# Patient Record
Sex: Male | Born: 1950 | Race: White | Hispanic: No | Marital: Married
Health system: Midwestern US, Academic
[De-identification: ages and names within clinical notes are randomized; demographics above are authoritative.]

---

## 2016-07-16 MED ORDER — MIDAZOLAM 1 MG/ML IJ SOLN
1-2 mg | Freq: Once | INTRAVENOUS | 0 refills | Status: CP
Start: 2016-07-16 — End: ?

## 2016-07-16 MED ORDER — FENTANYL CITRATE (PF) 50 MCG/ML IJ SOLN
50 ug | Freq: Once | INTRAVENOUS | 0 refills | Status: CP
Start: 2016-07-16 — End: ?

## 2016-08-09 MED ORDER — LEUPROLIDE (3 MONTH) 22.5 MG IM SYKT
22.5 mg | Freq: Once | INTRAMUSCULAR | 0 refills | Status: CP
Start: 2016-08-09 — End: ?

## 2016-09-13 ENCOUNTER — Encounter: Admit: 2016-09-13 | Discharge: 2016-09-13 | Payer: MEDICARE

## 2016-09-13 DIAGNOSIS — Z794 Long term (current) use of insulin: ICD-10-CM

## 2016-09-13 DIAGNOSIS — I252 Old myocardial infarction: ICD-10-CM

## 2016-09-13 DIAGNOSIS — G4733 Obstructive sleep apnea (adult) (pediatric): ICD-10-CM

## 2016-09-13 DIAGNOSIS — J45909 Unspecified asthma, uncomplicated: ICD-10-CM

## 2016-09-13 DIAGNOSIS — J449 Chronic obstructive pulmonary disease, unspecified: ICD-10-CM

## 2016-09-13 DIAGNOSIS — I130 Hypertensive heart and chronic kidney disease with heart failure and stage 1 through stage 4 chronic kidney disease, or unspecified chronic kidney disease: ICD-10-CM

## 2016-09-13 DIAGNOSIS — E669 Obesity, unspecified: ICD-10-CM

## 2016-09-13 DIAGNOSIS — Z955 Presence of coronary angioplasty implant and graft: ICD-10-CM

## 2016-09-13 DIAGNOSIS — I214 Non-ST elevation (NSTEMI) myocardial infarction: ICD-10-CM

## 2016-09-13 DIAGNOSIS — C61 Malignant neoplasm of prostate: Principal | ICD-10-CM

## 2016-09-13 DIAGNOSIS — I2510 Atherosclerotic heart disease of native coronary artery without angina pectoris: ICD-10-CM

## 2016-09-13 DIAGNOSIS — E1122 Type 2 diabetes mellitus with diabetic chronic kidney disease: ICD-10-CM

## 2016-09-13 DIAGNOSIS — I509 Heart failure, unspecified: ICD-10-CM

## 2016-09-13 DIAGNOSIS — E039 Hypothyroidism, unspecified: ICD-10-CM

## 2016-09-13 DIAGNOSIS — C7951 Secondary malignant neoplasm of bone: ICD-10-CM

## 2016-09-13 DIAGNOSIS — R0600 Dyspnea, unspecified: ICD-10-CM

## 2016-09-13 DIAGNOSIS — Z87891 Personal history of nicotine dependence: ICD-10-CM

## 2016-09-13 DIAGNOSIS — K219 Gastro-esophageal reflux disease without esophagitis: ICD-10-CM

## 2016-09-13 DIAGNOSIS — E785 Hyperlipidemia, unspecified: ICD-10-CM

## 2016-09-13 DIAGNOSIS — Z951 Presence of aortocoronary bypass graft: ICD-10-CM

## 2016-09-13 DIAGNOSIS — N189 Chronic kidney disease, unspecified: ICD-10-CM

## 2016-09-13 DIAGNOSIS — Z7982 Long term (current) use of aspirin: ICD-10-CM

## 2016-09-13 DIAGNOSIS — M1990 Unspecified osteoarthritis, unspecified site: ICD-10-CM

## 2016-09-13 DIAGNOSIS — I10 Essential (primary) hypertension: ICD-10-CM

## 2016-09-13 DIAGNOSIS — B0229 Other postherpetic nervous system involvement: ICD-10-CM

## 2016-09-13 DIAGNOSIS — D649 Anemia, unspecified: ICD-10-CM

## 2016-09-13 DIAGNOSIS — A4902 Methicillin resistant Staphylococcus aureus infection, unspecified site: ICD-10-CM

## 2016-09-13 DIAGNOSIS — E119 Type 2 diabetes mellitus without complications: ICD-10-CM

## 2016-09-13 LAB — COMPREHENSIVE METABOLIC PANEL
Lab: 0.5 mg/dL (ref 0.3–1.2)
Lab: 0.8 mg/dL — ABNORMAL HIGH (ref 0.4–1.24)
Lab: 100 U/L (ref 25–110)
Lab: 135 MMOL/L — ABNORMAL LOW (ref 137–147)
Lab: 15 U/L (ref 7–56)
Lab: 17 U/L — ABNORMAL HIGH (ref 7–40)
Lab: 209 mg/dL — ABNORMAL HIGH (ref 70–100)
Lab: 29 MMOL/L (ref 21–30)
Lab: 4 g/dL — ABNORMAL LOW (ref 3.5–5.0)
Lab: 4.2 MMOL/L — ABNORMAL LOW (ref 3.5–5.1)
Lab: 6.7 g/dL (ref 6.0–8.0)
Lab: 7 K/UL (ref 3–12)
Lab: 9.5 mg/dL (ref 8.5–10.6)
Lab: >60 mL/min (ref >60)
Lab: >60 mL/min (ref >60)

## 2016-09-13 LAB — TESTOSTERONE,TOTAL: Lab: 20 ng/dL — ABNORMAL LOW (ref 270–1070)

## 2016-09-13 LAB — CBC AND DIFF
Lab: 0 10*3/uL (ref 0–0.20)
Lab: 4.3 M/UL — ABNORMAL LOW (ref 4.4–5.5)
Lab: 5.9 10*3/uL (ref 4.5–11.0)

## 2016-09-13 LAB — PROSTATIC SPECIFIC ANTIGEN-PSA: Lab: 0 ng/mL (ref <4.01)

## 2016-09-13 NOTE — Progress Notes
Name: Alexander Holland          MRN: [id number]      DOB: 1950/10/27      AGE: 66 y.o.   DATE OF SERVICE: 09/13/2016    Subjective:           Reason for Visit: Metastatic prostate cancer  Heme/Onc Care    Cancer Staging  Prostate cancer [facility])  Staging form: Prostate, AJCC 7th Edition  - Clinical stage from 01/31/2016: Stage IV (T3b, N1, M1b, PSA: Less than 10, Gleason 8-10) - Signed by Ross Marcus, MD on 01/31/2016       Prostate cancer (HCC)    01/05/2016 Surgery     Transurethral resection of prostate: Pathology revealed Gleason 4+5=9 adenocarcinoma of the prostate         01/17/2016 Initial Diagnosis     Prostate cancer (HCC)       01/17/2016 Imaging     MRI pelvis: Extension of prostate tumor into mesorectal fascia and seminal vesicles, pelvic lymphadenopathy, +abnormal osseous lesions concerning for metastatic disease           01/26/2016 Imaging     Bone scan: Widespread osseous metastases in axial and appendicular skeleton         01/31/2016 -  Chemotherapy     Bicalutamide 50 mg PO daily         02/19/2016 - 06/07/2016 Chemotherapy     Docetaxel 75 mg/m2 IV q3 weeks, plan for 6 cycles.  Administered without curative intent.         02/19/2016 -  Chemotherapy     Lupron 22.5 mg IM q3 months           History of Present Illness  Alexander Holland returns for ongoing management for metastatic, hormone-sensitive prostate cancer.    He has been feeling better and more like himself as he gets further from chemotherapy. His energy is significantly better than it was even a few weeks ago and notes that his peripheral edema has resolved. No residual neuropathy. Also denies any urinary frequency, hesitancy, dysuria. Does have some dyspnea on exertion but this is moderated by use of his inhalers and doesn't limit his ADLs.    He and his wife just returned from a camping trip and they have more travel planned this summer.       Review of Systems   Constitutional: Positive for fatigue.   HENT: Negative.    Eyes: Negative. Respiratory: Positive for shortness of breath.    Cardiovascular: Negative.    Gastrointestinal: Negative.    Endocrine: Negative.    Musculoskeletal: Negative.    Skin: Negative.    Allergic/Immunologic: Negative.    Neurological: Negative.    Hematological: Negative.    Psychiatric/Behavioral: Negative.      Objective:      Past medical, surgical, family, and social history have been reviewed with the patient on 09/13/2016, and confirmed accuracy of the information outlined below:  Past Medical History:   Diagnosis Date   ??? Anemia    ??? Arthritis     DJD-Hip   ??? Asthma    ??? CAD (coronary artery disease)    ??? CHF (congestive heart failure) (HCC) 08/2015   ??? Chronic renal insufficiency    ??? COPD (chronic obstructive pulmonary disease) (HCC)    ??? DM (diabetes mellitus) (HCC)     120's,130's   ??? DOE (dyspnea on exertion)    ??? Ex-smoker    ??? GERD (gastroesophageal reflux disease)    ???  HLD (hyperlipidemia)    ??? HTN (hypertension)    ??? Hypothyroidism    ??? MRSA (methicillin resistant Staphylococcus aureus)    ??? NSTEMI (non-ST elevated myocardial infarction) (HCC)     1997, 2012   ??? Obesity    ??? OSA on CPAP    ??? Postherpetic neuralgia      Past Surgical History:   Procedure Laterality Date   ??? CORONARY STENT PLACEMENT  1997   ??? CARDIAC SURGERY  2012    CABG   ??? TURP N/A 01/05/2016    CYSTOSCOPY WITH TRANSURETHRAL PROSTATECTOMY performed by Mittie Bodo, MD at [facility]   ??? ABDOMEN SURGERY      Umbilical Hernia   ??? HX HEART CATHETERIZATION     ??? LEFT HEART CATHETERIZATION      s/p PCI ~1995     No family history on file.  Social History     Social History   ??? Marital status: Married     Spouse name: N/A   ??? Number of children: N/A   ??? Years of education: N/A     Occupational History   ??? Not on file.     Social History Main Topics   ??? Smoking status: Former Smoker     Packs/day: 1.00     Years: 30.00     Types: Cigarettes     Quit date: 02/01/1996   ??? Smokeless tobacco: Never Used   ??? Alcohol use Yes Comment: rarely   ??? Drug use: No   ??? Sexual activity: Not on file     Other Topics Concern   ??? Not on file     Social History Narrative   ??? No narrative on file   Mr. Maselli is married and lives with his wife. 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 They have 3 children (2 daughters, 1 son).        ??? ACCU-CHEK SOFTCLIX LANCETS MISC Use  as directed twice daily.     ??? acetaminophen (TYLENOL) 500 mg tablet Take 1-2 Tabs by mouth every 6 hours as needed for Pain.   ??? albuterol (VENTOLIN HFA, PROAIR HFA) 90 mcg/actuation inhaler Inhale 2 Puffs by mouth every 6 hours as needed.     ??? albuterol 0.083% (PROVENTIL; VENTOLIN) 2.5 mg /3 mL (0.083 %) nebulizer solution Inhale 3 mL solution by nebulizer as directed every 4 hours as needed for Wheezing or Shortness of Breath.   ??? aspirin EC 81 mg tablet Take 81 mg by mouth at bedtime daily. Take with food.   ??? atorvastatin (LIPITOR) 80 mg tablet Take 80 mg by mouth at bedtime daily.   ??? blood sugar diagnostic (ACCU-CHEK AVIVA PLUS) test strip 1 Strip by Test route before meals and at bedtime.     ??? carvedilol (COREG) 12.5 mg tablet Take 1 Tab by mouth twice daily. Take with food. (Patient taking differently: Take 6.25 mg by mouth twice daily. Take with food.)   ??? cholecalciferol (VITAMIN D-3) 1,000 units tablet Take 1,000 Units by mouth twice daily.   ??? insulin aspart (NOVOLOG) 100 unit/mL flexPEN Inject 10 Units into area(s) as directed three times daily with meals. (Patient taking differently: Inject 21-28 Units under the skin twice daily. Injecting 21 units in the morning and 28 units in the evening. Does not eat lunch, does not inject at noon)   ??? insulin glargine (LANTUS SOLOSTAR) 100 unit/mL (3 mL) injection PEN Inject 68 Units  into area(s) as directed at bedtime daily. (Patient taking differently: Inject 81 Units under the skin at bedtime daily.)   ??? levothyroxine (SYNTHROID) 25 mcg tablet Take 25 mcg by mouth daily. Before breakfast    ??? lisinopril (PRINIVIL, ZESTRIL) 2.5 mg tablet Take 1 tablet by mouth daily.   ??? metFORMIN (GLUCOPHAGE) 1,000 mg tablet Take 1,000 mg by mouth twice daily with meals.     ??? nitroglycerin (NITROSTAT) 0.4 mg tablet Place 0.4 mg under tongue every 5 minutes as needed.     ??? omeprazole DR(+) (PRILOSEC) 20 mg capsule Take 20 mg by mouth daily.     ??? ondansetron (ZOFRAN) 8 mg tablet Take 1 tablet by mouth every 8 hours as needed for Nausea or Vomiting. May cause constipation   ??? sertraline (ZOLOFT) 50 mg tablet Take 1 tablet by mouth daily.     Vitals:    09/13/16 1002   BP: 129/56   Temp: 36.9 ???C (98.5 ???F)   TempSrc: Oral   SpO2: 95%   Weight: 105.1 kg (231 lb 12.8 oz)   Height: 170.2 cm (67.01)     Body mass index is 36.3 kg/m???.   Pain Score: Zero   Pain Addressed:  Patient declines intervention    Patient Evaluated for a Clinical Trial: No treatment clinical trial available for this patient.     [place] Cooperative Oncology Group performance status is 1, Restricted in physically strenuous activity but ambulatory and able to carry out work of a light or sedentary nature, e.g., light house work, office work.     Physical Exam    Constitutional: Well-developed, well-nourished gentleman sitting comfortably in exam room. Appears comfortable.   Eyes: EOMI, no conjunctival injection, anicteric sclerae  ENT: Nares patent, lips and oral mucosae moist, no exudates or ulcerations. Good dentition. No stridor.  CV: Normal rate, regular rhythm, no murmur, rub, or gallop. Trace bilateral bilateral pitting edema back to trace bilaterally.   Respiratory: Normal work of breathing on room air. Good air movement throughout chest, no wheeze, rales, or rhonchi.  GI: Abdomen obese, soft, and nondistended. Bowel sounds present in all 4 quadrants. No tenderness to palpation. No hepatomegaly, spleen tip not palpated.  GU: No catheter present  Msk: Normal muscle bulk and tone. No joint deformities or arthritic abnormalities. Trace pitting edema BLE to mid-shin, symmetric.  Neuro: Cranial nerves grossly intact and symmetric. No focal neurologic deficits. Normal gait.  Integument/skin: No rash visualized nor any skin eruptions palpated.  Heme/lymph: No pathologic-appearing bruising. No cervical or supraclavicular lymphdenopathy.  Psych: Alert and oriented to person, place, date, and situation. Full affect. Good insight and judgement.   Access: Prior port-a-cath site in R chest with 0.2 cm eschar on lateral margin, otherwise well-healed    Data:  I have reviewed the patient's CBC/differential and compared to prior values. The full CBC is as follows:  CBC with Diff Latest Ref Rng & Units 09/13/2016 08/09/2016 06/28/2016 06/07/2016 05/17/2016   WBC 4.5 - 11.0 K/UL 5.9 4.7 7.1 6.1 6.0   RBC 4.4 - 5.5 M/UL 4.32(L) 3.91(L) 3.72(L) 3.62(L) 3.93(L)   HGB 13.5 - 16.5 GM/DL 11.2(L) 10.5(L) 9.7(L) 9.8(L) 10.3(L)   HCT 40 - 50 % 35.2(L) 32.3(L) 31.2(L) 30.1(L) 32.4(L)   MCV 80 - 100 FL 81.4 82.5 83.7 83.2 82.5   MCH 26 - 34 PG 26.0 26.7 26.2 27.2 26.3   MCHC 32.0 - 36.0 G/DL 31.9(L) 32.4 31.3(L) 32.7 31.9(L)   RDW 11 - 15 % 18.1(H) 18.0(H)  20.8(H) 22.4(H) 22.2(H)   PLT 150 - 400 K/UL 171 165 237 195 228   MPV 7 - 11 FL 8.0 7.9 7.6 7.7 7.2   NEUT 41 - 77 % 67 - 73 92(H) 92(H)   ANC 1.8 - 7.0 K/UL 3.90 - 5.20 5.60 5.60   LYMA 24 - 44 % 16(L) - 15(L) 7(L) 6(L)   ALYM 1.0 - 4.8 K/UL 1.00 - 1.10 0.40(L) 0.40(L)   MONA 4 - 12 % 8 - 10 1(L) 1(L)   AMONO 0 - 0.80 K/UL 0.50 - 0.70 0.10 0.10   EOSA 0 - 5 % 8(H) - 1 0 0   AEOS 0 - 0.45 K/UL 0.40 - 0.00 0.00 0.00   BASA 0 - 2 % 1 - 1 0 1   ABAS 0 - 0.20 K/UL 0.00 - 0.10 0.00 0.00       I have reviewed the patient's CMP and compared to prior values. The full CMP is as follows:  CMP Latest Ref Rng & Units 09/13/2016 08/09/2016 06/28/2016 06/07/2016 05/29/2016   NA 137 - 147 MMOL/L 135(L) 139 138 139 139   K 3.5 - 5.1 MMOL/L 4.2 3.7 4.2 4.5 4.6   CL 98 - 110 MMOL/L 99 102 100 102 100 CO2 21 - 30 MMOL/L 29 29 32(H) 25 28.0   GAP 3 - 12 7 8 6 12  16(H)   BUN 7 - 25 MG/DL 21 21 23 20  28.0(H)   CR 0.4 - 1.24 MG/DL 1.61 0.96 0.45 4.09 8.11   GLUX 70 - 100 MG/DL 914(N) 829(F) 621(H) 086(V) 137(H)   CA 8.5 - 10.6 MG/DL 9.5 9.0 9.3 8.9 9.3   TP 6.0 - 8.0 G/DL 6.7 6.5 6.2 6.1 -   ALB 3.5 - 5.0 G/DL 4.0 3.6 3.5 3.5 -   ALKP 25 - 110 U/L 100 103 84 94 -   ALT 7 - 56 U/L 15 14 9 14  -   TBILI 0.3 - 1.2 MG/DL 0.5 0.4 0.5 0.5 -   GFR >60 mL/min >60 >60 >60 >60 69.0   GFRAA >60 mL/min >60 >60 >60 >60 79.8     Lab Results   Component Value Date    PSA 0.06 09/13/2016    PSA 0.05 08/09/2016    PSA  06/28/2016     <0.10  Reference range: <=4.5  Unit: ng/mL  [facility], [address], [place] [address]      PSA 0.32 04/26/2016    PSA 4.52 (H) 02/19/2016    TESTOSTER 20 (L) 09/13/2016    TESTOSTER 23 (L) 08/09/2016    TESTOSTER <10 (L) 06/28/2016         Assessment and Plan: Mr. Kanitz is a 66 y.o. gentleman with history of coronary artery disease who presents regarding evaluation and management of metastatic prostate cancer.  1. Prostate cancer:  ???Continues to have an excellent biochemical response to combination chemohormonal therapy, PSA is 0.06, which is stable from recent values.  ???Continue q 6 week follow up through next Lupron dose on 11/01/16, after which we will continue to follow him q3 months with labs and clinical follow up.  ???I discussed my recommendations for supportive measures while receiving androgen-deprivation therapy: 1) Begin taking calcium and vitamin D at a dose of at least 500 mg of calcium per day, 2) begin weightbearing aerobic exercise (e.g. walking, jogging) as tolerated with an eventual goal of 30-45 minutes 3 times per week, and 3) begin strengthening exercises (e.g.  yoga, pilates, cautious use of weights) as tolerated with an eventual goal of 30 minutes 2 times per week. Recommendations about exercise are made with the goal of counteracting the negative effects of ADT on bone health, muscle bulk, and mood changes.  ???I informed the patient that supportive care exists for the management of sexual side effects of androgen-deprivation therapy, and informed him that my onco-urology colleagues have the greatest wealth of expertise on this topic. Offered to assist with scheduling a follow-up consultation with urology about sexual dysfunction whenever/if ever the patient would find it helpful.     2. Decrement in LVEF:  ???Following with cardiology, who is optimizing his medical management regimen for his CHF.     3. Fatigue:  ???Improving, now grade 1. Continue to monitor.    4. Goals of care:  ???Stated goal is longevity.  He stated that he is willing to accept the risk of medical burden (new symptoms, hospitalizations) in pursuit of this goal.        Total face-to-face time for visit 17 minutes, all of which was spent education and counseling with patient in clinic. Discussed the patient's cancer diagnosis and management.

## 2016-10-24 ENCOUNTER — Ambulatory Visit: Admit: 2016-10-24 | Discharge: 2016-10-25 | Payer: MEDICARE

## 2016-10-24 ENCOUNTER — Encounter: Admit: 2016-10-24 | Discharge: 2016-10-24 | Payer: MEDICARE

## 2016-10-24 DIAGNOSIS — B0229 Other postherpetic nervous system involvement: ICD-10-CM

## 2016-10-24 DIAGNOSIS — D649 Anemia, unspecified: ICD-10-CM

## 2016-10-24 DIAGNOSIS — J45909 Unspecified asthma, uncomplicated: ICD-10-CM

## 2016-10-24 DIAGNOSIS — E785 Hyperlipidemia, unspecified: ICD-10-CM

## 2016-10-24 DIAGNOSIS — N189 Chronic kidney disease, unspecified: ICD-10-CM

## 2016-10-24 DIAGNOSIS — A4902 Methicillin resistant Staphylococcus aureus infection, unspecified site: ICD-10-CM

## 2016-10-24 DIAGNOSIS — Z87891 Personal history of nicotine dependence: ICD-10-CM

## 2016-10-24 DIAGNOSIS — I509 Heart failure, unspecified: ICD-10-CM

## 2016-10-24 DIAGNOSIS — I214 Non-ST elevation (NSTEMI) myocardial infarction: ICD-10-CM

## 2016-10-24 DIAGNOSIS — M1990 Unspecified osteoarthritis, unspecified site: ICD-10-CM

## 2016-10-24 DIAGNOSIS — K219 Gastro-esophageal reflux disease without esophagitis: ICD-10-CM

## 2016-10-24 DIAGNOSIS — J449 Chronic obstructive pulmonary disease, unspecified: ICD-10-CM

## 2016-10-24 DIAGNOSIS — E119 Type 2 diabetes mellitus without complications: ICD-10-CM

## 2016-10-24 DIAGNOSIS — E039 Hypothyroidism, unspecified: ICD-10-CM

## 2016-10-24 DIAGNOSIS — R0600 Dyspnea, unspecified: ICD-10-CM

## 2016-10-24 DIAGNOSIS — I2510 Atherosclerotic heart disease of native coronary artery without angina pectoris: Principal | ICD-10-CM

## 2016-10-24 DIAGNOSIS — I10 Essential (primary) hypertension: ICD-10-CM

## 2016-10-24 DIAGNOSIS — E669 Obesity, unspecified: ICD-10-CM

## 2016-10-24 DIAGNOSIS — R0602 Shortness of breath: Principal | ICD-10-CM

## 2016-10-24 DIAGNOSIS — G4733 Obstructive sleep apnea (adult) (pediatric): ICD-10-CM

## 2016-10-24 NOTE — Progress Notes
Date of Service: 10/24/2016    Alexander Holland is a 67 y.o. male.       HPI     I saw 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  His ejection fraction has improved from 30% to more than 40% on our last imaging data.  He is on Coreg therapy and guideline-mediated therapy.  He is not complaining of orthopnea, PND, edema, palpitations, lightheadedness, syncope or chest pain     He is planning a kayaking trip in the near future, and although he has sleep apnea, he sleeps well otherwise.  He tells me that he is well-adapted to taking his numerous medications and is very compliant and adherent.    ([id number])           Vitals:    10/24/16 1005   BP: 126/64   Pulse: 70   Weight: 106.1 kg (234 lb)   Height: 1.702 m (5' 7)     Body mass index is 36.65 kg/m???.     Past Medical History  Patient Active Problem List    Diagnosis Date Noted   ??? HFrEF (heart failure with reduced ejection fraction) (HCC) 06/20/2016   ??? Edema 06/20/2016   ??? Prostate cancer (HCC) 01/17/2016     -  Gleason 4+5=9 prostate cancer on TURP from 01/05/2016.  MRI with bulky pelvic metastatic disease    ABDOMEN:  1. Normal sized upper abdominal lymph nodes, which are nonspecific and could represent reactive lymph nodes or nodal metastatic disease.  2. Extensive osseous lesions throughout the thoracolumbar spine, compatible with osseous metastatic disease. Correlate with ordered bone scan to evaluate extent of disease.    PELVIS:  1. Complete replacement of the prostate gland by tumor with extraprostatic extension to the mesorectal fascia and the seminal vesicles. Nodular tumor extends to abut the bladder base.  2. Extensive of pelvic lymphadenopathy compatible with nodal metastatic disease.  3. Extensive osseous metastatic disease. ???      Bone scan with diffuse metastatic disease:    Widespread osseous metastatic disease.     ??? BPH (benign prostatic hyperplasia) 01/05/2016 ??? BPH with obstruction/lower urinary tract symptoms 10/18/2015     10/18/15 for benign prostatic hypertrophy.  Currently on Flomax and finasteride with an episode of urinary retention in May.  He has been on catheter dependent since that time.  Currently being evaluated with transrectal ultrasound at the [facility] on July 12.  Large prostate approximately 45 g.  Last PSA was approximately 2    TRUS volume at the [place] which was 37g.     UDS (12/14/15): small capacity, retention; poor compliance and BOO.    01/05/16 bipolar TURP     ??? Respiratory failure (HCC) 02/15/2011   ??? Sepsis(995.91) 02/15/2011   ??? Pneumonia 02/15/2011   ??? HTN (hypertension) 02/02/2011   ??? HLD (hyperlipidemia) 02/02/2011   ??? CAD (coronary artery disease) 02/02/2011   ??? NSTEMI (non-ST elevated myocardial infarction) (HCC) 02/02/2011   ??? DM type 2 (diabetes mellitus, type 2) (HCC) 02/02/2011   ??? Hypothyroidism 02/02/2011         Review of Systems   Constitution: Negative.   HENT: Positive for tinnitus.    Eyes: Negative.    Cardiovascular: Positive for dyspnea on exertion.   Respiratory: Positive for shortness of breath.    Endocrine: Negative.    Hematologic/Lymphatic: Negative.    Skin: Negative.    Musculoskeletal: Negative.    Gastrointestinal: Negative.    Genitourinary: Negative.  Neurological: Negative.    Psychiatric/Behavioral: Negative.    Allergic/Immunologic: Positive for environmental allergies.       Physical Exam  Examination reveals an obese 66 year old gentleman in no acute distress.  His head and neck exam is benign.  He is slightly hard of hearing.  Lung sounds are clear without rhonchi, rales, wheezes or forced expiratory wheezing.  Heart tones are regular.  I do not detect a pathologic murmur or gallop.  Abdomen is obese.  He has no peripheral edema, cyanosis, or clubbing.  Neurologic exam is grossly intact.  Skin exam is benign.  Affect is normal.  Gait is normal.  Muscle strength is normal.    ([id number]) Cardiovascular Studies  A 12-lead electrocardiogram shows normal sinus rhythm.    ([id number])        Problems Addressed Today  No diagnosis found.    Assessment and Plan     In summary, this is a 66 year old gentleman, an insulin-dependent diabetic, with post chemotherapy heart failure cardiomyopathic process.  Doing well on his current medical regimen.  I am going to probably recheck his ejection fraction when he returns to see [place] in November.  Otherwise, I have left his meds unchanged.    ([id number])             Current Medications (including today's revisions)  ??? ACCU-CHEK SOFTCLIX LANCETS MISC Use  as directed twice daily.     ??? acetaminophen (TYLENOL) 500 mg tablet Take 1-2 Tabs by mouth every 6 hours as needed for Pain.   ??? albuterol (VENTOLIN HFA, PROAIR HFA) 90 mcg/actuation inhaler Inhale 2 Puffs by mouth every 6 hours as needed.     ??? albuterol 0.083% (PROVENTIL; VENTOLIN) 2.5 mg /3 mL (0.083 %) nebulizer solution Inhale 3 mL solution by nebulizer as directed every 4 hours as needed for Wheezing or Shortness of Breath.   ??? aspirin EC 81 mg tablet Take 81 mg by mouth at bedtime daily. Take with food.   ??? atorvastatin (LIPITOR) 80 mg tablet Take 80 mg by mouth at bedtime daily.   ??? blood sugar diagnostic (ACCU-CHEK AVIVA PLUS) test strip 1 Strip by Test route before meals and at bedtime.     ??? carvedilol (COREG) 12.5 mg tablet Take 1 Tab by mouth twice daily. Take with food. (Patient taking differently: Take 6.25 mg by mouth twice daily. Take with food.)   ??? cholecalciferol (VITAMIN D-3) 1,000 units tablet Take 1,000 Units by mouth twice daily.   ??? insulin aspart (NOVOLOG) 100 unit/mL flexPEN Inject 10 Units into area(s) as directed three times daily with meals. (Patient taking differently: Inject 21-28 Units under the skin twice daily. Injecting 21 units in the morning and 28 units in the evening. Does not eat lunch, does not inject at noon) ??? insulin glargine (LANTUS SOLOSTAR) 100 unit/mL (3 mL) injection PEN Inject 68 Units into area(s) as directed at bedtime daily. (Patient taking differently: Inject 81 Units under the skin at bedtime daily.)   ??? levothyroxine (SYNTHROID) 25 mcg tablet Take 25 mcg by mouth daily. Before breakfast    ??? lisinopril (PRINIVIL, ZESTRIL) 2.5 mg tablet Take 1 tablet by mouth daily.   ??? metFORMIN (GLUCOPHAGE) 1,000 mg tablet Take 1,000 mg by mouth twice daily with meals.     ??? nitroglycerin (NITROSTAT) 0.4 mg tablet Place 0.4 mg under tongue every 5 minutes as needed.     ??? omeprazole DR(+) (PRILOSEC) 20 mg capsule Take 20 mg by mouth daily.     ???  ondansetron (ZOFRAN) 8 mg tablet Take 1 tablet by mouth every 8 hours as needed for Nausea or Vomiting. May cause constipation   ??? sertraline (ZOLOFT) 50 mg tablet Take 1 tablet by mouth daily.

## 2016-10-31 ENCOUNTER — Encounter: Admit: 2016-10-31 | Discharge: 2016-10-31 | Payer: MEDICARE

## 2016-10-31 DIAGNOSIS — C61 Malignant neoplasm of prostate: Principal | ICD-10-CM

## 2016-11-01 ENCOUNTER — Encounter: Admit: 2016-11-01 | Discharge: 2016-11-01 | Payer: MEDICARE

## 2016-11-01 DIAGNOSIS — I130 Hypertensive heart and chronic kidney disease with heart failure and stage 1 through stage 4 chronic kidney disease, or unspecified chronic kidney disease: ICD-10-CM

## 2016-11-01 DIAGNOSIS — Z87891 Personal history of nicotine dependence: ICD-10-CM

## 2016-11-01 DIAGNOSIS — N189 Chronic kidney disease, unspecified: ICD-10-CM

## 2016-11-01 DIAGNOSIS — E1122 Type 2 diabetes mellitus with diabetic chronic kidney disease: ICD-10-CM

## 2016-11-01 DIAGNOSIS — C775 Secondary and unspecified malignant neoplasm of intrapelvic lymph nodes: ICD-10-CM

## 2016-11-01 DIAGNOSIS — C7951 Secondary malignant neoplasm of bone: ICD-10-CM

## 2016-11-01 DIAGNOSIS — I509 Heart failure, unspecified: ICD-10-CM

## 2016-11-01 DIAGNOSIS — J449 Chronic obstructive pulmonary disease, unspecified: ICD-10-CM

## 2016-11-01 DIAGNOSIS — R5383 Other fatigue: ICD-10-CM

## 2016-11-01 DIAGNOSIS — I2510 Atherosclerotic heart disease of native coronary artery without angina pectoris: ICD-10-CM

## 2016-11-01 DIAGNOSIS — Z79818 Long term (current) use of other agents affecting estrogen receptors and estrogen levels: ICD-10-CM

## 2016-11-01 DIAGNOSIS — C61 Malignant neoplasm of prostate: Principal | ICD-10-CM

## 2016-11-01 DIAGNOSIS — E785 Hyperlipidemia, unspecified: ICD-10-CM

## 2016-11-01 LAB — COMPREHENSIVE METABOLIC PANEL
Lab: 0.5 mg/dL (ref 0.3–1.2)
Lab: 1.1 mg/dL — ABNORMAL HIGH (ref 0.4–1.24)
Lab: 10 K/UL — ABNORMAL LOW (ref 3–12)
Lab: 104 U/L (ref 25–110)
Lab: 136 MMOL/L — ABNORMAL LOW (ref 137–147)
Lab: 21 U/L (ref 7–56)
Lab: 22 U/L — ABNORMAL HIGH (ref 7–40)
Lab: 26 mg/dL — ABNORMAL HIGH (ref 7–25)
Lab: 260 mg/dL — ABNORMAL HIGH (ref 70–100)
Lab: 27 MMOL/L (ref 21–30)
Lab: 4 g/dL — ABNORMAL LOW (ref 3.5–5.0)
Lab: 4.7 MMOL/L — ABNORMAL LOW (ref 3.5–5.1)
Lab: 6.8 g/dL (ref 6.0–8.0)
Lab: 9.6 mg/dL (ref 8.5–10.6)
Lab: >60 mL/min (ref >60)
Lab: >60 mL/min — ABNORMAL HIGH (ref >60)

## 2016-11-01 LAB — PROSTATIC SPECIFIC ANTIGEN-PSA: Lab: 0 ng/mL (ref <4.01)

## 2016-11-01 LAB — CBC AND DIFF
Lab: 0.1 10*3/uL (ref 0–0.20)
Lab: 4.3 M/UL — ABNORMAL LOW (ref 4.4–5.5)
Lab: 5.5 10*3/uL (ref 4.5–11.0)

## 2016-11-01 MED ORDER — LEUPROLIDE (3 MONTH) 22.5 MG IM SYKT
22.5 mg | Freq: Once | INTRAMUSCULAR | 0 refills | Status: CN
Start: 2016-11-01 — End: ?

## 2016-11-01 MED ORDER — LEUPROLIDE (3 MONTH) 22.5 MG IM SYKT
22.5 mg | Freq: Once | INTRAMUSCULAR | 0 refills | Status: CP
Start: 2016-11-01 — End: ?
  Administered 2016-11-01: 18:00:00 22.5 mg via INTRAMUSCULAR

## 2016-11-01 NOTE — Progress Notes
Name: Alexander Holland          MRN: [id number]      DOB: 02-08-1951      AGE: 66 y.o.   DATE OF SERVICE: 11/01/2016    Subjective:           Reason for Visit: Metastatic prostate cancer  Heme/Onc Care    Cancer Staging  Prostate cancer [facility])  Staging form: Prostate, AJCC 7th Edition  - Clinical stage from 01/31/2016: Stage IV (T3b, N1, M1b, PSA: Less than 10, Gleason 8-10) - Signed by Ross Marcus, MD on 01/31/2016       Prostate cancer (HCC)    01/05/2016 Surgery     Transurethral resection of prostate: Pathology revealed Gleason 4+5=9 adenocarcinoma of the prostate         01/17/2016 Initial Diagnosis     Prostate cancer (HCC)       01/17/2016 Imaging     MRI pelvis: Extension of prostate tumor into mesorectal fascia and seminal vesicles, pelvic lymphadenopathy, +abnormal osseous lesions concerning for metastatic disease           01/26/2016 Imaging     Bone scan: Widespread osseous metastases in axial and appendicular skeleton         01/31/2016 -  Chemotherapy     Bicalutamide 50 mg PO daily         02/19/2016 - 06/07/2016 Chemotherapy     Docetaxel 75 mg/m2 IV q3 weeks, plan for 6 cycles.  Administered without curative intent.         02/19/2016 -  Chemotherapy     Lupron 22.5 mg IM q3 months           History of Present Illness  [name] returns for ongoing management for metastatic, hormone-sensitive prostate cancer.    He says that he has been feeling quite well lately, dramatically better than when he was receiving chemotherapy.  His energy is improved and dyspnea on exertion is minimal now.  He has been working on getting a kayak ready to take a small boat trip on the [place], although his family is not enthusiastic about him doing so, as they worried he might get hurt.  He has been going fishing and enjoying his time, even taking his grandchildren out and teaching them to fish, which was very meaningful to him.    He denies any significant pain and denies any urinary frequency, urgency, or hesitancy.  He has not noticed any hot flashes or night sweats, and his mood has been good.       Review of Systems   Constitutional: Positive for fatigue.   HENT: Negative.    Eyes: Negative.    Respiratory: Positive for shortness of breath.    Cardiovascular: Negative.    Gastrointestinal: Negative.    Endocrine: Negative.    Musculoskeletal: Negative.    Skin: Negative.    Allergic/Immunologic: Negative.    Neurological: Negative.    Hematological: Negative.    Psychiatric/Behavioral: Negative.      Objective:      Past medical, surgical, family, and social history have been reviewed with the patient on 11/01/2016, and confirmed accuracy of the information outlined below:  Past Medical History:   Diagnosis Date   ??? Anemia    ??? Arthritis     DJD-Hip   ??? Asthma    ??? CAD (coronary artery disease)    ??? CHF (congestive heart failure) (HCC) 08/2015   ??? Chronic renal insufficiency    ???  COPD (chronic obstructive pulmonary disease) (HCC)    ??? DM (diabetes mellitus) (HCC)     120's,130's   ??? DOE (dyspnea on exertion)    ??? Ex-smoker    ??? GERD (gastroesophageal reflux disease)    ??? HLD (hyperlipidemia)    ??? HTN (hypertension)    ??? Hypothyroidism    ??? MRSA (methicillin resistant Staphylococcus aureus)    ??? NSTEMI (non-ST elevated myocardial infarction) (HCC)     1997, 2012   ??? Obesity    ??? OSA on CPAP    ??? Postherpetic neuralgia      Past Surgical History:   Procedure Laterality Date   ??? CORONARY STENT PLACEMENT  1997   ??? CARDIAC SURGERY  2012    CABG   ??? TURP N/A 01/05/2016    CYSTOSCOPY WITH TRANSURETHRAL PROSTATECTOMY performed by Mittie Bodo, MD at [facility]   ??? ABDOMEN SURGERY      Umbilical Hernia   ??? HX HEART CATHETERIZATION     ??? LEFT HEART CATHETERIZATION      s/p PCI ~1995     No family history on file.  Social History     Social History   ??? Marital status: Married     Spouse name: N/A   ??? Number of children: N/A   ??? Years of education: N/A     Occupational History   ??? Not on file. Social History Main Topics   ??? Smoking status: Former Smoker     Packs/day: 1.00     Years: 30.00     Types: Cigarettes     Quit date: 02/01/1996   ??? Smokeless tobacco: Never Used   ??? Alcohol use Yes      Comment: rarely   ??? Drug use: No   ??? Sexual activity: Not on file     Other Topics Concern   ??? Not on file     Social History Narrative   ??? No narrative on file   [name] is married and lives with his wife. 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 They have 3 children (2 daughters, 1 son).        ??? ACCU-CHEK SOFTCLIX LANCETS MISC Use  as directed twice daily.     ??? acetaminophen (TYLENOL) 500 mg tablet Take 1-2 Tabs by mouth every 6 hours as needed for Pain.   ??? albuterol (VENTOLIN HFA, PROAIR HFA) 90 mcg/actuation inhaler Inhale 2 Puffs by mouth every 6 hours as needed.     ??? albuterol 0.083% (PROVENTIL; VENTOLIN) 2.5 mg /3 mL (0.083 %) nebulizer solution Inhale 3 mL solution by nebulizer as directed every 4 hours as needed for Wheezing or Shortness of Breath.   ??? aspirin EC 81 mg tablet Take 81 mg by mouth at bedtime daily. Take with food.   ??? atorvastatin (LIPITOR) 80 mg tablet Take 80 mg by mouth at bedtime daily.   ??? blood sugar diagnostic (ACCU-CHEK AVIVA PLUS) test strip 1 Strip by Test route before meals and at bedtime.     ??? carvedilol (COREG) 12.5 mg tablet Take 1 Tab by mouth twice daily. Take with food. (Patient taking differently: Take 6.25 mg by mouth twice daily. Take with food.)   ??? cholecalciferol (VITAMIN D-3) 1,000 units tablet Take 1,000 Units by mouth twice daily.   ??? insulin aspart (NOVOLOG) 100 unit/mL flexPEN Inject 10 Units into area(s) as directed three times daily with meals. (Patient taking differently: Inject  21-28 Units under the skin twice daily. Injecting 21 units in the morning and 28 units in the evening. Does not eat lunch, does not inject at noon)   ??? insulin glargine (LANTUS SOLOSTAR) 100 unit/mL (3 mL) injection PEN Inject 68 Units into area(s) as directed at bedtime daily. (Patient taking differently: Inject 81 Units under the skin at bedtime daily.)   ??? levothyroxine (SYNTHROID) 25 mcg tablet Take 25 mcg by mouth daily. Before breakfast    ??? lisinopril (PRINIVIL, ZESTRIL) 2.5 mg tablet Take 1 tablet by mouth daily.   ??? metFORMIN (GLUCOPHAGE) 1,000 mg tablet Take 1,000 mg by mouth twice daily with meals.     ??? nitroglycerin (NITROSTAT) 0.4 mg tablet Place 0.4 mg under tongue every 5 minutes as needed.     ??? omeprazole DR(+) (PRILOSEC) 20 mg capsule Take 20 mg by mouth daily.     ??? ondansetron (ZOFRAN) 8 mg tablet Take 1 tablet by mouth every 8 hours as needed for Nausea or Vomiting. May cause constipation   ??? sertraline (ZOLOFT) 50 mg tablet Take 1 tablet by mouth daily.     Vitals:    11/01/16 1140   BP: 108/56   Pulse: 72   Resp: 16   Temp: 37.1 ???C (98.7 ???F)   TempSrc: Oral   SpO2: 94%   Weight: 107.4 kg (236 lb 12.8 oz)   Height: 169.4 cm (66.69)     Body mass index is 37.43 kg/m???.   Pain Score: Zero   Pain Addressed:  Patient declines intervention    Patient Evaluated for a Clinical Trial: No treatment clinical trial available for this patient.     [place] Cooperative Oncology Group performance status is 1, Restricted in physically strenuous activity but ambulatory and able to carry out work of a light or sedentary nature, e.g., light house work, office work.     Physical Exam  Constitutional: Well-developed, well-nourished gentleman sitting comfortably in exam room. Appears comfortable.   Eyes: EOMI, no conjunctival injection, anicteric sclerae  ENT: Nares patent, lips and oral mucosae moist, no exudates or ulcerations. Good dentition. No stridor.  CV: Normal rate, regular rhythm, no murmur, rub, or gallop. Trace bilateral bilateral pitting edema back to trace bilaterally.   Respiratory: Normal work of breathing on room air. Good air movement throughout chest, no wheeze, rales, or rhonchi. GI: Abdomen obese, soft, and nondistended. Bowel sounds present in all 4 quadrants. No tenderness to palpation. No hepatomegaly, spleen tip not palpated.  GU: No catheter present  Msk: Normal muscle bulk and tone. No joint deformities or arthritic abnormalities. Trace pitting edema BLE (stable)  Neuro: Cranial nerves grossly intact and symmetric. No focal neurologic deficits. Normal gait.  Integument/skin: No rash visualized nor any skin eruptions palpated.  Heme/lymph: No pathologic-appearing bruising. No cervical or supraclavicular lymphdenopathy.  Psych: Alert and oriented to person, place, date, and situation. Affect is full and content. Good insight and judgement.   Access: Prior port-a-cath site in R chest with 0.2 cm eschar on lateral margin, otherwise well-healed        Data:  I have reviewed the patient's CBC/differential and compared to prior values. The full CBC is as follows:  CBC with Diff Latest Ref Rng & Units 11/01/2016 09/13/2016 08/09/2016 06/28/2016 06/07/2016   WBC 4.5 - 11.0 K/UL 5.5 5.9 4.7 7.1 6.1   RBC 4.4 - 5.5 M/UL 4.35(L) 4.32(L) 3.91(L) 3.72(L) 3.62(L)   HGB 13.5 - 16.5 GM/DL 11.8(L) 11.2(L) 10.5(L) 9.7(L) 9.8(L)   HCT  40 - 50 % 36.3(L) 35.2(L) 32.3(L) 31.2(L) 30.1(L)   MCV 80 - 100 FL 83.3 81.4 82.5 83.7 83.2   MCH 26 - 34 PG 27.0 26.0 26.7 26.2 27.2   MCHC 32.0 - 36.0 G/DL 16.1 31.9(L) 32.4 31.3(L) 32.7   RDW 11 - 15 % 18.2(H) 18.1(H) 18.0(H) 20.8(H) 22.4(H)   PLT 150 - 400 K/UL 182 171 165 237 195   MPV 7 - 11 FL 8.0 8.0 7.9 7.6 7.7   NEUT 41 - 77 % 68 67 - 73 92(H)   ANC 1.8 - 7.0 K/UL 3.80 3.90 - 5.20 5.60   LYMA 24 - 44 % 14(L) 16(L) - 15(L) 7(L)   ALYM 1.0 - 4.8 K/UL 0.80(L) 1.00 - 1.10 0.40(L)   MONA 4 - 12 % 8 8 - 10 1(L)   AMONO 0 - 0.80 K/UL 0.40 0.50 - 0.70 0.10   EOSA 0 - 5 % 9(H) 8(H) - 1 0   AEOS 0 - 0.45 K/UL 0.50(H) 0.40 - 0.00 0.00   BASA 0 - 2 % 1 1 - 1 0   ABAS 0 - 0.20 K/UL 0.10 0.00 - 0.10 0.00       I have reviewed the patient's CMP and compared to prior values. The full CMP is as follows:  CMP Latest Ref Rng & Units 11/01/2016 09/13/2016 08/09/2016 06/28/2016 06/07/2016   NA 137 - 147 MMOL/L 136(L) 135(L) 139 138 139   K 3.5 - 5.1 MMOL/L 4.7 4.2 3.7 4.2 4.5   CL 98 - 110 MMOL/L 99 99 102 100 102   CO2 21 - 30 MMOL/L 27 29 29  32(H) 25   GAP 3 - 12 10 7 8 6 12    BUN 7 - 25 MG/DL 09(U) 21 21 23 20    CR 0.4 - 1.24 MG/DL 0.45 4.09 8.11 9.14 7.82   GLUX 70 - 100 MG/DL 956(O) 130(Q) 657(Q) 469(G) 280(H)   CA 8.5 - 10.6 MG/DL 9.6 9.5 9.0 9.3 8.9   TP 6.0 - 8.0 G/DL 6.8 6.7 6.5 6.2 6.1   ALB 3.5 - 5.0 G/DL 4.0 4.0 3.6 3.5 3.5   ALKP 25 - 110 U/L 104 100 103 84 94   ALT 7 - 56 U/L 21 15 14 9 14    TBILI 0.3 - 1.2 MG/DL 0.5 0.5 0.4 0.5 0.5   GFR >60 mL/min >60 >60 >60 >60 >60   GFRAA >60 mL/min >60 >60 >60 >60 >60     Lab Results   Component Value Date    PSA 0.06 11/01/2016    PSA 0.06 09/13/2016    PSA 0.05 08/09/2016    PSA  06/28/2016     <0.10  Reference range: <=4.5  Unit: ng/mL  [facility], [address], [place] [address]      PSA 0.32 04/26/2016    PSA 4.52 (H) 02/19/2016    TESTOSTER 20 (L) 09/13/2016    TESTOSTER 23 (L) 08/09/2016    TESTOSTER <10 (L) 06/28/2016          Assessment and Plan: [name] is a 66 y.o. gentleman with history of coronary artery disease who presents regarding evaluation and management of metastatic prostate cancer.  1. Prostate cancer:  ???Continues to have an excellent biochemical response to combination chemohormonal therapy (now on Lupron alone), PSA is 0.06, which is stable from recent values.  ???Will space out follow up to q3 months to coincide with Lupron injections. Will shorten interval when/if PSA starts  rising.   ???I discussed my recommendations for supportive measures while receiving androgen-deprivation therapy: 1) Begin taking calcium and vitamin D at a dose of at least 500 mg of calcium per day, 2) begin weightbearing aerobic exercise (e.g. walking, jogging) as tolerated with an eventual goal of 30-45 minutes 3 times per week, and 3) begin strengthening exercises (e.g. yoga, pilates, cautious use of weights) as tolerated with an eventual goal of 30 minutes 2 times per week. Recommendations about exercise are made with the goal of counteracting the negative effects of ADT on bone health, muscle bulk, and mood changes.  ???I informed the patient that supportive care exists for the management of sexual side effects of androgen-deprivation therapy, and informed him that my onco-urology colleagues have the greatest wealth of expertise on this topic. Offered to assist with scheduling a follow-up consultation with urology about sexual dysfunction whenever/if ever the patient would find it helpful.     2. Decrement in LVEF:  ???Following closely with cardiology, appreciate their management.    3. Fatigue:  ???Improving, now grade 1. Continue to monitor.    4. Goals of care:  ???Stated goal is longevity.  He stated that he is willing to accept the risk of medical burden (new symptoms, hospitalizations) in pursuit of this goal.        Total face-to-face time for visit 6 minutes, all of which was spent education and counseling with patient in clinic. Discussed the patient's cancer diagnosis and management.

## 2016-11-01 NOTE — Progress Notes
Patient received Lupron and tolerated without difficulty.  No pertinent changes since last assessment. Denies need for AVS and he left ambulatory.

## 2017-01-24 ENCOUNTER — Encounter: Admit: 2017-01-24 | Discharge: 2017-01-24 | Payer: MEDICARE

## 2017-01-24 DIAGNOSIS — E039 Hypothyroidism, unspecified: ICD-10-CM

## 2017-01-24 DIAGNOSIS — C775 Secondary and unspecified malignant neoplasm of intrapelvic lymph nodes: ICD-10-CM

## 2017-01-24 DIAGNOSIS — I10 Essential (primary) hypertension: ICD-10-CM

## 2017-01-24 DIAGNOSIS — E1122 Type 2 diabetes mellitus with diabetic chronic kidney disease: ICD-10-CM

## 2017-01-24 DIAGNOSIS — I130 Hypertensive heart and chronic kidney disease with heart failure and stage 1 through stage 4 chronic kidney disease, or unspecified chronic kidney disease: ICD-10-CM

## 2017-01-24 DIAGNOSIS — G4733 Obstructive sleep apnea (adult) (pediatric): ICD-10-CM

## 2017-01-24 DIAGNOSIS — J45909 Unspecified asthma, uncomplicated: ICD-10-CM

## 2017-01-24 DIAGNOSIS — N189 Chronic kidney disease, unspecified: ICD-10-CM

## 2017-01-24 DIAGNOSIS — R0600 Dyspnea, unspecified: ICD-10-CM

## 2017-01-24 DIAGNOSIS — R9721 Rising PSA following treatment for malignant neoplasm of prostate: ICD-10-CM

## 2017-01-24 DIAGNOSIS — M1990 Unspecified osteoarthritis, unspecified site: ICD-10-CM

## 2017-01-24 DIAGNOSIS — C7951 Secondary malignant neoplasm of bone: ICD-10-CM

## 2017-01-24 DIAGNOSIS — E785 Hyperlipidemia, unspecified: ICD-10-CM

## 2017-01-24 DIAGNOSIS — Z951 Presence of aortocoronary bypass graft: ICD-10-CM

## 2017-01-24 DIAGNOSIS — I2510 Atherosclerotic heart disease of native coronary artery without angina pectoris: Principal | ICD-10-CM

## 2017-01-24 DIAGNOSIS — E119 Type 2 diabetes mellitus without complications: ICD-10-CM

## 2017-01-24 DIAGNOSIS — R5383 Other fatigue: ICD-10-CM

## 2017-01-24 DIAGNOSIS — Z87891 Personal history of nicotine dependence: ICD-10-CM

## 2017-01-24 DIAGNOSIS — B0229 Other postherpetic nervous system involvement: ICD-10-CM

## 2017-01-24 DIAGNOSIS — I509 Heart failure, unspecified: ICD-10-CM

## 2017-01-24 DIAGNOSIS — K219 Gastro-esophageal reflux disease without esophagitis: ICD-10-CM

## 2017-01-24 DIAGNOSIS — A4902 Methicillin resistant Staphylococcus aureus infection, unspecified site: ICD-10-CM

## 2017-01-24 DIAGNOSIS — E669 Obesity, unspecified: ICD-10-CM

## 2017-01-24 DIAGNOSIS — J449 Chronic obstructive pulmonary disease, unspecified: ICD-10-CM

## 2017-01-24 DIAGNOSIS — Z79818 Long term (current) use of other agents affecting estrogen receptors and estrogen levels: ICD-10-CM

## 2017-01-24 DIAGNOSIS — C61 Malignant neoplasm of prostate: Principal | ICD-10-CM

## 2017-01-24 DIAGNOSIS — D649 Anemia, unspecified: ICD-10-CM

## 2017-01-24 DIAGNOSIS — I214 Non-ST elevation (NSTEMI) myocardial infarction: ICD-10-CM

## 2017-01-24 LAB — TESTOSTERONE,TOTAL: Lab: 21 ng/dL — ABNORMAL LOW (ref 270–1070)

## 2017-01-24 LAB — PROSTATIC SPECIFIC ANTIGEN-PSA: Lab: 0.1 ng/mL (ref <4.01)

## 2017-01-24 MED ORDER — LEUPROLIDE (3 MONTH) 22.5 MG IM SYKT
22.5 mg | Freq: Once | INTRAMUSCULAR | 0 refills | Status: CP
Start: 2017-01-24 — End: ?
  Administered 2017-01-24: 18:00:00 22.5 mg via INTRAMUSCULAR

## 2017-01-24 NOTE — Progress Notes
Patient received Lupron and tolerated without difficulty.  No pertinent changes since last assessment.

## 2017-01-24 NOTE — Progress Notes
Name: Alexander Holland          MRN: [id number]      DOB: 1951/01/10      AGE: 66 y.o.   DATE OF SERVICE: 01/24/2017    Subjective:           Reason for Visit: Metastatic prostate cancer  Prostate Cancer    Cancer Staging  Prostate cancer [facility])  Staging form: Prostate, AJCC 7th Edition  - Clinical stage from 01/31/2016: Stage IV (T3b, N1, M1b, PSA: Less than 10, Gleason 8-10) - Signed by Ross Marcus, MD on 01/31/2016       Prostate cancer (HCC)    01/05/2016 Surgery     Transurethral resection of prostate: Pathology revealed Gleason 4+5=9 adenocarcinoma of the prostate         01/17/2016 Initial Diagnosis     Prostate cancer (HCC)       01/17/2016 Imaging     MRI pelvis: Extension of prostate tumor into mesorectal fascia and seminal vesicles, pelvic lymphadenopathy, +abnormal osseous lesions concerning for metastatic disease           01/26/2016 Imaging     Bone scan: Widespread osseous metastases in axial and appendicular skeleton         01/31/2016 -  Chemotherapy     Bicalutamide 50 mg PO daily         02/19/2016 - 06/07/2016 Chemotherapy     Docetaxel 75 mg/m2 IV q3 weeks, plan for 6 cycles.  Administered without curative intent.         02/19/2016 -  Chemotherapy     Lupron 22.5 mg IM q3 months           History of Present Illness  Alexander Holland returns for ongoing management for metastatic, hormone-sensitive prostate cancer.    Since his last visit, Alexander Holland has been feeling pretty good. He did carry out his plan of kayaking on the river, and he went 27 miles from [place], [place] to [place] He is looking forward to doing it again.    He denies any other new or concerning symptoms. He denies any hot flashes, night sweats, or mood changes. 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 They are going to her family reunion in a few weeks.        Review of Systems   Constitutional: Positive for fatigue (minimal).   HENT: Negative. Eyes: Negative.    Respiratory: Positive for shortness of breath (on exertion, mild).    Cardiovascular: Negative.  Negative for leg swelling.   Gastrointestinal: Negative.    Endocrine: Negative.    Musculoskeletal: Negative.    Skin: Negative.    Allergic/Immunologic: Negative.    Neurological: Negative.    Hematological: Negative.    Psychiatric/Behavioral: Negative.      Objective:      Past medical, surgical, family, and social history have been reviewed with the patient on 01/24/2017, and confirmed accuracy of the information outlined below:  Past Medical History:   Diagnosis Date   ??? Anemia    ??? Arthritis     DJD-Hip   ??? Asthma    ??? CAD (coronary artery disease)    ??? CHF (congestive heart failure) (HCC) 08/2015   ??? Chronic renal insufficiency    ??? COPD (chronic obstructive pulmonary disease) (HCC)    ??? DM (diabetes mellitus) (HCC)     120's,130's   ??? DOE (  dyspnea on exertion)    ??? Ex-smoker    ??? GERD (gastroesophageal reflux disease)    ??? HLD (hyperlipidemia)    ??? HTN (hypertension)    ??? Hypothyroidism    ??? MRSA (methicillin resistant Staphylococcus aureus)    ??? NSTEMI (non-ST elevated myocardial infarction) (HCC)     1997, 2012   ??? Obesity    ??? OSA on CPAP    ??? Postherpetic neuralgia      Past Surgical History:   Procedure Laterality Date   ??? CORONARY STENT PLACEMENT  1997   ??? CARDIAC SURGERY  2012    CABG   ??? TURP N/A 01/05/2016    CYSTOSCOPY WITH TRANSURETHRAL PROSTATECTOMY performed by Mittie Bodo, MD at [facility]   ??? ABDOMEN SURGERY      Umbilical Hernia   ??? HX HEART CATHETERIZATION     ??? LEFT HEART CATHETERIZATION      s/p PCI ~1995     No family history on file.  Social History     Social History   ??? Marital status: Married     Spouse name: N/A   ??? Number of children: N/A   ??? Years of education: N/A     Occupational History   ??? Not on file.     Social History Main Topics   ??? Smoking status: Former Smoker     Packs/day: 1.00     Years: 30.00     Types: Cigarettes     Quit date: 02/01/1996 ??? Smokeless tobacco: Never Used   ??? Alcohol use Yes      Comment: rarely   ??? Drug use: No   ??? Sexual activity: Not on file     Other Topics Concern   ??? Not on file     Social History Narrative   ??? No narrative on file   Alexander Holland is married and lives with his wife. 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 They have 3 children (2 daughters, 1 son).        ??? ACCU-CHEK SOFTCLIX LANCETS MISC Use  as directed twice daily.     ??? acetaminophen (TYLENOL) 500 mg tablet Take 1-2 Tabs by mouth every 6 hours as needed for Pain.   ??? albuterol (VENTOLIN HFA, PROAIR HFA) 90 mcg/actuation inhaler Inhale 2 Puffs by mouth every 6 hours as needed.     ??? albuterol 0.083% (PROVENTIL; VENTOLIN) 2.5 mg /3 mL (0.083 %) nebulizer solution Inhale 3 mL solution by nebulizer as directed every 4 hours as needed for Wheezing or Shortness of Breath.   ??? aspirin EC 81 mg tablet Take 81 mg by mouth at bedtime daily. Take with food.   ??? atorvastatin (LIPITOR) 80 mg tablet Take 80 mg by mouth at bedtime daily.   ??? blood sugar diagnostic (ACCU-CHEK AVIVA PLUS) test strip 1 Strip by Test route before meals and at bedtime.     ??? carvedilol (COREG) 12.5 mg tablet Take 1 Tab by mouth twice daily. Take with food. (Patient taking differently: Take 6.25 mg by mouth twice daily. Take with food.)   ??? cholecalciferol (VITAMIN D-3) 1,000 units tablet Take 1,000 Units by mouth twice daily.   ??? insulin aspart (NOVOLOG) 100 unit/mL flexPEN Inject 10 Units into area(s) as directed three times daily with meals. (Patient taking differently: Inject 21-28 Units under the skin twice daily. Injecting 21 units in the morning and 28 units in the evening. Does not  eat lunch, does not inject at noon)   ??? insulin glargine (LANTUS SOLOSTAR) 100 unit/mL (3 mL) injection PEN Inject 68 Units into area(s) as directed at bedtime daily. (Patient taking differently: Inject 81 Units under the skin at bedtime daily.) ??? levothyroxine (SYNTHROID) 25 mcg tablet Take 25 mcg by mouth daily. Before breakfast    ??? lisinopril (PRINIVIL, ZESTRIL) 2.5 mg tablet Take 1 tablet by mouth daily.   ??? metFORMIN (GLUCOPHAGE) 1,000 mg tablet Take 1,000 mg by mouth twice daily with meals.     ??? nitroglycerin (NITROSTAT) 0.4 mg tablet Place 0.4 mg under tongue every 5 minutes as needed.     ??? omeprazole DR(+) (PRILOSEC) 20 mg capsule Take 20 mg by mouth daily.     ??? ondansetron (ZOFRAN) 8 mg tablet Take 1 tablet by mouth every 8 hours as needed for Nausea or Vomiting. May cause constipation   ??? sertraline (ZOLOFT) 50 mg tablet Take 1 tablet by mouth daily.     Vitals:    01/24/17 1124 01/24/17 1125   BP: 113/57    Pulse: 67    Resp: 14    Temp: 36.6 ???C (97.9 ???F)    TempSrc: Oral Oral   SpO2: 95%    Weight: 109 kg (240 lb 3.2 oz)    Height: 169.4 cm (66.69)      Body mass index is 37.97 kg/m???.   Pain Score: Zero   Pain Addressed:  Patient declines intervention    Patient Evaluated for a Clinical Trial: No treatment clinical trial available for this patient.     [place] Cooperative Oncology Group performance status is 1, Restricted in physically strenuous activity but ambulatory and able to carry out work of a light or sedentary nature, e.g., light house work, office work.     Physical Exam  Constitutional: Well-developed, well-nourished gentleman sitting comfortably in exam room. Appears comfortable.   Eyes: EOMI, no conjunctival injection, anicteric sclerae  ENT: Nares patent, lips and oral mucosae moist, no exudates or ulcerations. Good dentition. No stridor.  CV: Normal rate, regular rhythm, no murmur, rub, or gallop. Trace bilateral bilateral pitting edema back to trace bilaterally.   Respiratory: Normal work of breathing on room air. Good air movement throughout chest, no wheeze, rales, or rhonchi.  GI: Abdomen obese, soft, and nondistended. Bowel sounds present in all 4 quadrants. No tenderness to palpation. No hepatomegaly, spleen tip not palpated.  GU: No catheter present  Msk: Normal muscle bulk and tone. No joint deformities or arthritic abnormalities. Trace pitting edema BLE (stable)  Neuro: Cranial nerves grossly intact and symmetric. No focal neurologic deficits. Normal gait.  Integument/skin: No rash visualized nor any skin eruptions palpated.  Heme/lymph: No pathologic-appearing bruising. No cervical or supraclavicular lymphdenopathy.  Psych: Alert and oriented to person, place, date, and situation. Affect is full and content. Good insight and judgement.   Access: Port-a-cath site well healed        Data:  I have reviewed the patient's CBC/differential and compared to prior values. The full CBC is as follows:  CBC with Diff Latest Ref Rng & Units 11/01/2016 09/13/2016 08/09/2016 06/28/2016 06/07/2016   WBC 4.5 - 11.0 K/UL 5.5 5.9 4.7 7.1 6.1   RBC 4.4 - 5.5 M/UL 4.35(L) 4.32(L) 3.91(L) 3.72(L) 3.62(L)   HGB 13.5 - 16.5 GM/DL 11.8(L) 11.2(L) 10.5(L) 9.7(L) 9.8(L)   HCT 40 - 50 % 36.3(L) 35.2(L) 32.3(L) 31.2(L) 30.1(L)   MCV 80 - 100 FL 83.3 81.4 82.5 83.7 83.2  MCH 26 - 34 PG 27.0 26.0 26.7 26.2 27.2   MCHC 32.0 - 36.0 G/DL 16.0 31.9(L) 32.4 31.3(L) 32.7   RDW 11 - 15 % 18.2(H) 18.1(H) 18.0(H) 20.8(H) 22.4(H)   PLT 150 - 400 K/UL 182 171 165 237 195   MPV 7 - 11 FL 8.0 8.0 7.9 7.6 7.7   NEUT 41 - 77 % 68 67 - 73 92(H)   ANC 1.8 - 7.0 K/UL 3.80 3.90 - 5.20 5.60   LYMA 24 - 44 % 14(L) 16(L) - 15(L) 7(L)   ALYM 1.0 - 4.8 K/UL 0.80(L) 1.00 - 1.10 0.40(L)   MONA 4 - 12 % 8 8 - 10 1(L)   AMONO 0 - 0.80 K/UL 0.40 0.50 - 0.70 0.10   EOSA 0 - 5 % 9(H) 8(H) - 1 0   AEOS 0 - 0.45 K/UL 0.50(H) 0.40 - 0.00 0.00   BASA 0 - 2 % 1 1 - 1 0   ABAS 0 - 0.20 K/UL 0.10 0.00 - 0.10 0.00       I have reviewed the patient's CMP and compared to prior values. The full CMP is as follows:  CMP Latest Ref Rng & Units 11/01/2016 09/13/2016 08/09/2016 06/28/2016 06/07/2016   NA 137 - 147 MMOL/L 136(L) 135(L) 139 138 139   K 3.5 - 5.1 MMOL/L 4.7 4.2 3.7 4.2 4.5 CL 98 - 110 MMOL/L 99 99 102 100 102   CO2 21 - 30 MMOL/L 27 29 29  32(H) 25   GAP 3 - 12 10 7 8 6 12    BUN 7 - 25 MG/DL 10(X) 21 21 23 20    CR 0.4 - 1.24 MG/DL 3.23 5.57 3.22 0.25 4.27   GLUX 70 - 100 MG/DL 062(B) 762(G) 315(V) 761(Y) 280(H)   CA 8.5 - 10.6 MG/DL 9.6 9.5 9.0 9.3 8.9   TP 6.0 - 8.0 G/DL 6.8 6.7 6.5 6.2 6.1   ALB 3.5 - 5.0 G/DL 4.0 4.0 3.6 3.5 3.5   ALKP 25 - 110 U/L 104 100 103 84 94   ALT 7 - 56 U/L 21 15 14 9 14    TBILI 0.3 - 1.2 MG/DL 0.5 0.5 0.4 0.5 0.5   GFR >60 mL/min >60 >60 >60 >60 >60   GFRAA >60 mL/min >60 >60 >60 >60 >60     Lab Results   Component Value Date    PSA 0.16 01/24/2017    PSA 0.06 11/01/2016    PSA 0.06 09/13/2016    PSA 0.05 08/09/2016    PSA  06/28/2016     <0.10  Reference range: <=4.5  Unit: ng/mL  [facility], [address], [place] [address]      PSA 0.32 04/26/2016    TESTOSTER 21 (L) 01/24/2017    TESTOSTER 20 (L) 09/13/2016    TESTOSTER 23 (L) 08/09/2016    TESTOSTER <10 (L) 06/28/2016          Assessment and Plan: Alexander Holland is a 66 y.o. gentleman with history of coronary artery disease who presents regarding evaluation and management of metastatic prostate cancer.  1. Prostate cancer:    ???Alexander Holland brought some paperwork from a local area [place], in which his PSA lab value was reported at 0.13. Discussed this in clinic, and informed him that it is higher than his recent values but does not spark significant concern without any clinical changes.   ???Proceed with Lupron 22.5 mg IM today, labs reviewed and adequate to proceed with  treatment  ???Continue follow up with labs in conjunction with Lupron, q3 months. If next PSA value has risen will shorten interval of follow up and consider repeat imaging/     2. Decrement in LVEF:  ???Following closely with cardiology, appreciate their management.    3. Fatigue:  ???Significantly improved, now grade 1. Continue to monitor.    4. Goals of care: ???Stated goal is longevity.  He stated that he is willing to accept the risk of medical burden (new symptoms, hospitalizations) in pursuit of this goal.        Total face-to-face time for visit 16 minutes, all of which was spent education and counseling with patient in clinic. Discussed the patient's cancer diagnosis and management.

## 2017-01-29 NOTE — Telephone Encounter
Notified pt wife that his PSA remains barely detectable. She verbalized understanding and stated she would let him know!

## 2017-02-17 ENCOUNTER — Encounter: Admit: 2017-02-17 | Discharge: 2017-02-17 | Payer: MEDICARE

## 2017-02-17 DIAGNOSIS — C61 Malignant neoplasm of prostate: Principal | ICD-10-CM

## 2017-02-17 NOTE — Progress Notes
Pt called to report left hip pain 6 on the scale, he describes as a dull ache, constant, and hurts when he lays down at night and  has a difficult time sleeping. He had to take 2 hydrocodone he had from a previous time that took the edge off and he finally was able to sleep at 2 am. Notified Dr Dondra Spry, she requested a bone scan and an MRI of his hip joint. Scheduled the scans for 11/6 and RV to discuss 11/9. Notified the pt, he verbalized understanding.

## 2017-02-18 ENCOUNTER — Encounter: Admit: 2017-02-18 | Discharge: 2017-02-18 | Payer: MEDICARE

## 2017-02-18 ENCOUNTER — Encounter: Admit: 2017-02-18 | Discharge: 2017-02-19 | Payer: MEDICARE

## 2017-02-18 DIAGNOSIS — C61 Malignant neoplasm of prostate: Principal | ICD-10-CM

## 2017-02-18 DIAGNOSIS — C7951 Secondary malignant neoplasm of bone: ICD-10-CM

## 2017-02-18 LAB — POC CREATININE, RAD: Lab: 1.1 mg/dL (ref 0.4–1.24)

## 2017-02-18 MED ORDER — GADOBENATE DIMEGLUMINE 529 MG/ML (0.1MMOL/0.2ML) IV SOLN
20 mL | Freq: Once | INTRAVENOUS | 0 refills | Status: CP
Start: 2017-02-18 — End: ?
  Administered 2017-02-18: 15:00:00 20 mL via INTRAVENOUS

## 2017-02-18 MED ORDER — RP DX TC-99M MEDRONATE MCI
25 | Freq: Once | INTRAVENOUS | 0 refills | Status: CP
Start: 2017-02-18 — End: ?
  Administered 2017-02-18: 14:00:00 25.7 via INTRAVENOUS

## 2017-02-18 MED ORDER — SODIUM CHLORIDE 0.9 % IJ SOLN
10 mL | Freq: Once | INTRAVENOUS | 0 refills | Status: CP
Start: 2017-02-18 — End: ?
  Administered 2017-02-18: 15:00:00 10 mL via INTRAVENOUS

## 2017-02-21 ENCOUNTER — Encounter: Admit: 2017-02-21 | Discharge: 2017-02-21 | Payer: MEDICARE

## 2017-02-21 DIAGNOSIS — I130 Hypertensive heart and chronic kidney disease with heart failure and stage 1 through stage 4 chronic kidney disease, or unspecified chronic kidney disease: ICD-10-CM

## 2017-02-21 DIAGNOSIS — I509 Heart failure, unspecified: ICD-10-CM

## 2017-02-21 DIAGNOSIS — J449 Chronic obstructive pulmonary disease, unspecified: ICD-10-CM

## 2017-02-21 DIAGNOSIS — I214 Non-ST elevation (NSTEMI) myocardial infarction: ICD-10-CM

## 2017-02-21 DIAGNOSIS — R0600 Dyspnea, unspecified: ICD-10-CM

## 2017-02-21 DIAGNOSIS — B0229 Other postherpetic nervous system involvement: ICD-10-CM

## 2017-02-21 DIAGNOSIS — E669 Obesity, unspecified: ICD-10-CM

## 2017-02-21 DIAGNOSIS — M76892 Other specified enthesopathies of left lower limb, excluding foot: ICD-10-CM

## 2017-02-21 DIAGNOSIS — Z87891 Personal history of nicotine dependence: ICD-10-CM

## 2017-02-21 DIAGNOSIS — E039 Hypothyroidism, unspecified: ICD-10-CM

## 2017-02-21 DIAGNOSIS — I2510 Atherosclerotic heart disease of native coronary artery without angina pectoris: Principal | ICD-10-CM

## 2017-02-21 DIAGNOSIS — J45909 Unspecified asthma, uncomplicated: ICD-10-CM

## 2017-02-21 DIAGNOSIS — M1990 Unspecified osteoarthritis, unspecified site: ICD-10-CM

## 2017-02-21 DIAGNOSIS — D649 Anemia, unspecified: ICD-10-CM

## 2017-02-21 DIAGNOSIS — C7951 Secondary malignant neoplasm of bone: ICD-10-CM

## 2017-02-21 DIAGNOSIS — K219 Gastro-esophageal reflux disease without esophagitis: ICD-10-CM

## 2017-02-21 DIAGNOSIS — E119 Type 2 diabetes mellitus without complications: ICD-10-CM

## 2017-02-21 DIAGNOSIS — M25552 Pain in left hip: ICD-10-CM

## 2017-02-21 DIAGNOSIS — A4902 Methicillin resistant Staphylococcus aureus infection, unspecified site: ICD-10-CM

## 2017-02-21 DIAGNOSIS — I10 Essential (primary) hypertension: ICD-10-CM

## 2017-02-21 DIAGNOSIS — E785 Hyperlipidemia, unspecified: ICD-10-CM

## 2017-02-21 DIAGNOSIS — G4733 Obstructive sleep apnea (adult) (pediatric): ICD-10-CM

## 2017-02-21 DIAGNOSIS — C61 Malignant neoplasm of prostate: Principal | ICD-10-CM

## 2017-02-21 DIAGNOSIS — N189 Chronic kidney disease, unspecified: ICD-10-CM

## 2017-02-21 MED ORDER — METHYLPREDNISOLONE 4 MG PO DSPK
ORAL_TABLET | 0 refills | Status: AC
Start: 2017-02-21 — End: 2017-05-15

## 2017-02-21 NOTE — Progress Notes
Name: Alexander Holland          MRN: [id number]      DOB: 1950/10/01      AGE: 66 y.o.   DATE OF SERVICE: 02/21/2017    Subjective:           Reason for Visit: Metastatic prostate cancer  Heme/Onc Care    Cancer Staging  Prostate cancer [facility])  Staging form: Prostate, AJCC 7th Edition  - Clinical stage from 01/31/2016: Stage IV (T3b, N1, M1b, PSA: Less than 10, Gleason 8-10) - Signed by Ross Marcus, MD on 01/31/2016       Prostate cancer (HCC)    01/05/2016 Surgery     Transurethral resection of prostate: Pathology revealed Gleason 4+5=9 adenocarcinoma of the prostate         01/17/2016 Initial Diagnosis     Prostate cancer (HCC)       01/17/2016 Imaging     MRI pelvis: Extension of prostate tumor into mesorectal fascia and seminal vesicles, pelvic lymphadenopathy, +abnormal osseous lesions concerning for metastatic disease           01/26/2016 Imaging     Bone scan: Widespread osseous metastases in axial and appendicular skeleton         01/31/2016 -  Chemotherapy     Bicalutamide 50 mg PO daily         02/19/2016 - 06/07/2016 Chemotherapy     Docetaxel 75 mg/m2 IV q3 weeks, plan for 6 cycles.  Administered without curative intent.         02/19/2016 -  Chemotherapy     Lupron 22.5 mg IM q3 months           History of Present Illness  Alexander Holland returns for an urgent visit due to new, significant pain in the L hip/pelvis/LE.     He describes this as a L inguinal pain that began approximately 3 weeks ago. He characterizes is as a strong ache with radiation down the anterior aspect of his thigh. It is minimal at rest and exacerbated by hip flexion, internal rotation, and also by laying supine. He denies any antecedent trauma or repetitive motions, and denies any point tenderness. He does note that this seems to have started around the time of his most recent Lupron injection and has worsened since.    Denies any falls or instability but notes that many daily motions and activities are made more difficult by this. Review of Systems   Constitutional: Positive for fatigue (minimal).   HENT: Negative.    Eyes: Negative.    Respiratory: Positive for shortness of breath (on exertion, mild).    Cardiovascular: Negative.  Negative for leg swelling.   Gastrointestinal: Negative.    Endocrine: Negative.    Musculoskeletal: Negative.    Skin: Negative.    Allergic/Immunologic: Negative.    Neurological: Negative.    Hematological: Negative.    Psychiatric/Behavioral: Negative.      Objective:      Past medical, surgical, family, and social history have been reviewed with the patient on 02/21/2017, and confirmed accuracy of the information outlined below:  Past Medical History:   Diagnosis Date   ??? Anemia    ??? Arthritis     DJD-Hip   ??? Asthma    ??? CAD (coronary artery disease)    ??? CHF (congestive heart failure) (HCC) 08/2015   ??? Chronic renal insufficiency    ??? COPD (chronic obstructive pulmonary disease) (HCC)    ??? DM (diabetes  mellitus) (HCC)     120's,130's   ??? DOE (dyspnea on exertion)    ??? Ex-smoker    ??? GERD (gastroesophageal reflux disease)    ??? HLD (hyperlipidemia)    ??? HTN (hypertension)    ??? Hypothyroidism    ??? MRSA (methicillin resistant Staphylococcus aureus)    ??? NSTEMI (non-ST elevated myocardial infarction) (HCC)     1997, 2012   ??? Obesity    ??? OSA on CPAP    ??? Postherpetic neuralgia      Past Surgical History:   Procedure Laterality Date   ??? CORONARY STENT PLACEMENT  1997   ??? CARDIAC SURGERY  2012    CABG   ??? TURP N/A 01/05/2016    CYSTOSCOPY WITH TRANSURETHRAL PROSTATECTOMY performed by Mittie Bodo, MD at [facility]   ??? ABDOMEN SURGERY      Umbilical Hernia   ??? HX HEART CATHETERIZATION     ??? LEFT HEART CATHETERIZATION      s/p PCI ~1995     No family history on file.  Social History     Social History   ??? Marital status: Married     Spouse name: N/A   ??? Number of children: N/A   ??? Years of education: N/A     Occupational History   ??? Not on file.     Social History Main Topics ??? Smoking status: Former Smoker     Packs/day: 1.00     Years: 30.00     Types: Cigarettes     Quit date: 02/01/1996   ??? Smokeless tobacco: Never Used   ??? Alcohol use Yes      Comment: rarely   ??? Drug use: No   ??? Sexual activity: Not on file     Other Topics Concern   ??? Not on file     Social History Narrative   ??? No narrative on file   Alexander Holland is married and lives with his wife. 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 They have 3 children (2 daughters, 1 son).        ??? ACCU-CHEK SOFTCLIX LANCETS MISC Use  as directed twice daily.     ??? acetaminophen (TYLENOL) 500 mg tablet Take 1-2 Tabs by mouth every 6 hours as needed for Pain.   ??? albuterol (VENTOLIN HFA, PROAIR HFA) 90 mcg/actuation inhaler Inhale 2 Puffs by mouth every 6 hours as needed.     ??? albuterol 0.083% (PROVENTIL; VENTOLIN) 2.5 mg /3 mL (0.083 %) nebulizer solution Inhale 3 mL solution by nebulizer as directed every 4 hours as needed for Wheezing or Shortness of Breath.   ??? aspirin EC 81 mg tablet Take 81 mg by mouth at bedtime daily. Take with food.   ??? atorvastatin (LIPITOR) 80 mg tablet Take 80 mg by mouth at bedtime daily.   ??? blood sugar diagnostic (ACCU-CHEK AVIVA PLUS) test strip 1 Strip by Test route before meals and at bedtime.     ??? carvedilol (COREG) 12.5 mg tablet Take 1 Tab by mouth twice daily. Take with food. (Patient taking differently: Take 6.25 mg by mouth twice daily. Take with food.)   ??? cholecalciferol (VITAMIN D-3) 1,000 units tablet Take 1,000 Units by mouth twice daily.   ??? insulin aspart (NOVOLOG) 100 unit/mL flexPEN Inject 10 Units into area(s) as directed three times daily with meals. (Patient taking differently: Inject 21-28 Units under the skin twice daily. Injecting 21 units  in the morning and 28 units in the evening. Does not eat lunch, does not inject at noon)   ??? insulin glargine (LANTUS SOLOSTAR) 100 unit/mL (3 mL) injection PEN Inject 68 Units into area(s) as directed at bedtime daily. (Patient taking differently: Inject 81 Units under the skin at bedtime daily.)   ??? levothyroxine (SYNTHROID) 25 mcg tablet Take 25 mcg by mouth daily. Before breakfast    ??? lisinopril (PRINIVIL, ZESTRIL) 2.5 mg tablet Take 1 tablet by mouth daily.   ??? metFORMIN (GLUCOPHAGE) 1,000 mg tablet Take 1,000 mg by mouth twice daily with meals.     ??? nitroglycerin (NITROSTAT) 0.4 mg tablet Place 0.4 mg under tongue every 5 minutes as needed.     ??? omeprazole DR(+) (PRILOSEC) 20 mg capsule Take 20 mg by mouth daily.     ??? ondansetron (ZOFRAN) 8 mg tablet Take 1 tablet by mouth every 8 hours as needed for Nausea or Vomiting. May cause constipation   ??? sertraline (ZOLOFT) 50 mg tablet Take 1 tablet by mouth daily.     Vitals:    02/21/17 0811   BP: 119/62   Pulse: 76   Resp: 16   Temp: 36.6 ???C (97.9 ???F)   TempSrc: Oral   SpO2: 93%   Weight: 110.9 kg (244 lb 9.6 oz)   Height: 169.4 cm (66.69)     Body mass index is 38.66 kg/m???.   Pain Score: Zero   Pain Addressed:  Patient declines intervention    Patient Evaluated for a Clinical Trial: No treatment clinical trial available for this patient.     [place] Cooperative Oncology Group performance status is 1, Restricted in physically strenuous activity but ambulatory and able to carry out work of a light or sedentary nature, e.g., light house work, office work.     Physical Exam  Constitutional: Well-developed, well-nourished gentleman sitting comfortably in exam room. Appears comfortable.   Eyes: EOMI, no conjunctival injection, anicteric sclerae  ENT: Nares patent, lips and oral mucosae moist, no exudates or ulcerations. Good dentition. No stridor.  CV: Normal rate, regular rhythm, no murmur, rub, or gallop. Trace bilateral bilateral pitting edema in feet, ankles, calves.  Respiratory: Normal work of breathing on room air. Good air movement throughout chest, no wheeze, rales, or rhonchi. GI: Abdomen obese, soft, and nondistended. Bowel sounds present in all 4 quadrants. No tenderness to palpation. No hepatomegaly, spleen tip not palpated.  GU: No catheter present  Msk: Normal muscle bulk and tone. Cautious gait, 5/5 strength in bilateral hands, bicepts, triceps, deltoids, R hip flexor, bilateral hamstrings, ankle dorsi and plantar flexors. 4+/5 hip flexors, quads. Seemed to favor L hip flexors and quads due to pain.  Neuro: Cranial nerves grossly intact and symmetric. No focal neurologic deficits. Normal gait.  Integument/skin: No rash visualized nor any skin eruptions palpated.  Heme/lymph: No pathologic-appearing bruising. No cervical or supraclavicular lymphdenopathy.  Psych: Alert and oriented to person, place, date, and situation. Affect is full and content. Good insight and judgement.           Data:  I have reviewed the patient's CBC/differential and compared to prior values. The full CBC is as follows:  CBC with Diff Latest Ref Rng & Units 11/01/2016 09/13/2016 08/09/2016 06/28/2016 06/07/2016   WBC 4.5 - 11.0 K/UL 5.5 5.9 4.7 7.1 6.1   RBC 4.4 - 5.5 M/UL 4.35(L) 4.32(L) 3.91(L) 3.72(L) 3.62(L)   HGB 13.5 - 16.5 GM/DL 11.8(L) 11.2(L) 10.5(L) 9.7(L) 9.8(L)   HCT 40 -  50 % 36.3(L) 35.2(L) 32.3(L) 31.2(L) 30.1(L)   MCV 80 - 100 FL 83.3 81.4 82.5 83.7 83.2   MCH 26 - 34 PG 27.0 26.0 26.7 26.2 27.2   MCHC 32.0 - 36.0 G/DL 16.1 31.9(L) 32.4 31.3(L) 32.7   RDW 11 - 15 % 18.2(H) 18.1(H) 18.0(H) 20.8(H) 22.4(H)   PLT 150 - 400 K/UL 182 171 165 237 195   MPV 7 - 11 FL 8.0 8.0 7.9 7.6 7.7   NEUT 41 - 77 % 68 67 - 73 92(H)   ANC 1.8 - 7.0 K/UL 3.80 3.90 - 5.20 5.60   LYMA 24 - 44 % 14(L) 16(L) - 15(L) 7(L)   ALYM 1.0 - 4.8 K/UL 0.80(L) 1.00 - 1.10 0.40(L)   MONA 4 - 12 % 8 8 - 10 1(L)   AMONO 0 - 0.80 K/UL 0.40 0.50 - 0.70 0.10   EOSA 0 - 5 % 9(H) 8(H) - 1 0   AEOS 0 - 0.45 K/UL 0.50(H) 0.40 - 0.00 0.00   BASA 0 - 2 % 1 1 - 1 0   ABAS 0 - 0.20 K/UL 0.10 0.00 - 0.10 0.00 I have reviewed the patient's CMP and compared to prior values. The full CMP is as follows:  CMP Latest Ref Rng & Units 11/01/2016 09/13/2016 08/09/2016 06/28/2016 06/07/2016   NA 137 - 147 MMOL/L 136(L) 135(L) 139 138 139   K 3.5 - 5.1 MMOL/L 4.7 4.2 3.7 4.2 4.5   CL 98 - 110 MMOL/L 99 99 102 100 102   CO2 21 - 30 MMOL/L 27 29 29  32(H) 25   GAP 3 - 12 10 7 8 6 12    BUN 7 - 25 MG/DL 09(U) 21 21 23 20    CR 0.4 - 1.24 MG/DL 0.45 4.09 8.11 9.14 7.82   GLUX 70 - 100 MG/DL 956(O) 130(Q) 657(Q) 469(G) 280(H)   CA 8.5 - 10.6 MG/DL 9.6 9.5 9.0 9.3 8.9   TP 6.0 - 8.0 G/DL 6.8 6.7 6.5 6.2 6.1   ALB 3.5 - 5.0 G/DL 4.0 4.0 3.6 3.5 3.5   ALKP 25 - 110 U/L 104 100 103 84 94   ALT 7 - 56 U/L 21 15 14 9 14    TBILI 0.3 - 1.2 MG/DL 0.5 0.5 0.4 0.5 0.5   GFR >60 mL/min >60 >60 >60 >60 >60   GFRAA >60 mL/min >60 >60 >60 >60 >60     Lab Results   Component Value Date    PSA 0.16 01/24/2017    PSA 0.06 11/01/2016    PSA 0.06 09/13/2016    PSA 0.05 08/09/2016    PSA  06/28/2016     <0.10  Reference range: <=4.5  Unit: ng/mL  [facility], [address], [place] [address]      PSA 0.32 04/26/2016    TESTOSTER 21 (L) 01/24/2017    TESTOSTER 20 (L) 09/13/2016    TESTOSTER 23 (L) 08/09/2016    TESTOSTER <10 (L) 06/28/2016      I personally reviewed images from the patient's nuclear medicine bone scan dated 02/18/17, and my impression is that the patient has no new areas of pathologically-increased uptake, therefore I conclude that he has stable osseous metastatic disease. Formal interpretation is as follows:  FINDINGS:   Physiologic activity is present within the kidneys and bladder.   Significant interval improvement in radiotracer uptake within the scattered osseous metastases throughout the axial and proximal appendicular skeleton. No definite new focal radiotracer uptake  is seen.    IMPRESSION  Significant interval decrease in radiotracer uptake within the scattered osseous metastases, reflective of response to therapy. Approved by Marcelline Mates, M.D. on 02/18/2017 11:41 AM    By my electronic signature, I attest that I have personally reviewed the images for this examination and formulated the interpretations and opinions expressed in this report    Finalized by Particia Jasper, M.D. on 02/18/2017 11:52 AM. Dictated by Marcelline Mates, M.D. on 02/18/2017 11:38 AM.    Formal interpretation of MRI pelvis and hip  IMPRESSION  1. Overall improvement in osseous metastases within the pelvis and left femur since January 26, 2016 with increased conspicuity of a tiny enhancing metastasis in the subtrochanteric left femur.  2. Increased signal compatible with tendinopathy at the origin of the rectus femoris as well as along the distal insertion of the left gluteus medius and minimus tendons.  3. Interval decrease in size of the external iliac lymph nodes anteriorly with no significant change in size of enlarged lymph nodes posterior to the external iliac vessels.    Approved by Ranee Gosselin, M.D. on 02/18/2017 1:13 PM    By my electronic signature, I attest that I have personally reviewed the images for this examination and formulated the interpretations and opinions expressed in this report    ???Finalized by Ivory Broad, M.D. on 02/18/2017 4:14 PM. Dictated by Ranee Gosselin, M.D. on 02/18/2017 9:53 AM.        Assessment and Plan: Alexander Holland is a 66 y.o. gentleman with history of coronary artery disease who presents regarding evaluation and management of metastatic prostate cancer.    1. Radiating L inguinal pain:  ???Seems most consistent with rectus femoris tendinitis, although the reason that this may have developed is unclear.  ???No evidence of progressive metastatic disease at this time.   ???Will try Medrol dosepak and physical therapy referral.    2. Prostate cancer:  ???Disease appears stable at this time, continue follow up with labs in conjunction with Lupron, q3 months.     3. CHF:  ???Following closely with cardiology, appreciate their management. 4. Goals of care:  ???Stated goal is longevity.  He stated that he is willing to accept the risk of medical burden (new symptoms, hospitalizations) in pursuit of this goal.        Total face-to-face time for visit 18 minutes, all of which was spent education and counseling with patient in clinic. Discussed the patient's cancer diagnosis and management.

## 2017-03-14 ENCOUNTER — Encounter: Admit: 2017-03-14 | Discharge: 2017-03-14 | Payer: MEDICARE

## 2017-03-14 DIAGNOSIS — M76892 Other specified enthesopathies of left lower limb, excluding foot: Principal | ICD-10-CM

## 2017-03-14 NOTE — Telephone Encounter
pt wife called to report he continues to have discomfort as a result of the tendonitis of the left hip flexor. She reports his pain came back when the steroids wore off. He has tried physical therapy without any relief. Notified Dr Dondra Spry, she requested an orthopedic consult. I spoke with Dr Karle Starch [facility] office in [place] and set him up for 04/23/17. Will fax a refferal, demo, and ins info and arrange for a disc of his films be mailed to their office.

## 2017-03-21 LAB — COMPREHENSIVE METABOLIC PANEL
Lab: 0.5
Lab: 1 — ABNORMAL HIGH (ref 80–115)
Lab: 101
Lab: 13
Lab: 13 — ABNORMAL LOW (ref 33.0–37.0)
Lab: 139 — ABNORMAL LOW (ref 14.0–18.0)
Lab: 207 — ABNORMAL HIGH (ref 80–115)
Lab: 28
Lab: 3.2 — ABNORMAL LOW (ref 3.4–4.8)
Lab: 30
Lab: 6.4
Lab: 9.2 — ABNORMAL HIGH (ref 11.5–14.5)

## 2017-03-21 LAB — BNP (B-TYPE NATRIURETIC PEPTI): Lab: 173 — ABNORMAL HIGH (ref 0–100)

## 2017-03-21 LAB — TROPONIN-I

## 2017-04-01 ENCOUNTER — Ambulatory Visit: Admit: 2017-04-01 | Discharge: 2017-04-02 | Payer: MEDICARE

## 2017-04-01 ENCOUNTER — Encounter: Admit: 2017-04-01 | Discharge: 2017-04-01 | Payer: MEDICARE

## 2017-04-01 ENCOUNTER — Encounter: Admit: 2017-04-01 | Discharge: 2017-04-02 | Payer: MEDICARE

## 2017-04-01 DIAGNOSIS — E669 Obesity, unspecified: ICD-10-CM

## 2017-04-01 DIAGNOSIS — I10 Essential (primary) hypertension: Principal | ICD-10-CM

## 2017-04-01 DIAGNOSIS — Z87891 Personal history of nicotine dependence: ICD-10-CM

## 2017-04-01 DIAGNOSIS — J449 Chronic obstructive pulmonary disease, unspecified: ICD-10-CM

## 2017-04-01 DIAGNOSIS — G4733 Obstructive sleep apnea (adult) (pediatric): ICD-10-CM

## 2017-04-01 DIAGNOSIS — B0229 Other postherpetic nervous system involvement: ICD-10-CM

## 2017-04-01 DIAGNOSIS — E119 Type 2 diabetes mellitus without complications: ICD-10-CM

## 2017-04-01 DIAGNOSIS — I2510 Atherosclerotic heart disease of native coronary artery without angina pectoris: Principal | ICD-10-CM

## 2017-04-01 DIAGNOSIS — E785 Hyperlipidemia, unspecified: ICD-10-CM

## 2017-04-01 DIAGNOSIS — D649 Anemia, unspecified: ICD-10-CM

## 2017-04-01 DIAGNOSIS — E039 Hypothyroidism, unspecified: ICD-10-CM

## 2017-04-01 DIAGNOSIS — A4902 Methicillin resistant Staphylococcus aureus infection, unspecified site: ICD-10-CM

## 2017-04-01 DIAGNOSIS — R600 Localized edema: ICD-10-CM

## 2017-04-01 DIAGNOSIS — M1990 Unspecified osteoarthritis, unspecified site: ICD-10-CM

## 2017-04-01 DIAGNOSIS — K219 Gastro-esophageal reflux disease without esophagitis: ICD-10-CM

## 2017-04-01 DIAGNOSIS — R69 Illness, unspecified: Principal | ICD-10-CM

## 2017-04-01 DIAGNOSIS — I509 Heart failure, unspecified: ICD-10-CM

## 2017-04-01 DIAGNOSIS — I214 Non-ST elevation (NSTEMI) myocardial infarction: ICD-10-CM

## 2017-04-01 DIAGNOSIS — N189 Chronic kidney disease, unspecified: ICD-10-CM

## 2017-04-01 DIAGNOSIS — J45909 Unspecified asthma, uncomplicated: ICD-10-CM

## 2017-04-01 DIAGNOSIS — R0600 Dyspnea, unspecified: ICD-10-CM

## 2017-04-01 NOTE — Progress Notes
Date of Service: 04/01/2017    Alexander Holland is a 66 y.o. male.       HPI     I saw Alexander Holland today.  He was in the ER a week ago with unilaterally left lower extremity swelling that they thought was heart failure they did not do a venous duplex him and arrange that today.    Is also seeing oncology for chemotherapy after metastatic prostate cancer diagnosis think he may have some chemotherapy related cardiotoxicity in the past LV function.    He is not complaining of orthopnea but he is limited by his activity and shortness of breath.  He will be seeing orthopedic surgery the first week in January regarding his left hip which apparently had tendinitis on MRI but no fracture.  This is the same leg that has a unilateral swelling he has no fevers chills or sweats.  Examination reveals an obese 66 year old lady gentleman with         Vitals:    04/01/17 1001   BP: 124/72   Pulse: 74   Weight: 109.2 kg (240 lb 12.8 oz)   Height: 1.702 m (5' 7)     Body mass index is 37.71 kg/m???.     Past Medical History  Patient Active Problem List    Diagnosis Date Noted   ??? HFrEF (heart failure with reduced ejection fraction) (HCC) 06/20/2016   ??? Edema 06/20/2016   ??? Prostate cancer (HCC) 01/17/2016     -  Gleason 4+5=9 prostate cancer on TURP from 01/05/2016.  MRI with bulky pelvic metastatic disease    ABDOMEN:  1. Normal sized upper abdominal lymph nodes, which are nonspecific and could represent reactive lymph nodes or nodal metastatic disease.  2. Extensive osseous lesions throughout the thoracolumbar spine, compatible with osseous metastatic disease. Correlate with ordered bone scan to evaluate extent of disease.    PELVIS:  1. Complete replacement of the prostate gland by tumor with extraprostatic extension to the mesorectal fascia and the seminal vesicles. Nodular tumor extends to abut the bladder base.  2. Extensive of pelvic lymphadenopathy compatible with nodal metastatic disease. 3. Extensive osseous metastatic disease. ???      Bone scan with diffuse metastatic disease:    Widespread osseous metastatic disease.     ??? BPH (benign prostatic hyperplasia) 01/05/2016   ??? BPH with obstruction/lower urinary tract symptoms 10/18/2015     10/18/15 for benign prostatic hypertrophy.  Currently on Flomax and finasteride with an episode of urinary retention in May.  He has been on catheter dependent since that time.  Currently being evaluated with transrectal ultrasound at the [facility] on July 12.  Large prostate approximately 45 g.  Last PSA was approximately 2    TRUS volume at the [place] which was 37g.     UDS (12/14/15): small capacity, retention; poor compliance and BOO.    01/05/16 bipolar TURP     ??? Respiratory failure (HCC) 02/15/2011   ??? Sepsis(995.91) 02/15/2011   ??? Pneumonia 02/15/2011   ??? HTN (hypertension) 02/02/2011   ??? HLD (hyperlipidemia) 02/02/2011   ??? CAD (coronary artery disease) 02/02/2011   ??? NSTEMI (non-ST elevated myocardial infarction) (HCC) 02/02/2011   ??? DM type 2 (diabetes mellitus, type 2) (HCC) 02/02/2011   ??? Hypothyroidism 02/02/2011         Review of Systems   Constitution: Positive for weakness and malaise/fatigue.   HENT: Negative.    Eyes: Negative.    Cardiovascular: Positive for dyspnea  on exertion and leg swelling.   Respiratory: Negative.    Endocrine: Negative.    Hematologic/Lymphatic: Negative.    Skin: Negative.    Musculoskeletal: Positive for joint pain.   Gastrointestinal: Negative.    Genitourinary: Negative.    Psychiatric/Behavioral: Negative.    Allergic/Immunologic: Negative.        Physical Exam  Keloid median sternotomy lung sounds are all in the bases no rales heart tones are regular without an S3 abdomen is obese but benign his left lower extremity has 2+ edema to the knee No negative Homans sign the right lower extremity has no edema.  Affect is normal gait is limited by his obesity short windedness.    Cardiovascular Studies Twelve-lead EKG today shows normal sinus rhythm.    Problems Addressed Today  No diagnosis found.    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  I have not I am going to check his venous duplex today just to ensure that this is not deep vein thrombosis occultly in light of his history of metastatic cancer and left hip pain.  If that is negative we will push his diuretics further.          Current Medications (including today's revisions)  ??? ACCU-CHEK SOFTCLIX LANCETS MISC Use  as directed twice daily.     ??? acetaminophen (TYLENOL) 500 mg tablet Take 1-2 Tabs by mouth every 6 hours as needed for Pain.   ??? albuterol (VENTOLIN HFA, PROAIR HFA) 90 mcg/actuation inhaler Inhale 2 Puffs by mouth every 6 hours as needed.     ??? albuterol 0.083% (PROVENTIL; VENTOLIN) 2.5 mg /3 mL (0.083 %) nebulizer solution Inhale 3 mL solution by nebulizer as directed every 4 hours as needed for Wheezing or Shortness of Breath.   ??? aspirin EC 81 mg tablet Take 81 mg by mouth at bedtime daily. Take with food.   ??? atorvastatin (LIPITOR) 80 mg tablet Take 80 mg by mouth at bedtime daily.   ??? blood sugar diagnostic (ACCU-CHEK AVIVA PLUS) test strip 1 Strip by Test route before meals and at bedtime.     ??? carvedilol (COREG) 12.5 mg tablet Take 1 Tab by mouth twice daily. Take with food. (Patient taking differently: Take 6.25 mg by mouth twice daily. Take with food.)   ??? cholecalciferol (VITAMIN D-3) 1,000 units tablet Take 1,000 Units by mouth twice daily.   ??? insulin aspart (NOVOLOG) 100 unit/mL flexPEN Inject 10 Units into area(s) as directed three times daily with meals. (Patient taking differently: Inject 21-28 Units under the skin twice daily. Injecting 21 units in the morning and 28 units in the evening. Does not eat lunch, does not inject at noon)   ??? insulin glargine (LANTUS SOLOSTAR) 100 unit/mL (3 mL) injection PEN Inject 68 Units into area(s) as directed at bedtime daily. (Patient taking differently: Inject 81 Units under the skin at bedtime daily.)   ??? levothyroxine (SYNTHROID) 25 mcg tablet Take 25 mcg by mouth daily. Before breakfast    ??? lisinopril (PRINIVIL, ZESTRIL) 2.5 mg tablet Take 1 tablet by mouth daily.   ??? metFORMIN (GLUCOPHAGE) 1,000 mg tablet Take 1,000 mg by mouth twice daily with meals.     ??? methylPREDNIsolone (MEDROL DOSEPAK)  4 mg tablet Take medication as directed on package for 6 days. Take with food.   ??? nitroglycerin (NITROSTAT) 0.4 mg tablet Place 0.4 mg under tongue every 5 minutes as needed.     ??? omeprazole DR(+) (PRILOSEC) 20 mg capsule Take 20 mg by mouth daily.     ??? ondansetron (ZOFRAN) 8 mg tablet Take 1 tablet by mouth every 8 hours as needed for Nausea or Vomiting. May cause constipation   ??? sertraline (ZOLOFT) 50 mg tablet Take 1 tablet by mouth daily.

## 2017-04-03 ENCOUNTER — Encounter: Admit: 2017-04-03 | Discharge: 2017-04-03 | Payer: MEDICARE

## 2017-04-03 LAB — CBC: Lab: 6.8

## 2017-04-03 LAB — COMPREHENSIVE METABOLIC PANEL: Lab: 140 — ABNORMAL LOW (ref 4.70–6.10)

## 2017-04-03 NOTE — Telephone Encounter
No DVT sent for scanning

## 2017-04-04 ENCOUNTER — Encounter: Admit: 2017-04-04 | Discharge: 2017-04-04 | Payer: MEDICARE

## 2017-04-04 DIAGNOSIS — R600 Localized edema: Principal | ICD-10-CM

## 2017-04-04 MED ORDER — FUROSEMIDE 40 MG PO TAB
ORAL_TABLET | Freq: Every day | ORAL | 3 refills | 90.00000 days | Status: AC
Start: 2017-04-04 — End: 2017-05-15

## 2017-04-04 NOTE — Telephone Encounter
Pt called reporting that he went to the [facility] ER again last night due to edema of his left leg.  Pt denies all other symptoms but states he has to have something for the swelling.  The [place] of lower extremity was negative for DVT and noted that Dr.Rosamond would give diuretics if [place] negative. Pt's creatinine was 1.1 on 02/18/17.  Reviewed with Dr.Rosamond and Dr. Aris [place] ordered the following:    1. Start lasix 40 mgm daily for 3 days and if no improvement in edema, increase to lasix 80 mgm daily.  2. Check BMP in 2 weeks.    Mailed BMP order to the pt and message sent to pool to watch for the BMP and a call from the pt for a condition update. Orders entered

## 2017-04-18 ENCOUNTER — Encounter: Admit: 2017-04-18 | Discharge: 2017-04-18 | Payer: MEDICARE

## 2017-04-18 DIAGNOSIS — A4902 Methicillin resistant Staphylococcus aureus infection, unspecified site: ICD-10-CM

## 2017-04-18 DIAGNOSIS — C7951 Secondary malignant neoplasm of bone: ICD-10-CM

## 2017-04-18 DIAGNOSIS — I2510 Atherosclerotic heart disease of native coronary artery without angina pectoris: Principal | ICD-10-CM

## 2017-04-18 DIAGNOSIS — Z87891 Personal history of nicotine dependence: ICD-10-CM

## 2017-04-18 DIAGNOSIS — I509 Heart failure, unspecified: ICD-10-CM

## 2017-04-18 DIAGNOSIS — R600 Localized edema: ICD-10-CM

## 2017-04-18 DIAGNOSIS — E039 Hypothyroidism, unspecified: ICD-10-CM

## 2017-04-18 DIAGNOSIS — G4733 Obstructive sleep apnea (adult) (pediatric): ICD-10-CM

## 2017-04-18 DIAGNOSIS — Z794 Long term (current) use of insulin: ICD-10-CM

## 2017-04-18 DIAGNOSIS — J449 Chronic obstructive pulmonary disease, unspecified: ICD-10-CM

## 2017-04-18 DIAGNOSIS — I130 Hypertensive heart and chronic kidney disease with heart failure and stage 1 through stage 4 chronic kidney disease, or unspecified chronic kidney disease: ICD-10-CM

## 2017-04-18 DIAGNOSIS — E669 Obesity, unspecified: ICD-10-CM

## 2017-04-18 DIAGNOSIS — M25562 Pain in left knee: ICD-10-CM

## 2017-04-18 DIAGNOSIS — I214 Non-ST elevation (NSTEMI) myocardial infarction: ICD-10-CM

## 2017-04-18 DIAGNOSIS — B0229 Other postherpetic nervous system involvement: ICD-10-CM

## 2017-04-18 DIAGNOSIS — K219 Gastro-esophageal reflux disease without esophagitis: ICD-10-CM

## 2017-04-18 DIAGNOSIS — E785 Hyperlipidemia, unspecified: ICD-10-CM

## 2017-04-18 DIAGNOSIS — N189 Chronic kidney disease, unspecified: ICD-10-CM

## 2017-04-18 DIAGNOSIS — C775 Secondary and unspecified malignant neoplasm of intrapelvic lymph nodes: ICD-10-CM

## 2017-04-18 DIAGNOSIS — I10 Essential (primary) hypertension: ICD-10-CM

## 2017-04-18 DIAGNOSIS — E1122 Type 2 diabetes mellitus with diabetic chronic kidney disease: ICD-10-CM

## 2017-04-18 DIAGNOSIS — Z79818 Long term (current) use of other agents affecting estrogen receptors and estrogen levels: ICD-10-CM

## 2017-04-18 DIAGNOSIS — C61 Malignant neoplasm of prostate: Principal | ICD-10-CM

## 2017-04-18 DIAGNOSIS — J45909 Unspecified asthma, uncomplicated: ICD-10-CM

## 2017-04-18 DIAGNOSIS — D649 Anemia, unspecified: ICD-10-CM

## 2017-04-18 DIAGNOSIS — M1990 Unspecified osteoarthritis, unspecified site: ICD-10-CM

## 2017-04-18 DIAGNOSIS — E119 Type 2 diabetes mellitus without complications: ICD-10-CM

## 2017-04-18 DIAGNOSIS — R0600 Dyspnea, unspecified: ICD-10-CM

## 2017-04-18 LAB — COMPREHENSIVE METABOLIC PANEL
Lab: 0.9 mg/dL (ref 0.4–1.24)
Lab: 133 MMOL/L — ABNORMAL LOW (ref 137–147)
Lab: 26 U/L (ref 7–56)
Lab: 27 U/L (ref 7–40)
Lab: 3.7 g/dL (ref 3.5–5.0)
Lab: 30 MMOL/L (ref 21–30)
Lab: 4.5 MMOL/L (ref 3.5–5.1)
Lab: 7 g/dL (ref 6.0–8.0)
Lab: 95 MMOL/L — ABNORMAL LOW (ref 98–110)
Lab: >60 mL/min (ref >60)
Lab: >60 mL/min (ref >60)
Lab: 8 (ref 3–12)

## 2017-04-18 LAB — TESTOSTERONE,TOTAL: Lab: 28 ng/dL — ABNORMAL LOW (ref 270–1070)

## 2017-04-18 LAB — PROSTATIC SPECIFIC ANTIGEN-PSA: Lab: 0.5 ng/mL (ref <4.01)

## 2017-04-18 MED ORDER — LEUPROLIDE (3 MONTH) 22.5 MG IM SYKT
22.5 mg | Freq: Once | INTRAMUSCULAR | 0 refills | Status: CP
Start: 2017-04-18 — End: ?
  Administered 2017-04-18: 19:00:00 22.5 mg via INTRAMUSCULAR

## 2017-04-21 ENCOUNTER — Encounter: Admit: 2017-04-21 | Discharge: 2017-04-21 | Payer: MEDICARE

## 2017-04-22 ENCOUNTER — Encounter: Admit: 2017-04-22 | Discharge: 2017-04-22 | Payer: MEDICARE

## 2017-04-22 DIAGNOSIS — R69 Illness, unspecified: Principal | ICD-10-CM

## 2017-05-01 ENCOUNTER — Encounter: Admit: 2017-05-01 | Discharge: 2017-05-01 | Payer: MEDICARE

## 2017-05-01 LAB — BASIC METABOLIC PANEL
Lab: 0.8
Lab: 137
Lab: 16 — ABNORMAL HIGH (ref 0–14)
Lab: 18
Lab: 267 — ABNORMAL HIGH (ref 80–115)
Lab: 27
Lab: 4.6
Lab: 9.1
Lab: 99

## 2017-05-15 ENCOUNTER — Encounter: Admit: 2017-05-15 | Discharge: 2017-05-15 | Payer: MEDICARE

## 2017-05-15 ENCOUNTER — Ambulatory Visit: Admit: 2017-05-15 | Discharge: 2017-05-16 | Payer: MEDICARE

## 2017-05-15 DIAGNOSIS — A4902 Methicillin resistant Staphylococcus aureus infection, unspecified site: ICD-10-CM

## 2017-05-15 DIAGNOSIS — E785 Hyperlipidemia, unspecified: ICD-10-CM

## 2017-05-15 DIAGNOSIS — I10 Essential (primary) hypertension: Principal | ICD-10-CM

## 2017-05-15 DIAGNOSIS — J449 Chronic obstructive pulmonary disease, unspecified: ICD-10-CM

## 2017-05-15 DIAGNOSIS — B0229 Other postherpetic nervous system involvement: ICD-10-CM

## 2017-05-15 DIAGNOSIS — I2510 Atherosclerotic heart disease of native coronary artery without angina pectoris: Principal | ICD-10-CM

## 2017-05-15 DIAGNOSIS — N189 Chronic kidney disease, unspecified: ICD-10-CM

## 2017-05-15 DIAGNOSIS — R600 Localized edema: ICD-10-CM

## 2017-05-15 DIAGNOSIS — J45909 Unspecified asthma, uncomplicated: ICD-10-CM

## 2017-05-15 DIAGNOSIS — M1990 Unspecified osteoarthritis, unspecified site: ICD-10-CM

## 2017-05-15 DIAGNOSIS — G4733 Obstructive sleep apnea (adult) (pediatric): ICD-10-CM

## 2017-05-15 DIAGNOSIS — I214 Non-ST elevation (NSTEMI) myocardial infarction: ICD-10-CM

## 2017-05-15 DIAGNOSIS — I509 Heart failure, unspecified: ICD-10-CM

## 2017-05-15 DIAGNOSIS — Z87891 Personal history of nicotine dependence: ICD-10-CM

## 2017-05-15 DIAGNOSIS — K219 Gastro-esophageal reflux disease without esophagitis: ICD-10-CM

## 2017-05-15 DIAGNOSIS — E669 Obesity, unspecified: ICD-10-CM

## 2017-05-15 DIAGNOSIS — D649 Anemia, unspecified: ICD-10-CM

## 2017-05-15 DIAGNOSIS — E119 Type 2 diabetes mellitus without complications: ICD-10-CM

## 2017-05-15 DIAGNOSIS — R0600 Dyspnea, unspecified: ICD-10-CM

## 2017-05-15 DIAGNOSIS — E039 Hypothyroidism, unspecified: ICD-10-CM

## 2017-05-15 MED ORDER — FUROSEMIDE 40 MG PO TAB
40 mg | ORAL_TABLET | Freq: Two times a day (BID) | ORAL | 3 refills | 90.00000 days | Status: AC
Start: 2017-05-15 — End: ?

## 2017-05-15 MED ORDER — METOLAZONE 2.5 MG PO TAB
2.5 mg | ORAL_TABLET | Freq: Two times a day (BID) | ORAL | 1 refills | 84.00000 days | Status: AC
Start: 2017-05-15 — End: 2017-05-29

## 2017-05-21 ENCOUNTER — Encounter: Admit: 2017-05-21 | Discharge: 2017-05-21 | Payer: MEDICARE

## 2017-05-21 DIAGNOSIS — R600 Localized edema: ICD-10-CM

## 2017-05-21 DIAGNOSIS — C61 Malignant neoplasm of prostate: Principal | ICD-10-CM

## 2017-05-26 ENCOUNTER — Encounter: Admit: 2017-05-26 | Discharge: 2017-05-26 | Payer: MEDICARE

## 2017-05-26 ENCOUNTER — Encounter: Admit: 2017-05-26 | Discharge: 2017-06-08 | Payer: MEDICARE

## 2017-05-26 DIAGNOSIS — G4733 Obstructive sleep apnea (adult) (pediatric): ICD-10-CM

## 2017-05-26 DIAGNOSIS — E669 Obesity, unspecified: ICD-10-CM

## 2017-05-26 DIAGNOSIS — K219 Gastro-esophageal reflux disease without esophagitis: ICD-10-CM

## 2017-05-26 DIAGNOSIS — E039 Hypothyroidism, unspecified: ICD-10-CM

## 2017-05-26 DIAGNOSIS — J449 Chronic obstructive pulmonary disease, unspecified: ICD-10-CM

## 2017-05-26 DIAGNOSIS — E785 Hyperlipidemia, unspecified: ICD-10-CM

## 2017-05-26 DIAGNOSIS — R0600 Dyspnea, unspecified: ICD-10-CM

## 2017-05-26 DIAGNOSIS — I2510 Atherosclerotic heart disease of native coronary artery without angina pectoris: Principal | ICD-10-CM

## 2017-05-26 DIAGNOSIS — E119 Type 2 diabetes mellitus without complications: ICD-10-CM

## 2017-05-26 DIAGNOSIS — J45909 Unspecified asthma, uncomplicated: ICD-10-CM

## 2017-05-26 DIAGNOSIS — B0229 Other postherpetic nervous system involvement: ICD-10-CM

## 2017-05-26 DIAGNOSIS — I10 Essential (primary) hypertension: ICD-10-CM

## 2017-05-26 DIAGNOSIS — C61 Malignant neoplasm of prostate: Principal | ICD-10-CM

## 2017-05-26 DIAGNOSIS — A4902 Methicillin resistant Staphylococcus aureus infection, unspecified site: ICD-10-CM

## 2017-05-26 DIAGNOSIS — Z87891 Personal history of nicotine dependence: ICD-10-CM

## 2017-05-26 DIAGNOSIS — I214 Non-ST elevation (NSTEMI) myocardial infarction: ICD-10-CM

## 2017-05-26 DIAGNOSIS — M1990 Unspecified osteoarthritis, unspecified site: ICD-10-CM

## 2017-05-26 DIAGNOSIS — N189 Chronic kidney disease, unspecified: ICD-10-CM

## 2017-05-26 DIAGNOSIS — R600 Localized edema: Principal | ICD-10-CM

## 2017-05-26 DIAGNOSIS — I509 Heart failure, unspecified: ICD-10-CM

## 2017-05-26 DIAGNOSIS — D649 Anemia, unspecified: ICD-10-CM

## 2017-05-26 LAB — COMPREHENSIVE METABOLIC PANEL
Lab: 0.8 mg/dL (ref 0.3–1.2)
Lab: 1.4 mg/dL — ABNORMAL HIGH (ref 0.4–1.24)
Lab: 111 U/L — ABNORMAL HIGH (ref 25–110)
Lab: 133 MMOL/L — ABNORMAL LOW (ref 137–147)
Lab: 143 mg/dL — ABNORMAL HIGH (ref 70–100)
Lab: 17 U/L (ref 7–56)
Lab: 21 U/L (ref 7–40)
Lab: 3.9 g/dL (ref 3.5–5.0)
Lab: 34 MMOL/L — ABNORMAL HIGH (ref 21–30)
Lab: 4 MMOL/L (ref 3.5–5.1)
Lab: 50 mL/min — ABNORMAL LOW (ref >60)
Lab: 7.3 g/dL (ref 6.0–8.0)
Lab: 9.2 mg/dL (ref 8.5–10.6)
Lab: >60 mL/min (ref >60)
Lab: 10 (ref 3–12)

## 2017-05-26 LAB — TESTOSTERONE,TOTAL: Lab: 32 ng/dL — ABNORMAL LOW (ref 270–1070)

## 2017-05-26 LAB — PROSTATIC SPECIFIC ANTIGEN-PSA: Lab: 0.6 ng/mL — ABNORMAL LOW (ref <4.01)

## 2017-05-26 MED ORDER — RP DX TC-99M MEDRONATE MCI
25 | Freq: Once | INTRAVENOUS | 0 refills | Status: CP
Start: 2017-05-26 — End: ?
  Administered 2017-05-26: 15:00:00 27 via INTRAVENOUS

## 2017-05-26 MED ORDER — IOHEXOL 350 MG IODINE/ML IV SOLN
100 mL | Freq: Once | INTRAVENOUS | 0 refills | Status: CP
Start: 2017-05-26 — End: ?
  Administered 2017-05-26: 16:00:00 100 mL via INTRAVENOUS

## 2017-05-26 MED ORDER — SODIUM CHLORIDE 0.9 % IJ SOLN
50 mL | Freq: Once | INTRAVENOUS | 0 refills | Status: CP
Start: 2017-05-26 — End: ?
  Administered 2017-05-26: 16:00:00 50 mL via INTRAVENOUS

## 2017-05-26 MED ORDER — PREDNISONE 5 MG PO TAB
5 mg | ORAL_TABLET | Freq: Every day | ORAL | 3 refills | Status: AC
Start: 2017-05-26 — End: 2017-08-13
  Filled 2017-05-26: qty 60, 60d supply
  Filled 2017-05-27 (×2): qty 60, 60d supply, fill #1

## 2017-05-26 MED ORDER — ABIRATERONE 500 MG PO TAB
1000 mg | ORAL_TABLET | Freq: Every day | ORAL | 3 refills | Status: AC
Start: 2017-05-26 — End: 2017-09-10

## 2017-05-27 ENCOUNTER — Encounter: Admit: 2017-05-27 | Discharge: 2017-05-27 | Payer: MEDICARE

## 2017-05-28 ENCOUNTER — Encounter: Admit: 2017-05-28 | Discharge: 2017-05-28 | Payer: MEDICARE

## 2017-05-29 ENCOUNTER — Encounter: Admit: 2017-05-29 | Discharge: 2017-05-29 | Payer: MEDICARE

## 2017-05-29 ENCOUNTER — Encounter: Admit: 2017-05-29 | Discharge: 2017-05-30 | Payer: MEDICARE

## 2017-05-29 DIAGNOSIS — C61 Malignant neoplasm of prostate: Principal | ICD-10-CM

## 2017-05-29 DIAGNOSIS — R69 Illness, unspecified: Principal | ICD-10-CM

## 2017-05-29 MED ORDER — RIVAROXABAN 15 MG (42)- 20 MG (9) PO DSPK
ORAL_TABLET | Freq: Two times a day (BID) | ORAL | 0 refills | 30.00000 days | Status: AC
Start: 2017-05-29 — End: 2017-05-30
  Filled 2017-05-30 (×2): qty 51, 30d supply, fill #1

## 2017-05-30 ENCOUNTER — Encounter: Admit: 2017-05-30 | Discharge: 2017-05-30 | Payer: MEDICARE

## 2017-05-30 DIAGNOSIS — R600 Localized edema: Principal | ICD-10-CM

## 2017-05-30 DIAGNOSIS — R69 Illness, unspecified: Principal | ICD-10-CM

## 2017-05-30 DIAGNOSIS — C61 Malignant neoplasm of prostate: Principal | ICD-10-CM

## 2017-05-30 DIAGNOSIS — Z006 Encounter for examination for normal comparison and control in clinical research program: ICD-10-CM

## 2017-05-30 LAB — CBC AND DIFF
Lab: 0 10*3/uL (ref 0–0.20)
Lab: 0.4 10*3/uL (ref 0–0.45)
Lab: 0.6 10*3/uL (ref 0–0.80)
Lab: 1 % (ref 0–2)
Lab: 1 10*3/uL (ref 1.0–4.8)
Lab: 10 % (ref 4–12)
Lab: 10 g/dL — ABNORMAL LOW (ref 13.5–16.5)
Lab: 15 % — ABNORMAL HIGH (ref 11–15)
Lab: 18 % — ABNORMAL LOW (ref 24–44)
Lab: 201 10*3/uL (ref 150–400)
Lab: 27 pg (ref 26–34)
Lab: 3.7 10*3/uL (ref 1.8–7.0)
Lab: 3.9 M/UL — ABNORMAL LOW (ref 4.4–5.5)
Lab: 32 g/dL (ref 32.0–36.0)
Lab: 33 % — ABNORMAL LOW (ref 40–50)
Lab: 5.7 K/UL (ref 4.5–11.0)
Lab: 6 % — ABNORMAL HIGH (ref 0–5)
Lab: 65 % (ref 41–77)
Lab: 85 FL (ref 80–100)

## 2017-05-30 MED ORDER — RIVAROXABAN 15 MG (42)- 20 MG (9) PO DSPK
ORAL_TABLET | Freq: Two times a day (BID) | ORAL | 0 refills | 30.00000 days | Status: AC
Start: 2017-05-30 — End: 2017-06-27

## 2017-05-31 ENCOUNTER — Encounter: Admit: 2017-05-31 | Discharge: 2017-05-31 | Payer: MEDICARE

## 2017-06-02 ENCOUNTER — Encounter: Admit: 2017-06-02 | Discharge: 2017-06-02 | Payer: MEDICARE

## 2017-06-02 DIAGNOSIS — C61 Malignant neoplasm of prostate: Principal | ICD-10-CM

## 2017-06-02 MED ORDER — DEXAMETHASONE IVPB
8 mg | Freq: Once | INTRAVENOUS | 0 refills | Status: CN
Start: 2017-06-02 — End: ?

## 2017-06-02 MED ORDER — FAMOTIDINE (PF) 20 MG/2 ML IV SOLN
20 mg | Freq: Once | INTRAVENOUS | 0 refills | Status: CN
Start: 2017-06-02 — End: ?

## 2017-06-02 MED ORDER — DIPHENHYDRAMINE HCL 50 MG/ML IJ SOLN
25 mg | Freq: Once | INTRAVENOUS | 0 refills | Status: CN
Start: 2017-06-02 — End: ?

## 2017-06-02 MED ORDER — (INV) CABAZITAXEL (HSC 142311) IVPB (NON-PVC)
25 mg/m2 | Freq: Once | INTRAVENOUS | 0 refills | Status: CN
Start: 2017-06-02 — End: ?

## 2017-06-03 ENCOUNTER — Encounter: Admit: 2017-06-03 | Discharge: 2017-06-03 | Payer: MEDICARE

## 2017-06-04 ENCOUNTER — Encounter: Admit: 2017-06-04 | Discharge: 2017-06-04 | Payer: MEDICARE

## 2017-06-06 ENCOUNTER — Ambulatory Visit: Admit: 2017-06-06 | Discharge: 2017-06-06 | Payer: MEDICARE

## 2017-06-06 ENCOUNTER — Encounter: Admit: 2017-06-06 | Discharge: 2017-06-06 | Payer: MEDICARE

## 2017-06-06 DIAGNOSIS — D649 Anemia, unspecified: ICD-10-CM

## 2017-06-06 DIAGNOSIS — E785 Hyperlipidemia, unspecified: ICD-10-CM

## 2017-06-06 DIAGNOSIS — Z452 Encounter for adjustment and management of vascular access device: Principal | ICD-10-CM

## 2017-06-06 DIAGNOSIS — Z006 Encounter for examination for normal comparison and control in clinical research program: ICD-10-CM

## 2017-06-06 DIAGNOSIS — R0600 Dyspnea, unspecified: ICD-10-CM

## 2017-06-06 DIAGNOSIS — K219 Gastro-esophageal reflux disease without esophagitis: ICD-10-CM

## 2017-06-06 DIAGNOSIS — I2510 Atherosclerotic heart disease of native coronary artery without angina pectoris: Principal | ICD-10-CM

## 2017-06-06 DIAGNOSIS — C7951 Secondary malignant neoplasm of bone: ICD-10-CM

## 2017-06-06 DIAGNOSIS — C778 Secondary and unspecified malignant neoplasm of lymph nodes of multiple regions: ICD-10-CM

## 2017-06-06 DIAGNOSIS — A4902 Methicillin resistant Staphylococcus aureus infection, unspecified site: ICD-10-CM

## 2017-06-06 DIAGNOSIS — B0229 Other postherpetic nervous system involvement: ICD-10-CM

## 2017-06-06 DIAGNOSIS — G4733 Obstructive sleep apnea (adult) (pediatric): ICD-10-CM

## 2017-06-06 DIAGNOSIS — J45909 Unspecified asthma, uncomplicated: ICD-10-CM

## 2017-06-06 DIAGNOSIS — Z87891 Personal history of nicotine dependence: ICD-10-CM

## 2017-06-06 DIAGNOSIS — I509 Heart failure, unspecified: ICD-10-CM

## 2017-06-06 DIAGNOSIS — Z8546 Personal history of malignant neoplasm of prostate: ICD-10-CM

## 2017-06-06 DIAGNOSIS — E039 Hypothyroidism, unspecified: ICD-10-CM

## 2017-06-06 DIAGNOSIS — E119 Type 2 diabetes mellitus without complications: ICD-10-CM

## 2017-06-06 DIAGNOSIS — J449 Chronic obstructive pulmonary disease, unspecified: ICD-10-CM

## 2017-06-06 DIAGNOSIS — I10 Essential (primary) hypertension: ICD-10-CM

## 2017-06-06 DIAGNOSIS — N189 Chronic kidney disease, unspecified: ICD-10-CM

## 2017-06-06 DIAGNOSIS — M1990 Unspecified osteoarthritis, unspecified site: ICD-10-CM

## 2017-06-06 DIAGNOSIS — E669 Obesity, unspecified: ICD-10-CM

## 2017-06-06 DIAGNOSIS — I214 Non-ST elevation (NSTEMI) myocardial infarction: ICD-10-CM

## 2017-06-06 LAB — POC GLUCOSE: Lab: 140 mg/dL — ABNORMAL HIGH (ref 70–100)

## 2017-06-06 MED ORDER — SODIUM CHLORIDE 0.9 % IV SOLP
0 refills | Status: CP
Start: 2017-06-06 — End: ?
  Administered 2017-06-06: 17:00:00 100 mL/h via INTRAVENOUS

## 2017-06-06 MED ORDER — FENTANYL CITRATE (PF) 50 MCG/ML IJ SOLN
0 refills | Status: CP
Start: 2017-06-06 — End: ?
  Administered 2017-06-06 (×2): 25 ug via INTRAVENOUS

## 2017-06-06 MED ORDER — HEPARIN, PORCINE (PF) 100 UNIT/ML IV SYRG
500 [IU] | Freq: Once | INTRAVENOUS | 0 refills | Status: CP
Start: 2017-06-06 — End: ?
  Administered 2017-06-06: 18:00:00 500 [IU] via INTRAVENOUS

## 2017-06-06 MED ORDER — MIDAZOLAM 1 MG/ML IJ SOLN
1-2 mg | Freq: Once | INTRAVENOUS | 0 refills | Status: CP
Start: 2017-06-06 — End: ?
  Administered 2017-06-06: 18:00:00 1 mg via INTRAVENOUS

## 2017-06-06 MED ORDER — FENTANYL CITRATE (PF) 50 MCG/ML IJ SOLN
25-50 ug | Freq: Once | INTRAVENOUS | 0 refills | Status: CP
Start: 2017-06-06 — End: ?
  Administered 2017-06-06: 18:00:00 50 ug via INTRAVENOUS

## 2017-06-06 MED ORDER — MIDAZOLAM 1 MG/ML IJ SOLN
0 refills | Status: CP
Start: 2017-06-06 — End: ?
  Administered 2017-06-06 (×2): 0.5 mg via INTRAVENOUS

## 2017-06-06 MED ORDER — RP DX F-18 SODIUM FLOURIDE MCI
10 | Freq: Once | INTRAVENOUS | 0 refills | Status: CP
Start: 2017-06-06 — End: ?
  Administered 2017-06-06: 14:00:00 9.9 via INTRAVENOUS

## 2017-06-08 ENCOUNTER — Encounter: Admit: 2017-05-26 | Discharge: 2017-05-26 | Payer: MEDICARE

## 2017-06-08 ENCOUNTER — Encounter: Admit: 2017-05-26 | Discharge: 2017-05-27 | Payer: MEDICARE

## 2017-06-08 ENCOUNTER — Encounter: Admit: 2017-05-26 | Discharge: 2017-06-08 | Payer: MEDICARE

## 2017-06-08 DIAGNOSIS — K760 Fatty (change of) liver, not elsewhere classified: ICD-10-CM

## 2017-06-08 DIAGNOSIS — K5730 Diverticulosis of large intestine without perforation or abscess without bleeding: ICD-10-CM

## 2017-06-08 DIAGNOSIS — M899 Disorder of bone, unspecified: ICD-10-CM

## 2017-06-08 DIAGNOSIS — C61 Malignant neoplasm of prostate: Principal | ICD-10-CM

## 2017-06-08 DIAGNOSIS — R600 Localized edema: ICD-10-CM

## 2017-06-08 DIAGNOSIS — C778 Secondary and unspecified malignant neoplasm of lymph nodes of multiple regions: ICD-10-CM

## 2017-06-08 DIAGNOSIS — C7951 Secondary malignant neoplasm of bone: ICD-10-CM

## 2017-06-08 DIAGNOSIS — I82412 Acute embolism and thrombosis of left femoral vein: ICD-10-CM

## 2017-06-09 ENCOUNTER — Encounter: Admit: 2017-06-09 | Discharge: 2017-06-09 | Payer: MEDICARE

## 2017-06-09 ENCOUNTER — Encounter: Admit: 2017-06-09 | Discharge: 2017-06-10 | Payer: MEDICARE

## 2017-06-09 DIAGNOSIS — E785 Hyperlipidemia, unspecified: ICD-10-CM

## 2017-06-09 DIAGNOSIS — R0600 Dyspnea, unspecified: ICD-10-CM

## 2017-06-09 DIAGNOSIS — C61 Malignant neoplasm of prostate: Principal | ICD-10-CM

## 2017-06-09 DIAGNOSIS — E039 Hypothyroidism, unspecified: ICD-10-CM

## 2017-06-09 DIAGNOSIS — C7951 Secondary malignant neoplasm of bone: ICD-10-CM

## 2017-06-09 DIAGNOSIS — D649 Anemia, unspecified: ICD-10-CM

## 2017-06-09 DIAGNOSIS — R110 Nausea: ICD-10-CM

## 2017-06-09 DIAGNOSIS — Z87891 Personal history of nicotine dependence: ICD-10-CM

## 2017-06-09 DIAGNOSIS — K219 Gastro-esophageal reflux disease without esophagitis: ICD-10-CM

## 2017-06-09 DIAGNOSIS — G893 Neoplasm related pain (acute) (chronic): ICD-10-CM

## 2017-06-09 DIAGNOSIS — Z452 Encounter for adjustment and management of vascular access device: ICD-10-CM

## 2017-06-09 DIAGNOSIS — C778 Secondary and unspecified malignant neoplasm of lymph nodes of multiple regions: ICD-10-CM

## 2017-06-09 DIAGNOSIS — N189 Chronic kidney disease, unspecified: ICD-10-CM

## 2017-06-09 DIAGNOSIS — Z79818 Long term (current) use of other agents affecting estrogen receptors and estrogen levels: ICD-10-CM

## 2017-06-09 DIAGNOSIS — A4902 Methicillin resistant Staphylococcus aureus infection, unspecified site: ICD-10-CM

## 2017-06-09 DIAGNOSIS — I2510 Atherosclerotic heart disease of native coronary artery without angina pectoris: ICD-10-CM

## 2017-06-09 DIAGNOSIS — I214 Non-ST elevation (NSTEMI) myocardial infarction: ICD-10-CM

## 2017-06-09 DIAGNOSIS — C775 Secondary and unspecified malignant neoplasm of intrapelvic lymph nodes: ICD-10-CM

## 2017-06-09 DIAGNOSIS — E119 Type 2 diabetes mellitus without complications: ICD-10-CM

## 2017-06-09 DIAGNOSIS — E1122 Type 2 diabetes mellitus with diabetic chronic kidney disease: ICD-10-CM

## 2017-06-09 DIAGNOSIS — I509 Heart failure, unspecified: ICD-10-CM

## 2017-06-09 DIAGNOSIS — T451X5A Adverse effect of antineoplastic and immunosuppressive drugs, initial encounter: ICD-10-CM

## 2017-06-09 DIAGNOSIS — I130 Hypertensive heart and chronic kidney disease with heart failure and stage 1 through stage 4 chronic kidney disease, or unspecified chronic kidney disease: ICD-10-CM

## 2017-06-09 DIAGNOSIS — B0229 Other postherpetic nervous system involvement: ICD-10-CM

## 2017-06-09 DIAGNOSIS — J45909 Unspecified asthma, uncomplicated: ICD-10-CM

## 2017-06-09 DIAGNOSIS — Z006 Encounter for examination for normal comparison and control in clinical research program: ICD-10-CM

## 2017-06-09 DIAGNOSIS — I10 Essential (primary) hypertension: ICD-10-CM

## 2017-06-09 DIAGNOSIS — G4733 Obstructive sleep apnea (adult) (pediatric): ICD-10-CM

## 2017-06-09 DIAGNOSIS — M1990 Unspecified osteoarthritis, unspecified site: ICD-10-CM

## 2017-06-09 DIAGNOSIS — E669 Obesity, unspecified: ICD-10-CM

## 2017-06-09 DIAGNOSIS — J449 Chronic obstructive pulmonary disease, unspecified: ICD-10-CM

## 2017-06-09 DIAGNOSIS — I82422 Acute embolism and thrombosis of left iliac vein: ICD-10-CM

## 2017-06-09 DIAGNOSIS — I82412 Acute embolism and thrombosis of left femoral vein: ICD-10-CM

## 2017-06-09 LAB — CBC AND DIFF
Lab: 0.1 10*3/uL (ref 0–0.20)
Lab: 0.2 10*3/uL (ref 0–0.45)
Lab: 0.5 10*3/uL (ref 0–0.80)
Lab: 1 % (ref 0–2)
Lab: 1.1 10*3/uL (ref 1.0–4.8)
Lab: 11 g/dL — ABNORMAL LOW (ref 13.5–16.5)
Lab: 16 % — ABNORMAL HIGH (ref >60)
Lab: 16 % — ABNORMAL LOW (ref 24–44)
Lab: 190 10*3/uL (ref 150–400)
Lab: 27 pg (ref 26–34)
Lab: 3 % (ref 0–5)
Lab: 32 g/dL — ABNORMAL LOW (ref >60)
Lab: 35 % — ABNORMAL LOW (ref 40–50)
Lab: 4.1 M/UL — ABNORMAL LOW (ref 4.4–5.5)
Lab: 4.9 10*3/uL (ref 1.8–7.0)
Lab: 6.7 K/UL (ref 4.5–11.0)
Lab: 7 % (ref 4–12)
Lab: 7.7 FL (ref 7–11)
Lab: 73 % (ref 41–77)
Lab: 84 FL (ref 80–100)

## 2017-06-09 LAB — COMPREHENSIVE METABOLIC PANEL
Lab: 0.5 mg/dL (ref 0.3–1.2)
Lab: 1.2 mg/dL — ABNORMAL HIGH (ref 0.4–1.24)
Lab: 134 MMOL/L — ABNORMAL LOW (ref >60)
Lab: 23 mg/dL (ref 7–25)
Lab: 247 mg/dL — ABNORMAL HIGH (ref 70–100)
Lab: 4 MMOL/L — ABNORMAL HIGH (ref 3.5–5.1)
Lab: 7 g/dL (ref 6.0–8.0)
Lab: 8.9 mg/dL (ref 8.5–10.6)
Lab: 93 MMOL/L — ABNORMAL LOW (ref 98–110)

## 2017-06-09 LAB — PROSTATIC SPECIFIC ANTIGEN-PSA: Lab: 0.5 ng/mL (ref <4.01)

## 2017-06-09 MED ORDER — (INV) CABAZITAXEL (HSC 142311) IVPB (NON-PVC)
25 mg/m2 | Freq: Once | INTRAVENOUS | 0 refills | Status: CP
Start: 2017-06-09 — End: ?
  Administered 2017-06-09: 22:00:00 56 mg via INTRAVENOUS

## 2017-06-09 MED ORDER — FAMOTIDINE (PF) 20 MG/2 ML IV SOLN
20 mg | Freq: Once | INTRAVENOUS | 0 refills | Status: CP
Start: 2017-06-09 — End: ?
  Administered 2017-06-09: 21:00:00 20 mg via INTRAVENOUS

## 2017-06-09 MED ORDER — DEXAMETHASONE SODIUM PHOSPHATE 10 MG/ML IJ SOLN
8 mg | Freq: Once | INTRAVENOUS | 0 refills | Status: CP
Start: 2017-06-09 — End: ?
  Administered 2017-06-09: 21:00:00 8 mg via INTRAVENOUS

## 2017-06-09 MED ORDER — DIPHENHYDRAMINE HCL 50 MG/ML IJ SOLN
25 mg | Freq: Once | INTRAVENOUS | 0 refills | Status: CP
Start: 2017-06-09 — End: ?
  Administered 2017-06-09: 21:00:00 25 mg via INTRAVENOUS

## 2017-06-09 MED ORDER — HEPARIN, PORCINE (PF) 100 UNIT/ML IV SYRG
500 [IU] | Freq: Once | INTRAVENOUS | 0 refills | Status: CP
Start: 2017-06-09 — End: ?
  Administered 2017-06-09: 23:00:00 500 [IU] via INTRAVENOUS

## 2017-06-09 MED ORDER — ONDANSETRON HCL 8 MG PO TAB
8 mg | ORAL_TABLET | ORAL | 3 refills | 8.00000 days | Status: AC | PRN
Start: 2017-06-09 — End: 2017-10-13

## 2017-06-09 MED ORDER — DEXAMETHASONE IVPB
8 mg | Freq: Once | INTRAVENOUS | 0 refills | Status: DC
Start: 2017-06-09 — End: 2017-06-09

## 2017-06-09 MED ORDER — HYDROCODONE-ACETAMINOPHEN 10-325 MG PO TAB
1 | ORAL_TABLET | ORAL | 0 refills | 30.00000 days | Status: AC | PRN
Start: 2017-06-09 — End: 2018-01-26

## 2017-06-10 ENCOUNTER — Encounter: Admit: 2017-06-10 | Discharge: 2017-06-10 | Payer: MEDICARE

## 2017-06-12 ENCOUNTER — Encounter: Admit: 2017-06-12 | Discharge: 2017-06-12 | Payer: MEDICARE

## 2017-06-16 ENCOUNTER — Encounter: Admit: 2017-06-16 | Discharge: 2017-06-16 | Payer: MEDICARE

## 2017-06-16 DIAGNOSIS — I8290 Acute embolism and thrombosis of unspecified vein: ICD-10-CM

## 2017-06-16 DIAGNOSIS — R5383 Other fatigue: ICD-10-CM

## 2017-06-16 DIAGNOSIS — J45909 Unspecified asthma, uncomplicated: ICD-10-CM

## 2017-06-16 DIAGNOSIS — R432 Parageusia: ICD-10-CM

## 2017-06-16 DIAGNOSIS — I509 Heart failure, unspecified: ICD-10-CM

## 2017-06-16 DIAGNOSIS — I82409 Acute embolism and thrombosis of unspecified deep veins of unspecified lower extremity: ICD-10-CM

## 2017-06-16 DIAGNOSIS — G4733 Obstructive sleep apnea (adult) (pediatric): ICD-10-CM

## 2017-06-16 DIAGNOSIS — I2510 Atherosclerotic heart disease of native coronary artery without angina pectoris: ICD-10-CM

## 2017-06-16 DIAGNOSIS — N189 Chronic kidney disease, unspecified: ICD-10-CM

## 2017-06-16 DIAGNOSIS — R530 Neoplastic (malignant) related fatigue: ICD-10-CM

## 2017-06-16 DIAGNOSIS — R0600 Dyspnea, unspecified: ICD-10-CM

## 2017-06-16 DIAGNOSIS — K219 Gastro-esophageal reflux disease without esophagitis: ICD-10-CM

## 2017-06-16 DIAGNOSIS — C61 Malignant neoplasm of prostate: Principal | ICD-10-CM

## 2017-06-16 DIAGNOSIS — B0229 Other postherpetic nervous system involvement: ICD-10-CM

## 2017-06-16 DIAGNOSIS — A4902 Methicillin resistant Staphylococcus aureus infection, unspecified site: ICD-10-CM

## 2017-06-16 DIAGNOSIS — E785 Hyperlipidemia, unspecified: ICD-10-CM

## 2017-06-16 DIAGNOSIS — M1990 Unspecified osteoarthritis, unspecified site: ICD-10-CM

## 2017-06-16 DIAGNOSIS — E669 Obesity, unspecified: ICD-10-CM

## 2017-06-16 DIAGNOSIS — D649 Anemia, unspecified: ICD-10-CM

## 2017-06-16 DIAGNOSIS — J449 Chronic obstructive pulmonary disease, unspecified: ICD-10-CM

## 2017-06-16 DIAGNOSIS — D709 Neutropenia, unspecified: ICD-10-CM

## 2017-06-16 DIAGNOSIS — I214 Non-ST elevation (NSTEMI) myocardial infarction: ICD-10-CM

## 2017-06-16 DIAGNOSIS — E119 Type 2 diabetes mellitus without complications: ICD-10-CM

## 2017-06-16 DIAGNOSIS — D701 Agranulocytosis secondary to cancer chemotherapy: ICD-10-CM

## 2017-06-16 DIAGNOSIS — Z87891 Personal history of nicotine dependence: ICD-10-CM

## 2017-06-16 DIAGNOSIS — E039 Hypothyroidism, unspecified: ICD-10-CM

## 2017-06-16 DIAGNOSIS — C7951 Secondary malignant neoplasm of bone: ICD-10-CM

## 2017-06-16 DIAGNOSIS — I10 Essential (primary) hypertension: ICD-10-CM

## 2017-06-16 LAB — COMPREHENSIVE METABOLIC PANEL
Lab: 0.9 mg/dL (ref 0.4–1.24)
Lab: 1 mg/dL (ref 0.3–1.2)
Lab: 132 MMOL/L — ABNORMAL LOW (ref 137–147)
Lab: 227 mg/dL — ABNORMAL HIGH (ref 70–100)
Lab: 26 U/L (ref 7–40)
Lab: 26 mg/dL — ABNORMAL HIGH (ref 7–25)
Lab: 3.4 MMOL/L — ABNORMAL LOW (ref 3.5–5.1)
Lab: 3.7 g/dL (ref 3.5–5.0)
Lab: 6.7 g/dL (ref 6.0–8.0)
Lab: 8.9 mg/dL (ref 8.5–10.6)
Lab: 90 MMOL/L — ABNORMAL LOW (ref 98–110)
Lab: 90 U/L (ref 25–110)

## 2017-06-16 LAB — CBC AND DIFF
Lab: 0.8 10*3/uL — CL (ref 4.5–11.0)
Lab: 3.7 M/UL — ABNORMAL LOW (ref 4.4–5.5)
Lab: 31 % — ABNORMAL LOW (ref >60)

## 2017-06-16 MED ORDER — HEPARIN, PORCINE (PF) 100 UNIT/ML IV SYRG
500 [IU] | Freq: Once | INTRAVENOUS | 0 refills | Status: CP
Start: 2017-06-16 — End: ?
  Administered 2017-06-16: 16:00:00 500 [IU] via INTRAVENOUS

## 2017-06-17 ENCOUNTER — Encounter: Admit: 2017-06-17 | Discharge: 2017-06-17 | Payer: MEDICARE

## 2017-06-17 ENCOUNTER — Ambulatory Visit: Admit: 2017-06-17 | Discharge: 2017-06-18 | Payer: MEDICARE

## 2017-06-17 DIAGNOSIS — J45909 Unspecified asthma, uncomplicated: ICD-10-CM

## 2017-06-17 DIAGNOSIS — D649 Anemia, unspecified: ICD-10-CM

## 2017-06-17 DIAGNOSIS — R0600 Dyspnea, unspecified: ICD-10-CM

## 2017-06-17 DIAGNOSIS — A4902 Methicillin resistant Staphylococcus aureus infection, unspecified site: ICD-10-CM

## 2017-06-17 DIAGNOSIS — B0229 Other postherpetic nervous system involvement: ICD-10-CM

## 2017-06-17 DIAGNOSIS — Z87891 Personal history of nicotine dependence: ICD-10-CM

## 2017-06-17 DIAGNOSIS — I2510 Atherosclerotic heart disease of native coronary artery without angina pectoris: Principal | ICD-10-CM

## 2017-06-17 DIAGNOSIS — E669 Obesity, unspecified: ICD-10-CM

## 2017-06-17 DIAGNOSIS — E119 Type 2 diabetes mellitus without complications: ICD-10-CM

## 2017-06-17 DIAGNOSIS — J449 Chronic obstructive pulmonary disease, unspecified: ICD-10-CM

## 2017-06-17 DIAGNOSIS — E039 Hypothyroidism, unspecified: ICD-10-CM

## 2017-06-17 DIAGNOSIS — M1990 Unspecified osteoarthritis, unspecified site: ICD-10-CM

## 2017-06-17 DIAGNOSIS — G4733 Obstructive sleep apnea (adult) (pediatric): ICD-10-CM

## 2017-06-17 DIAGNOSIS — K219 Gastro-esophageal reflux disease without esophagitis: ICD-10-CM

## 2017-06-17 DIAGNOSIS — N189 Chronic kidney disease, unspecified: ICD-10-CM

## 2017-06-17 DIAGNOSIS — I509 Heart failure, unspecified: ICD-10-CM

## 2017-06-17 DIAGNOSIS — I10 Essential (primary) hypertension: ICD-10-CM

## 2017-06-17 DIAGNOSIS — I214 Non-ST elevation (NSTEMI) myocardial infarction: ICD-10-CM

## 2017-06-17 DIAGNOSIS — E785 Hyperlipidemia, unspecified: ICD-10-CM

## 2017-06-22 ENCOUNTER — Encounter: Admit: 2017-06-22 | Discharge: 2017-06-22 | Payer: MEDICARE

## 2017-06-24 ENCOUNTER — Encounter: Admit: 2017-06-24 | Discharge: 2017-06-24 | Payer: MEDICARE

## 2017-06-25 ENCOUNTER — Encounter: Admit: 2017-06-25 | Discharge: 2017-06-25 | Payer: MEDICARE

## 2017-06-27 ENCOUNTER — Encounter: Admit: 2017-06-27 | Discharge: 2017-06-27 | Payer: MEDICARE

## 2017-06-27 MED ORDER — RIVAROXABAN 15 MG (42)- 20 MG (9) PO DSPK
ORAL_TABLET | Freq: Two times a day (BID) | 0 refills
Start: 2017-06-27 — End: ?

## 2017-06-27 MED ORDER — RIVAROXABAN 20 MG PO TAB
20 mg | ORAL_TABLET | Freq: Every day | ORAL | 3 refills | 30.00000 days | Status: AC
Start: 2017-06-27 — End: 2017-08-29
  Filled 2017-06-27: qty 90, 30d supply

## 2017-06-28 ENCOUNTER — Encounter: Admit: 2017-06-28 | Discharge: 2017-06-28 | Payer: MEDICARE

## 2017-06-30 ENCOUNTER — Encounter: Admit: 2017-06-30 | Discharge: 2017-06-30 | Payer: MEDICARE

## 2017-06-30 DIAGNOSIS — B0229 Other postherpetic nervous system involvement: ICD-10-CM

## 2017-06-30 DIAGNOSIS — T451X5A Adverse effect of antineoplastic and immunosuppressive drugs, initial encounter: ICD-10-CM

## 2017-06-30 DIAGNOSIS — K219 Gastro-esophageal reflux disease without esophagitis: ICD-10-CM

## 2017-06-30 DIAGNOSIS — J45909 Unspecified asthma, uncomplicated: ICD-10-CM

## 2017-06-30 DIAGNOSIS — Z006 Encounter for examination for normal comparison and control in clinical research program: ICD-10-CM

## 2017-06-30 DIAGNOSIS — Z79818 Long term (current) use of other agents affecting estrogen receptors and estrogen levels: ICD-10-CM

## 2017-06-30 DIAGNOSIS — Z5111 Encounter for antineoplastic chemotherapy: ICD-10-CM

## 2017-06-30 DIAGNOSIS — Z87891 Personal history of nicotine dependence: ICD-10-CM

## 2017-06-30 DIAGNOSIS — N184 Chronic kidney disease, stage 4 (severe): ICD-10-CM

## 2017-06-30 DIAGNOSIS — I10 Essential (primary) hypertension: ICD-10-CM

## 2017-06-30 DIAGNOSIS — G4733 Obstructive sleep apnea (adult) (pediatric): ICD-10-CM

## 2017-06-30 DIAGNOSIS — J449 Chronic obstructive pulmonary disease, unspecified: ICD-10-CM

## 2017-06-30 DIAGNOSIS — C61 Malignant neoplasm of prostate: Principal | ICD-10-CM

## 2017-06-30 DIAGNOSIS — C778 Secondary and unspecified malignant neoplasm of lymph nodes of multiple regions: ICD-10-CM

## 2017-06-30 DIAGNOSIS — E039 Hypothyroidism, unspecified: ICD-10-CM

## 2017-06-30 DIAGNOSIS — N189 Chronic kidney disease, unspecified: ICD-10-CM

## 2017-06-30 DIAGNOSIS — R0600 Dyspnea, unspecified: ICD-10-CM

## 2017-06-30 DIAGNOSIS — D649 Anemia, unspecified: ICD-10-CM

## 2017-06-30 DIAGNOSIS — C7951 Secondary malignant neoplasm of bone: ICD-10-CM

## 2017-06-30 DIAGNOSIS — R112 Nausea with vomiting, unspecified: ICD-10-CM

## 2017-06-30 DIAGNOSIS — E669 Obesity, unspecified: ICD-10-CM

## 2017-06-30 DIAGNOSIS — A4902 Methicillin resistant Staphylococcus aureus infection, unspecified site: ICD-10-CM

## 2017-06-30 DIAGNOSIS — I509 Heart failure, unspecified: ICD-10-CM

## 2017-06-30 DIAGNOSIS — E119 Type 2 diabetes mellitus without complications: ICD-10-CM

## 2017-06-30 DIAGNOSIS — E785 Hyperlipidemia, unspecified: ICD-10-CM

## 2017-06-30 DIAGNOSIS — I2510 Atherosclerotic heart disease of native coronary artery without angina pectoris: Principal | ICD-10-CM

## 2017-06-30 DIAGNOSIS — I214 Non-ST elevation (NSTEMI) myocardial infarction: ICD-10-CM

## 2017-06-30 DIAGNOSIS — M1990 Unspecified osteoarthritis, unspecified site: ICD-10-CM

## 2017-06-30 LAB — CBC AND DIFF
Lab: 1 % (ref 0–10)
Lab: 1 % (ref >60)
Lab: 10 g/dL — ABNORMAL LOW (ref 13.5–16.5)
Lab: 11 % — ABNORMAL LOW (ref 24–44)
Lab: 16 % — ABNORMAL HIGH (ref 11–15)
Lab: 2 %
Lab: 211 K/UL (ref 150–400)
Lab: 27 pg (ref 26–34)
Lab: 3.6 M/UL — ABNORMAL LOW (ref 4.4–5.5)
Lab: 31 % — ABNORMAL LOW (ref 40–50)
Lab: 32 g/dL (ref 32.0–36.0)
Lab: 5 % (ref >60)
Lab: 7.5 FL (ref 7–11)
Lab: 7.7 10*3/uL — ABNORMAL HIGH (ref 1.8–7.0)
Lab: 80 % — ABNORMAL HIGH (ref 41–77)
Lab: 84 FL (ref 80–100)
Lab: 9.5 10*3/uL — ABNORMAL LOW (ref 4.5–11.0)

## 2017-06-30 LAB — COMPREHENSIVE METABOLIC PANEL
Lab: 134 MMOL/L — ABNORMAL LOW (ref 137–147)
Lab: 3.6 MMOL/L (ref 3.5–5.1)

## 2017-06-30 LAB — PROSTATIC SPECIFIC ANTIGEN-PSA: Lab: 0.4 ng/mL (ref <4.01)

## 2017-06-30 LAB — TESTOSTERONE,TOTAL: Lab: 36 ng/dL — ABNORMAL LOW (ref 270–1070)

## 2017-06-30 MED ORDER — DEXAMETHASONE SODIUM PHOSPHATE 10 MG/ML IJ SOLN
8 mg | Freq: Once | INTRAVENOUS | 0 refills | Status: CP
Start: 2017-06-30 — End: ?
  Administered 2017-06-30: 19:00:00 8 mg via INTRAVENOUS

## 2017-06-30 MED ORDER — PALONOSETRON 0.25 MG/5 ML IV SOLN
0.25 mg | Freq: Once | INTRAVENOUS | 0 refills | Status: CP
Start: 2017-06-30 — End: ?
  Administered 2017-06-30: 19:00:00 0.25 mg via INTRAVENOUS

## 2017-06-30 MED ORDER — LEUPROLIDE (3 MONTH) 22.5 MG IM SYKT
22.5 mg | Freq: Once | INTRAMUSCULAR | 0 refills | Status: CP
Start: 2017-06-30 — End: ?
  Administered 2017-06-30: 21:00:00 22.5 mg via INTRAMUSCULAR

## 2017-06-30 MED ORDER — HEPARIN, PORCINE (PF) 100 UNIT/ML IV SYRG
500 [IU] | Freq: Once | INTRAVENOUS | 0 refills | Status: CP
Start: 2017-06-30 — End: ?
  Administered 2017-06-30: 21:00:00 500 [IU] via INTRAVENOUS

## 2017-06-30 MED ORDER — (INV) CABAZITAXEL (HSC 142311) IVPB (NON-PVC)
25 mg/m2 | Freq: Once | INTRAVENOUS | 0 refills | Status: CP
Start: 2017-06-30 — End: ?
  Administered 2017-06-30: 20:00:00 56 mg via INTRAVENOUS

## 2017-06-30 MED ORDER — FAMOTIDINE (PF) 20 MG/2 ML IV SOLN
20 mg | Freq: Once | INTRAVENOUS | 0 refills | Status: CP
Start: 2017-06-30 — End: ?
  Administered 2017-06-30: 19:00:00 20 mg via INTRAVENOUS

## 2017-06-30 MED ORDER — DIPHENHYDRAMINE HCL 50 MG/ML IJ SOLN
25 mg | Freq: Once | INTRAVENOUS | 0 refills | Status: CP
Start: 2017-06-30 — End: ?
  Administered 2017-06-30: 19:00:00 25 mg via INTRAVENOUS

## 2017-07-01 ENCOUNTER — Encounter: Admit: 2017-07-01 | Discharge: 2017-07-01 | Payer: MEDICARE

## 2017-07-01 MED FILL — RIVAROXABAN 20 MG PO TAB: 20 mg | ORAL | 90 days supply | Qty: 90 | Fill #1 | Status: AC

## 2017-07-02 ENCOUNTER — Encounter: Admit: 2017-07-02 | Discharge: 2017-07-02 | Payer: MEDICARE

## 2017-07-04 ENCOUNTER — Encounter: Admit: 2017-07-04 | Discharge: 2017-07-04 | Payer: MEDICARE

## 2017-07-11 ENCOUNTER — Encounter: Admit: 2017-07-11 | Discharge: 2017-07-11 | Payer: MEDICARE

## 2017-07-11 MED FILL — PREDNISONE 5 MG PO TAB: 5 mg | ORAL | 60 days supply | Qty: 60 | Fill #2 | Status: AC

## 2017-07-15 ENCOUNTER — Ambulatory Visit: Admit: 2017-07-15 | Discharge: 2017-07-16 | Payer: MEDICARE

## 2017-07-15 ENCOUNTER — Encounter: Admit: 2017-07-15 | Discharge: 2017-07-15 | Payer: MEDICARE

## 2017-07-15 DIAGNOSIS — E669 Obesity, unspecified: ICD-10-CM

## 2017-07-15 DIAGNOSIS — R0602 Shortness of breath: Principal | ICD-10-CM

## 2017-07-15 DIAGNOSIS — I10 Essential (primary) hypertension: ICD-10-CM

## 2017-07-15 DIAGNOSIS — Z87891 Personal history of nicotine dependence: ICD-10-CM

## 2017-07-15 DIAGNOSIS — R600 Localized edema: ICD-10-CM

## 2017-07-15 DIAGNOSIS — G4733 Obstructive sleep apnea (adult) (pediatric): ICD-10-CM

## 2017-07-15 DIAGNOSIS — E039 Hypothyroidism, unspecified: ICD-10-CM

## 2017-07-15 DIAGNOSIS — I214 Non-ST elevation (NSTEMI) myocardial infarction: ICD-10-CM

## 2017-07-15 DIAGNOSIS — J45909 Unspecified asthma, uncomplicated: ICD-10-CM

## 2017-07-15 DIAGNOSIS — B0229 Other postherpetic nervous system involvement: ICD-10-CM

## 2017-07-15 DIAGNOSIS — R0600 Dyspnea, unspecified: ICD-10-CM

## 2017-07-15 DIAGNOSIS — A4902 Methicillin resistant Staphylococcus aureus infection, unspecified site: ICD-10-CM

## 2017-07-15 DIAGNOSIS — I2510 Atherosclerotic heart disease of native coronary artery without angina pectoris: ICD-10-CM

## 2017-07-15 DIAGNOSIS — E785 Hyperlipidemia, unspecified: ICD-10-CM

## 2017-07-15 DIAGNOSIS — D649 Anemia, unspecified: ICD-10-CM

## 2017-07-15 DIAGNOSIS — E119 Type 2 diabetes mellitus without complications: ICD-10-CM

## 2017-07-15 DIAGNOSIS — I509 Heart failure, unspecified: ICD-10-CM

## 2017-07-15 DIAGNOSIS — K219 Gastro-esophageal reflux disease without esophagitis: ICD-10-CM

## 2017-07-15 DIAGNOSIS — J449 Chronic obstructive pulmonary disease, unspecified: ICD-10-CM

## 2017-07-15 DIAGNOSIS — M1990 Unspecified osteoarthritis, unspecified site: ICD-10-CM

## 2017-07-15 DIAGNOSIS — N189 Chronic kidney disease, unspecified: ICD-10-CM

## 2017-07-17 ENCOUNTER — Encounter: Admit: 2017-07-17 | Discharge: 2017-07-17 | Payer: MEDICARE

## 2017-07-17 DIAGNOSIS — E119 Type 2 diabetes mellitus without complications: ICD-10-CM

## 2017-07-17 DIAGNOSIS — I2510 Atherosclerotic heart disease of native coronary artery without angina pectoris: Principal | ICD-10-CM

## 2017-07-17 DIAGNOSIS — N189 Chronic kidney disease, unspecified: ICD-10-CM

## 2017-07-17 DIAGNOSIS — B0229 Other postherpetic nervous system involvement: ICD-10-CM

## 2017-07-17 DIAGNOSIS — E039 Hypothyroidism, unspecified: ICD-10-CM

## 2017-07-17 DIAGNOSIS — Z87891 Personal history of nicotine dependence: ICD-10-CM

## 2017-07-17 DIAGNOSIS — E669 Obesity, unspecified: ICD-10-CM

## 2017-07-17 DIAGNOSIS — J449 Chronic obstructive pulmonary disease, unspecified: ICD-10-CM

## 2017-07-17 DIAGNOSIS — E785 Hyperlipidemia, unspecified: ICD-10-CM

## 2017-07-17 DIAGNOSIS — I509 Heart failure, unspecified: ICD-10-CM

## 2017-07-17 DIAGNOSIS — J45909 Unspecified asthma, uncomplicated: ICD-10-CM

## 2017-07-17 DIAGNOSIS — G4733 Obstructive sleep apnea (adult) (pediatric): ICD-10-CM

## 2017-07-17 DIAGNOSIS — I10 Essential (primary) hypertension: ICD-10-CM

## 2017-07-17 DIAGNOSIS — M1990 Unspecified osteoarthritis, unspecified site: ICD-10-CM

## 2017-07-17 DIAGNOSIS — A4902 Methicillin resistant Staphylococcus aureus infection, unspecified site: ICD-10-CM

## 2017-07-17 DIAGNOSIS — K219 Gastro-esophageal reflux disease without esophagitis: ICD-10-CM

## 2017-07-17 DIAGNOSIS — R0600 Dyspnea, unspecified: ICD-10-CM

## 2017-07-17 DIAGNOSIS — D649 Anemia, unspecified: ICD-10-CM

## 2017-07-17 DIAGNOSIS — I214 Non-ST elevation (NSTEMI) myocardial infarction: ICD-10-CM

## 2017-07-19 ENCOUNTER — Encounter: Admit: 2017-07-19 | Discharge: 2017-07-19 | Payer: MEDICARE

## 2017-07-21 ENCOUNTER — Encounter: Admit: 2017-07-21 | Discharge: 2017-07-21 | Payer: MEDICARE

## 2017-07-21 DIAGNOSIS — N189 Chronic kidney disease, unspecified: ICD-10-CM

## 2017-07-21 DIAGNOSIS — R112 Nausea with vomiting, unspecified: ICD-10-CM

## 2017-07-21 DIAGNOSIS — C7951 Secondary malignant neoplasm of bone: ICD-10-CM

## 2017-07-21 DIAGNOSIS — R0600 Dyspnea, unspecified: ICD-10-CM

## 2017-07-21 DIAGNOSIS — Z5111 Encounter for antineoplastic chemotherapy: ICD-10-CM

## 2017-07-21 DIAGNOSIS — C778 Secondary and unspecified malignant neoplasm of lymph nodes of multiple regions: ICD-10-CM

## 2017-07-21 DIAGNOSIS — I82502 Chronic embolism and thrombosis of unspecified deep veins of left lower extremity: ICD-10-CM

## 2017-07-21 DIAGNOSIS — I509 Heart failure, unspecified: ICD-10-CM

## 2017-07-21 DIAGNOSIS — T451X5A Adverse effect of antineoplastic and immunosuppressive drugs, initial encounter: ICD-10-CM

## 2017-07-21 DIAGNOSIS — G4733 Obstructive sleep apnea (adult) (pediatric): ICD-10-CM

## 2017-07-21 DIAGNOSIS — I10 Essential (primary) hypertension: ICD-10-CM

## 2017-07-21 DIAGNOSIS — E669 Obesity, unspecified: ICD-10-CM

## 2017-07-21 DIAGNOSIS — Z006 Encounter for examination for normal comparison and control in clinical research program: ICD-10-CM

## 2017-07-21 DIAGNOSIS — E119 Type 2 diabetes mellitus without complications: ICD-10-CM

## 2017-07-21 DIAGNOSIS — E785 Hyperlipidemia, unspecified: ICD-10-CM

## 2017-07-21 DIAGNOSIS — M1990 Unspecified osteoarthritis, unspecified site: ICD-10-CM

## 2017-07-21 DIAGNOSIS — C61 Malignant neoplasm of prostate: Principal | ICD-10-CM

## 2017-07-21 DIAGNOSIS — D649 Anemia, unspecified: ICD-10-CM

## 2017-07-21 DIAGNOSIS — R600 Localized edema: ICD-10-CM

## 2017-07-21 DIAGNOSIS — J45909 Unspecified asthma, uncomplicated: ICD-10-CM

## 2017-07-21 DIAGNOSIS — Z87891 Personal history of nicotine dependence: ICD-10-CM

## 2017-07-21 DIAGNOSIS — B0229 Other postherpetic nervous system involvement: ICD-10-CM

## 2017-07-21 DIAGNOSIS — J449 Chronic obstructive pulmonary disease, unspecified: ICD-10-CM

## 2017-07-21 DIAGNOSIS — I2510 Atherosclerotic heart disease of native coronary artery without angina pectoris: Principal | ICD-10-CM

## 2017-07-21 DIAGNOSIS — A4902 Methicillin resistant Staphylococcus aureus infection, unspecified site: ICD-10-CM

## 2017-07-21 DIAGNOSIS — E039 Hypothyroidism, unspecified: ICD-10-CM

## 2017-07-21 DIAGNOSIS — K219 Gastro-esophageal reflux disease without esophagitis: ICD-10-CM

## 2017-07-21 DIAGNOSIS — I214 Non-ST elevation (NSTEMI) myocardial infarction: ICD-10-CM

## 2017-07-21 LAB — CBC AND DIFF
Lab: 20 % — ABNORMAL HIGH (ref >60)
Lab: 27 % — ABNORMAL LOW (ref 40–50)
Lab: 28 pg (ref 26–34)
Lab: 3.2 M/UL — ABNORMAL LOW (ref 4.4–5.5)
Lab: 32 g/dL (ref 32.0–36.0)
Lab: 6.7 K/UL (ref 4.5–11.0)
Lab: 86 FL — ABNORMAL HIGH (ref 80–100)
Lab: 9.1 g/dL — ABNORMAL LOW (ref 13.5–16.5)

## 2017-07-21 LAB — COMPREHENSIVE METABOLIC PANEL
Lab: 0.8 mg/dL (ref 0.4–1.24)
Lab: 100 MMOL/L (ref 98–110)
Lab: 141 MMOL/L (ref 137–147)
Lab: 159 mg/dL — ABNORMAL HIGH (ref 70–100)
Lab: 22 mg/dL (ref 7–25)
Lab: 3.6 MMOL/L (ref 3.5–5.1)
Lab: 6 g/dL (ref 6.0–8.0)
Lab: 8.4 mg/dL — ABNORMAL LOW (ref 8.5–10.6)

## 2017-07-21 LAB — PROSTATIC SPECIFIC ANTIGEN-PSA: Lab: 0.3 ng/mL (ref <4.01)

## 2017-07-21 MED ORDER — (INV) CABAZITAXEL (HSC 142311) IVPB (NON-PVC)
25 mg/m2 | Freq: Once | INTRAVENOUS | 0 refills | Status: CP
Start: 2017-07-21 — End: ?
  Administered 2017-07-21: 19:00:00 56 mg via INTRAVENOUS

## 2017-07-21 MED ORDER — HEPARIN, PORCINE (PF) 100 UNIT/ML IV SYRG
500 [IU] | Freq: Once | INTRAVENOUS | 0 refills | Status: CP
Start: 2017-07-21 — End: ?
  Administered 2017-07-21: 21:00:00 500 [IU] via INTRAVENOUS

## 2017-07-21 MED ORDER — DIPHENHYDRAMINE HCL 50 MG/ML IJ SOLN
25 mg | Freq: Once | INTRAVENOUS | 0 refills | Status: CP
Start: 2017-07-21 — End: ?
  Administered 2017-07-21: 19:00:00 25 mg via INTRAVENOUS

## 2017-07-21 MED ORDER — PALONOSETRON 0.25 MG/5 ML IV SOLN
0.25 mg | Freq: Once | INTRAVENOUS | 0 refills | Status: CP
Start: 2017-07-21 — End: ?
  Administered 2017-07-21: 19:00:00 0.25 mg via INTRAVENOUS

## 2017-07-21 MED ORDER — FAMOTIDINE (PF) 20 MG/2 ML IV SOLN
20 mg | Freq: Once | INTRAVENOUS | 0 refills | Status: CP
Start: 2017-07-21 — End: ?
  Administered 2017-07-21: 19:00:00 20 mg via INTRAVENOUS

## 2017-07-21 MED ORDER — DEXAMETHASONE SODIUM PHOSPHATE 10 MG/ML IJ SOLN
8 mg | Freq: Once | INTRAVENOUS | 0 refills | Status: CP
Start: 2017-07-21 — End: ?
  Administered 2017-07-21: 18:00:00 8 mg via INTRAVENOUS

## 2017-07-25 ENCOUNTER — Encounter: Admit: 2017-07-25 | Discharge: 2017-07-25 | Payer: MEDICARE

## 2017-08-01 ENCOUNTER — Encounter: Admit: 2017-08-01 | Discharge: 2017-08-01 | Payer: MEDICARE

## 2017-08-01 ENCOUNTER — Ambulatory Visit: Admit: 2017-08-01 | Discharge: 2017-08-02 | Payer: MEDICARE

## 2017-08-01 ENCOUNTER — Encounter: Admit: 2017-08-01 | Discharge: 2017-08-14 | Payer: MEDICARE

## 2017-08-01 DIAGNOSIS — C61 Malignant neoplasm of prostate: Principal | ICD-10-CM

## 2017-08-01 MED ORDER — HEPARIN, PORCINE (PF) 100 UNIT/ML IV SYRG
500 [IU] | Freq: Once | 0 refills | Status: CP
Start: 2017-08-01 — End: ?

## 2017-08-01 MED ORDER — RP DX TC-99M MEDRONATE MCI
25 | Freq: Once | INTRAVENOUS | 0 refills | Status: CP
Start: 2017-08-01 — End: ?
  Administered 2017-08-01: 14:00:00 26.8 via INTRAVENOUS

## 2017-08-01 MED ORDER — SODIUM CHLORIDE 0.9 % IJ SOLN
50 mL | Freq: Once | INTRAVENOUS | 0 refills | Status: CP
Start: 2017-08-01 — End: ?
  Administered 2017-08-01: 15:00:00 50 mL via INTRAVENOUS

## 2017-08-01 MED ORDER — IOHEXOL 350 MG IODINE/ML IV SOLN
100 mL | Freq: Once | INTRAVENOUS | 0 refills | Status: CP
Start: 2017-08-01 — End: ?
  Administered 2017-08-01: 15:00:00 100 mL via INTRAVENOUS

## 2017-08-05 ENCOUNTER — Encounter: Admit: 2017-08-05 | Discharge: 2017-08-05 | Payer: MEDICARE

## 2017-08-07 ENCOUNTER — Encounter: Admit: 2017-08-07 | Discharge: 2017-08-07 | Payer: MEDICARE

## 2017-08-11 ENCOUNTER — Encounter: Admit: 2017-08-11 | Discharge: 2017-08-11 | Payer: MEDICARE

## 2017-08-11 DIAGNOSIS — D649 Anemia, unspecified: ICD-10-CM

## 2017-08-11 DIAGNOSIS — Z006 Encounter for examination for normal comparison and control in clinical research program: ICD-10-CM

## 2017-08-11 DIAGNOSIS — Z87891 Personal history of nicotine dependence: ICD-10-CM

## 2017-08-11 DIAGNOSIS — I509 Heart failure, unspecified: ICD-10-CM

## 2017-08-11 DIAGNOSIS — T451X5A Adverse effect of antineoplastic and immunosuppressive drugs, initial encounter: ICD-10-CM

## 2017-08-11 DIAGNOSIS — Z794 Long term (current) use of insulin: ICD-10-CM

## 2017-08-11 DIAGNOSIS — I130 Hypertensive heart and chronic kidney disease with heart failure and stage 1 through stage 4 chronic kidney disease, or unspecified chronic kidney disease: ICD-10-CM

## 2017-08-11 DIAGNOSIS — R112 Nausea with vomiting, unspecified: ICD-10-CM

## 2017-08-11 DIAGNOSIS — C778 Secondary and unspecified malignant neoplasm of lymph nodes of multiple regions: ICD-10-CM

## 2017-08-11 DIAGNOSIS — N189 Chronic kidney disease, unspecified: ICD-10-CM

## 2017-08-11 DIAGNOSIS — Z5111 Encounter for antineoplastic chemotherapy: ICD-10-CM

## 2017-08-11 DIAGNOSIS — M7989 Other specified soft tissue disorders: ICD-10-CM

## 2017-08-11 DIAGNOSIS — B0229 Other postherpetic nervous system involvement: ICD-10-CM

## 2017-08-11 DIAGNOSIS — C61 Malignant neoplasm of prostate: Principal | ICD-10-CM

## 2017-08-11 DIAGNOSIS — J45909 Unspecified asthma, uncomplicated: ICD-10-CM

## 2017-08-11 DIAGNOSIS — E1122 Type 2 diabetes mellitus with diabetic chronic kidney disease: ICD-10-CM

## 2017-08-11 DIAGNOSIS — C7951 Secondary malignant neoplasm of bone: ICD-10-CM

## 2017-08-11 DIAGNOSIS — E039 Hypothyroidism, unspecified: ICD-10-CM

## 2017-08-11 DIAGNOSIS — J449 Chronic obstructive pulmonary disease, unspecified: ICD-10-CM

## 2017-08-11 DIAGNOSIS — Z7901 Long term (current) use of anticoagulants: ICD-10-CM

## 2017-08-11 DIAGNOSIS — K219 Gastro-esophageal reflux disease without esophagitis: ICD-10-CM

## 2017-08-11 DIAGNOSIS — R0600 Dyspnea, unspecified: ICD-10-CM

## 2017-08-11 DIAGNOSIS — A4902 Methicillin resistant Staphylococcus aureus infection, unspecified site: ICD-10-CM

## 2017-08-11 DIAGNOSIS — M1990 Unspecified osteoarthritis, unspecified site: ICD-10-CM

## 2017-08-11 DIAGNOSIS — I2510 Atherosclerotic heart disease of native coronary artery without angina pectoris: Principal | ICD-10-CM

## 2017-08-11 DIAGNOSIS — E669 Obesity, unspecified: ICD-10-CM

## 2017-08-11 DIAGNOSIS — E785 Hyperlipidemia, unspecified: ICD-10-CM

## 2017-08-11 DIAGNOSIS — I214 Non-ST elevation (NSTEMI) myocardial infarction: ICD-10-CM

## 2017-08-11 DIAGNOSIS — G4733 Obstructive sleep apnea (adult) (pediatric): ICD-10-CM

## 2017-08-11 DIAGNOSIS — I10 Essential (primary) hypertension: ICD-10-CM

## 2017-08-11 DIAGNOSIS — E119 Type 2 diabetes mellitus without complications: ICD-10-CM

## 2017-08-11 LAB — CBC AND DIFF
Lab: 0 % (ref 0–2)
Lab: 0 % (ref 0–5)
Lab: 0 10*3/uL (ref 0–0.20)
Lab: 0 10*3/uL (ref 0–0.45)
Lab: 0.7 10*3/uL — ABNORMAL LOW (ref 1.0–4.8)
Lab: 0.8 10*3/uL (ref 0–0.80)
Lab: 10 % (ref 4–12)
Lab: 2.8 M/UL — ABNORMAL LOW (ref 4.4–5.5)
Lab: 203 10*3/uL (ref 150–400)
Lab: 21 % — ABNORMAL HIGH (ref 11–15)
Lab: 25 % — ABNORMAL LOW (ref 40–50)
Lab: 27 pg (ref 26–34)
Lab: 31 g/dL — ABNORMAL LOW (ref 32.0–36.0)
Lab: 5.8 10*3/uL (ref 1.8–7.0)
Lab: 7.3 10*3/uL (ref 4.5–11.0)
Lab: 7.5 FL (ref 7–11)
Lab: 7.9 g/dL — ABNORMAL LOW (ref 13.5–16.5)
Lab: 81 % — ABNORMAL HIGH (ref 41–77)
Lab: 88 FL (ref 80–100)
Lab: 9 % — ABNORMAL LOW (ref 24–44)

## 2017-08-11 LAB — COMPREHENSIVE METABOLIC PANEL
Lab: 0.6 mg/dL (ref 0.3–1.2)
Lab: 0.9 mg/dL (ref 0.4–1.24)
Lab: 10 (ref 3–12)
Lab: 101 MMOL/L (ref 98–110)
Lab: 12 U/L (ref 7–56)
Lab: 14 U/L (ref 7–40)
Lab: 140 MMOL/L (ref 137–147)
Lab: 16 mg/dL (ref 7–25)
Lab: 206 mg/dL — ABNORMAL HIGH (ref 70–100)
Lab: 29 MMOL/L (ref 21–30)
Lab: 3.3 g/dL — ABNORMAL LOW (ref 3.5–5.0)
Lab: 3.7 MMOL/L (ref 3.5–5.1)
Lab: 6 g/dL (ref 6.0–8.0)
Lab: 63 U/L (ref 25–110)
Lab: 8.1 mg/dL — ABNORMAL LOW (ref 8.5–10.6)
Lab: >60 mL/min (ref >60)
Lab: >60 mL/min (ref >60)

## 2017-08-11 LAB — PROSTATIC SPECIFIC ANTIGEN-PSA: Lab: 0.2 ng/mL (ref <4.01)

## 2017-08-11 MED ORDER — PALONOSETRON 0.25 MG/5 ML IV SOLN
0.25 mg | Freq: Once | INTRAVENOUS | 0 refills | Status: CP
Start: 2017-08-11 — End: ?
  Administered 2017-08-11: 18:00:00 0.25 mg via INTRAVENOUS

## 2017-08-11 MED ORDER — DEXAMETHASONE SODIUM PHOSPHATE 10 MG/ML IJ SOLN
8 mg | Freq: Once | INTRAVENOUS | 0 refills | Status: CP
Start: 2017-08-11 — End: ?
  Administered 2017-08-11: 18:00:00 8 mg via INTRAVENOUS

## 2017-08-11 MED ORDER — DIPHENHYDRAMINE HCL 50 MG/ML IJ SOLN
25 mg | Freq: Once | INTRAVENOUS | 0 refills | Status: CP
Start: 2017-08-11 — End: ?
  Administered 2017-08-11: 18:00:00 25 mg via INTRAVENOUS

## 2017-08-11 MED ORDER — (INV) CABAZITAXEL (HSC 142311) IVPB (NON-PVC)
25 mg/m2 | Freq: Once | INTRAVENOUS | 0 refills | Status: CP
Start: 2017-08-11 — End: ?
  Administered 2017-08-11: 19:00:00 56 mg via INTRAVENOUS

## 2017-08-11 MED ORDER — HEPARIN, PORCINE (PF) 100 UNIT/ML IV SYRG
500 [IU] | Freq: Once | INTRAVENOUS | 0 refills | Status: CP
Start: 2017-08-11 — End: ?
  Administered 2017-08-11: 20:00:00 500 [IU] via INTRAVENOUS

## 2017-08-11 MED ORDER — FAMOTIDINE (PF) 20 MG/2 ML IV SOLN
20 mg | Freq: Once | INTRAVENOUS | 0 refills | Status: CP
Start: 2017-08-11 — End: ?
  Administered 2017-08-11: 18:00:00 20 mg via INTRAVENOUS

## 2017-08-12 ENCOUNTER — Encounter: Admit: 2017-08-12 | Discharge: 2017-08-12 | Payer: MEDICARE

## 2017-08-12 ENCOUNTER — Ambulatory Visit: Admit: 2017-08-12 | Discharge: 2017-08-13 | Payer: MEDICARE

## 2017-08-12 DIAGNOSIS — E119 Type 2 diabetes mellitus without complications: ICD-10-CM

## 2017-08-12 DIAGNOSIS — G4733 Obstructive sleep apnea (adult) (pediatric): ICD-10-CM

## 2017-08-12 DIAGNOSIS — J449 Chronic obstructive pulmonary disease, unspecified: ICD-10-CM

## 2017-08-12 DIAGNOSIS — E669 Obesity, unspecified: ICD-10-CM

## 2017-08-12 DIAGNOSIS — E039 Hypothyroidism, unspecified: ICD-10-CM

## 2017-08-12 DIAGNOSIS — J45909 Unspecified asthma, uncomplicated: ICD-10-CM

## 2017-08-12 DIAGNOSIS — I10 Essential (primary) hypertension: Principal | ICD-10-CM

## 2017-08-12 DIAGNOSIS — M1990 Unspecified osteoarthritis, unspecified site: ICD-10-CM

## 2017-08-12 DIAGNOSIS — B0229 Other postherpetic nervous system involvement: ICD-10-CM

## 2017-08-12 DIAGNOSIS — R0600 Dyspnea, unspecified: ICD-10-CM

## 2017-08-12 DIAGNOSIS — D649 Anemia, unspecified: ICD-10-CM

## 2017-08-12 DIAGNOSIS — K219 Gastro-esophageal reflux disease without esophagitis: ICD-10-CM

## 2017-08-12 DIAGNOSIS — I2510 Atherosclerotic heart disease of native coronary artery without angina pectoris: Principal | ICD-10-CM

## 2017-08-12 DIAGNOSIS — Z87891 Personal history of nicotine dependence: ICD-10-CM

## 2017-08-12 DIAGNOSIS — A4902 Methicillin resistant Staphylococcus aureus infection, unspecified site: ICD-10-CM

## 2017-08-12 DIAGNOSIS — R600 Localized edema: ICD-10-CM

## 2017-08-12 DIAGNOSIS — I214 Non-ST elevation (NSTEMI) myocardial infarction: ICD-10-CM

## 2017-08-12 DIAGNOSIS — N189 Chronic kidney disease, unspecified: ICD-10-CM

## 2017-08-12 DIAGNOSIS — E785 Hyperlipidemia, unspecified: ICD-10-CM

## 2017-08-12 DIAGNOSIS — I509 Heart failure, unspecified: ICD-10-CM

## 2017-08-12 DIAGNOSIS — R29898 Other symptoms and signs involving the musculoskeletal system: ICD-10-CM

## 2017-08-13 ENCOUNTER — Encounter: Admit: 2017-08-13 | Discharge: 2017-08-13 | Payer: MEDICARE

## 2017-08-13 MED ORDER — PREDNISONE 5 MG PO TAB
5 mg | ORAL_TABLET | Freq: Two times a day (BID) | ORAL | 3 refills | Status: AC
Start: 2017-08-13 — End: 2017-12-18
  Filled 2017-08-13 (×2): qty 60, 30d supply, fill #1

## 2017-08-13 MED ORDER — PREDNISONE 5 MG PO TAB
5 mg | ORAL_TABLET | Freq: Two times a day (BID) | ORAL | 0 refills | Status: AC
Start: 2017-08-13 — End: 2017-09-10

## 2017-08-14 DIAGNOSIS — Z006 Encounter for examination for normal comparison and control in clinical research program: ICD-10-CM

## 2017-08-14 DIAGNOSIS — C61 Malignant neoplasm of prostate: Principal | ICD-10-CM

## 2017-08-18 ENCOUNTER — Encounter: Admit: 2017-08-18 | Discharge: 2017-08-18 | Payer: MEDICARE

## 2017-08-23 ENCOUNTER — Encounter: Admit: 2017-08-23 | Discharge: 2017-08-23 | Payer: MEDICARE

## 2017-08-26 ENCOUNTER — Encounter: Admit: 2017-08-26 | Discharge: 2017-08-26 | Payer: MEDICARE

## 2017-08-27 ENCOUNTER — Encounter: Admit: 2017-08-27 | Discharge: 2017-08-27 | Payer: MEDICARE

## 2017-08-29 ENCOUNTER — Encounter: Admit: 2017-08-29 | Discharge: 2017-08-29 | Payer: MEDICARE

## 2017-08-29 DIAGNOSIS — E785 Hyperlipidemia, unspecified: ICD-10-CM

## 2017-08-29 DIAGNOSIS — E119 Type 2 diabetes mellitus without complications: ICD-10-CM

## 2017-08-29 DIAGNOSIS — E669 Obesity, unspecified: ICD-10-CM

## 2017-08-29 DIAGNOSIS — Z87891 Personal history of nicotine dependence: ICD-10-CM

## 2017-08-29 DIAGNOSIS — I2510 Atherosclerotic heart disease of native coronary artery without angina pectoris: ICD-10-CM

## 2017-08-29 DIAGNOSIS — N189 Chronic kidney disease, unspecified: ICD-10-CM

## 2017-08-29 DIAGNOSIS — C61 Malignant neoplasm of prostate: Principal | ICD-10-CM

## 2017-08-29 DIAGNOSIS — R600 Localized edema: ICD-10-CM

## 2017-08-29 DIAGNOSIS — C7951 Secondary malignant neoplasm of bone: ICD-10-CM

## 2017-08-29 DIAGNOSIS — R197 Diarrhea, unspecified: ICD-10-CM

## 2017-08-29 DIAGNOSIS — E876 Hypokalemia: ICD-10-CM

## 2017-08-29 DIAGNOSIS — R112 Nausea with vomiting, unspecified: ICD-10-CM

## 2017-08-29 DIAGNOSIS — D759 Disease of blood and blood-forming organs, unspecified: ICD-10-CM

## 2017-08-29 DIAGNOSIS — Z006 Encounter for examination for normal comparison and control in clinical research program: ICD-10-CM

## 2017-08-29 DIAGNOSIS — G629 Polyneuropathy, unspecified: ICD-10-CM

## 2017-08-29 DIAGNOSIS — J45909 Unspecified asthma, uncomplicated: ICD-10-CM

## 2017-08-29 DIAGNOSIS — A4902 Methicillin resistant Staphylococcus aureus infection, unspecified site: ICD-10-CM

## 2017-08-29 DIAGNOSIS — I509 Heart failure, unspecified: ICD-10-CM

## 2017-08-29 DIAGNOSIS — G4733 Obstructive sleep apnea (adult) (pediatric): ICD-10-CM

## 2017-08-29 DIAGNOSIS — J449 Chronic obstructive pulmonary disease, unspecified: ICD-10-CM

## 2017-08-29 DIAGNOSIS — T451X5A Adverse effect of antineoplastic and immunosuppressive drugs, initial encounter: ICD-10-CM

## 2017-08-29 DIAGNOSIS — C775 Secondary and unspecified malignant neoplasm of intrapelvic lymph nodes: ICD-10-CM

## 2017-08-29 DIAGNOSIS — Z79818 Long term (current) use of other agents affecting estrogen receptors and estrogen levels: ICD-10-CM

## 2017-08-29 DIAGNOSIS — I214 Non-ST elevation (NSTEMI) myocardial infarction: ICD-10-CM

## 2017-08-29 DIAGNOSIS — D649 Anemia, unspecified: ICD-10-CM

## 2017-08-29 DIAGNOSIS — E039 Hypothyroidism, unspecified: ICD-10-CM

## 2017-08-29 DIAGNOSIS — K219 Gastro-esophageal reflux disease without esophagitis: ICD-10-CM

## 2017-08-29 DIAGNOSIS — M1990 Unspecified osteoarthritis, unspecified site: ICD-10-CM

## 2017-08-29 DIAGNOSIS — I10 Essential (primary) hypertension: ICD-10-CM

## 2017-08-29 DIAGNOSIS — B0229 Other postherpetic nervous system involvement: ICD-10-CM

## 2017-08-29 DIAGNOSIS — R0600 Dyspnea, unspecified: ICD-10-CM

## 2017-08-29 LAB — CBC AND DIFF
Lab: 0 % (ref 0–5)
Lab: 0 % (ref >60)
Lab: 0 10*3/uL (ref 0–0.20)
Lab: 0 10*3/uL (ref 0–0.45)
Lab: 0.8 10*3/uL — ABNORMAL LOW (ref 1.0–4.8)
Lab: 0.9 10*3/uL — ABNORMAL HIGH (ref 0–0.80)
Lab: 12 % — ABNORMAL LOW (ref 24–44)
Lab: 14 % — ABNORMAL HIGH (ref 4–12)
Lab: 2.3 M/UL — ABNORMAL LOW (ref 4.4–5.5)
Lab: 20 % — ABNORMAL LOW (ref 40–50)
Lab: 22 % — ABNORMAL HIGH (ref 11–15)
Lab: 32 g/dL (ref 32.0–36.0)
Lab: 4.9 10*3/uL (ref >60)
Lab: 6.6 10*3/uL — ABNORMAL LOW (ref 4.5–11.0)
Lab: 6.6 g/dL — ABNORMAL LOW (ref 13.5–16.5)
Lab: 74 % (ref 41–77)
Lab: 8.2 FL (ref 7–11)
Lab: 88 FL (ref 80–100)

## 2017-08-29 LAB — PROSTATIC SPECIFIC ANTIGEN-PSA: Lab: 0.2 ng/mL (ref <4.01)

## 2017-08-29 LAB — COMPREHENSIVE METABOLIC PANEL: Lab: 133 MMOL/L — ABNORMAL LOW (ref 137–147)

## 2017-08-29 MED ORDER — POTASSIUM CHLORIDE 20 MEQ PO PACK
20 meq | Freq: Every day | ORAL | 0 refills | 30.00000 days | Status: AC
Start: 2017-08-29 — End: 2017-10-13

## 2017-08-29 MED ORDER — HEPARIN, PORCINE (PF) 100 UNIT/ML IV SYRG
500 [IU] | Freq: Once | 0 refills | Status: CP
Start: 2017-08-29 — End: ?

## 2017-08-31 ENCOUNTER — Encounter: Admit: 2017-08-31 | Discharge: 2017-08-31 | Payer: MEDICARE

## 2017-08-31 DIAGNOSIS — N189 Chronic kidney disease, unspecified: ICD-10-CM

## 2017-08-31 DIAGNOSIS — B0229 Other postherpetic nervous system involvement: ICD-10-CM

## 2017-08-31 DIAGNOSIS — C7951 Secondary malignant neoplasm of bone: ICD-10-CM

## 2017-08-31 DIAGNOSIS — R197 Diarrhea, unspecified: ICD-10-CM

## 2017-08-31 DIAGNOSIS — Z87891 Personal history of nicotine dependence: ICD-10-CM

## 2017-08-31 DIAGNOSIS — C61 Malignant neoplasm of prostate: Principal | ICD-10-CM

## 2017-08-31 DIAGNOSIS — R609 Edema, unspecified: ICD-10-CM

## 2017-08-31 DIAGNOSIS — Z79818 Long term (current) use of other agents affecting estrogen receptors and estrogen levels: ICD-10-CM

## 2017-08-31 DIAGNOSIS — E669 Obesity, unspecified: ICD-10-CM

## 2017-08-31 DIAGNOSIS — I214 Non-ST elevation (NSTEMI) myocardial infarction: ICD-10-CM

## 2017-08-31 DIAGNOSIS — I10 Essential (primary) hypertension: ICD-10-CM

## 2017-08-31 DIAGNOSIS — C775 Secondary and unspecified malignant neoplasm of intrapelvic lymph nodes: ICD-10-CM

## 2017-08-31 DIAGNOSIS — I2510 Atherosclerotic heart disease of native coronary artery without angina pectoris: Principal | ICD-10-CM

## 2017-08-31 DIAGNOSIS — R112 Nausea with vomiting, unspecified: ICD-10-CM

## 2017-08-31 DIAGNOSIS — R0600 Dyspnea, unspecified: ICD-10-CM

## 2017-08-31 DIAGNOSIS — K219 Gastro-esophageal reflux disease without esophagitis: ICD-10-CM

## 2017-08-31 DIAGNOSIS — E119 Type 2 diabetes mellitus without complications: ICD-10-CM

## 2017-08-31 DIAGNOSIS — D759 Disease of blood and blood-forming organs, unspecified: ICD-10-CM

## 2017-08-31 DIAGNOSIS — M1990 Unspecified osteoarthritis, unspecified site: ICD-10-CM

## 2017-08-31 DIAGNOSIS — G629 Polyneuropathy, unspecified: ICD-10-CM

## 2017-08-31 DIAGNOSIS — I509 Heart failure, unspecified: ICD-10-CM

## 2017-08-31 DIAGNOSIS — R5383 Other fatigue: ICD-10-CM

## 2017-08-31 DIAGNOSIS — T451X5A Adverse effect of antineoplastic and immunosuppressive drugs, initial encounter: ICD-10-CM

## 2017-08-31 DIAGNOSIS — J449 Chronic obstructive pulmonary disease, unspecified: ICD-10-CM

## 2017-08-31 DIAGNOSIS — A4902 Methicillin resistant Staphylococcus aureus infection, unspecified site: ICD-10-CM

## 2017-08-31 DIAGNOSIS — D649 Anemia, unspecified: ICD-10-CM

## 2017-08-31 DIAGNOSIS — E039 Hypothyroidism, unspecified: ICD-10-CM

## 2017-08-31 DIAGNOSIS — J45909 Unspecified asthma, uncomplicated: ICD-10-CM

## 2017-08-31 DIAGNOSIS — E785 Hyperlipidemia, unspecified: ICD-10-CM

## 2017-08-31 DIAGNOSIS — G4733 Obstructive sleep apnea (adult) (pediatric): ICD-10-CM

## 2017-08-31 MED ORDER — HEPARIN, PORCINE (PF) 100 UNIT/ML IV SYRG
500 [IU] | Freq: Once | 0 refills | Status: CP
Start: 2017-08-31 — End: ?

## 2017-09-04 ENCOUNTER — Encounter: Admit: 2017-09-04 | Discharge: 2017-09-04 | Payer: MEDICARE

## 2017-09-09 ENCOUNTER — Encounter: Admit: 2017-09-09 | Discharge: 2017-09-09 | Payer: MEDICARE

## 2017-09-09 DIAGNOSIS — C7951 Secondary malignant neoplasm of bone: ICD-10-CM

## 2017-09-09 DIAGNOSIS — C61 Malignant neoplasm of prostate: Principal | ICD-10-CM

## 2017-09-09 MED ORDER — RP DX F-18 SODIUM FLOURIDE MCI
10 | Freq: Once | INTRAVENOUS | 0 refills | Status: CP
Start: 2017-09-09 — End: ?
  Administered 2017-09-09: 16:00:00 9.6 via INTRAVENOUS

## 2017-09-10 ENCOUNTER — Encounter: Admit: 2017-09-10 | Discharge: 2017-09-10 | Payer: MEDICARE

## 2017-09-10 DIAGNOSIS — C778 Secondary and unspecified malignant neoplasm of lymph nodes of multiple regions: ICD-10-CM

## 2017-09-10 DIAGNOSIS — C61 Malignant neoplasm of prostate: Principal | ICD-10-CM

## 2017-09-10 DIAGNOSIS — C7951 Secondary malignant neoplasm of bone: ICD-10-CM

## 2017-09-10 MED ORDER — PREDNISONE 5 MG PO TAB
5 mg | ORAL_TABLET | Freq: Two times a day (BID) | ORAL | 3 refills | Status: AC
Start: 2017-09-10 — End: 2018-01-27
  Filled 2017-09-22 (×2): qty 60, 30d supply, fill #2

## 2017-09-10 MED ORDER — ABIRATERONE 500 MG PO TAB
1000 mg | ORAL_TABLET | Freq: Every day | ORAL | 3 refills | Status: AC
Start: 2017-09-10 — End: 2018-01-06

## 2017-09-17 ENCOUNTER — Encounter: Admit: 2017-09-17 | Discharge: 2017-09-17 | Payer: MEDICARE

## 2017-09-17 DIAGNOSIS — C61 Malignant neoplasm of prostate: Principal | ICD-10-CM

## 2017-09-17 DIAGNOSIS — C778 Secondary and unspecified malignant neoplasm of lymph nodes of multiple regions: ICD-10-CM

## 2017-09-17 DIAGNOSIS — C7951 Secondary malignant neoplasm of bone: ICD-10-CM

## 2017-09-18 ENCOUNTER — Encounter: Admit: 2017-09-18 | Discharge: 2017-09-18 | Payer: MEDICARE

## 2017-09-21 ENCOUNTER — Encounter: Admit: 2017-09-21 | Discharge: 2017-09-21 | Payer: MEDICARE

## 2017-09-22 ENCOUNTER — Encounter: Admit: 2017-09-22 | Discharge: 2017-09-22 | Payer: MEDICARE

## 2017-09-22 DIAGNOSIS — R110 Nausea: ICD-10-CM

## 2017-09-22 DIAGNOSIS — C775 Secondary and unspecified malignant neoplasm of intrapelvic lymph nodes: ICD-10-CM

## 2017-09-22 DIAGNOSIS — R600 Localized edema: ICD-10-CM

## 2017-09-22 DIAGNOSIS — E119 Type 2 diabetes mellitus without complications: ICD-10-CM

## 2017-09-22 DIAGNOSIS — C7951 Secondary malignant neoplasm of bone: ICD-10-CM

## 2017-09-22 DIAGNOSIS — Z87891 Personal history of nicotine dependence: ICD-10-CM

## 2017-09-22 DIAGNOSIS — E039 Hypothyroidism, unspecified: ICD-10-CM

## 2017-09-22 DIAGNOSIS — R112 Nausea with vomiting, unspecified: ICD-10-CM

## 2017-09-22 DIAGNOSIS — E1122 Type 2 diabetes mellitus with diabetic chronic kidney disease: ICD-10-CM

## 2017-09-22 DIAGNOSIS — Z006 Encounter for examination for normal comparison and control in clinical research program: ICD-10-CM

## 2017-09-22 DIAGNOSIS — E785 Hyperlipidemia, unspecified: ICD-10-CM

## 2017-09-22 DIAGNOSIS — Z79818 Long term (current) use of other agents affecting estrogen receptors and estrogen levels: ICD-10-CM

## 2017-09-22 DIAGNOSIS — D6481 Anemia due to antineoplastic chemotherapy: ICD-10-CM

## 2017-09-22 DIAGNOSIS — I2510 Atherosclerotic heart disease of native coronary artery without angina pectoris: Principal | ICD-10-CM

## 2017-09-22 DIAGNOSIS — Z794 Long term (current) use of insulin: ICD-10-CM

## 2017-09-22 DIAGNOSIS — C778 Secondary and unspecified malignant neoplasm of lymph nodes of multiple regions: ICD-10-CM

## 2017-09-22 DIAGNOSIS — T451X5A Adverse effect of antineoplastic and immunosuppressive drugs, initial encounter: ICD-10-CM

## 2017-09-22 DIAGNOSIS — M1990 Unspecified osteoarthritis, unspecified site: ICD-10-CM

## 2017-09-22 DIAGNOSIS — I10 Essential (primary) hypertension: ICD-10-CM

## 2017-09-22 DIAGNOSIS — G4733 Obstructive sleep apnea (adult) (pediatric): ICD-10-CM

## 2017-09-22 DIAGNOSIS — I214 Non-ST elevation (NSTEMI) myocardial infarction: ICD-10-CM

## 2017-09-22 DIAGNOSIS — C61 Malignant neoplasm of prostate: Principal | ICD-10-CM

## 2017-09-22 DIAGNOSIS — A4902 Methicillin resistant Staphylococcus aureus infection, unspecified site: ICD-10-CM

## 2017-09-22 DIAGNOSIS — B0229 Other postherpetic nervous system involvement: ICD-10-CM

## 2017-09-22 DIAGNOSIS — E669 Obesity, unspecified: ICD-10-CM

## 2017-09-22 DIAGNOSIS — N189 Chronic kidney disease, unspecified: ICD-10-CM

## 2017-09-22 DIAGNOSIS — K219 Gastro-esophageal reflux disease without esophagitis: ICD-10-CM

## 2017-09-22 DIAGNOSIS — J449 Chronic obstructive pulmonary disease, unspecified: ICD-10-CM

## 2017-09-22 DIAGNOSIS — D649 Anemia, unspecified: ICD-10-CM

## 2017-09-22 DIAGNOSIS — I509 Heart failure, unspecified: ICD-10-CM

## 2017-09-22 DIAGNOSIS — J45909 Unspecified asthma, uncomplicated: ICD-10-CM

## 2017-09-22 DIAGNOSIS — R0600 Dyspnea, unspecified: ICD-10-CM

## 2017-09-22 LAB — TESTOSTERONE,TOTAL: Lab: 66 ng/dL — ABNORMAL LOW (ref 270–1070)

## 2017-09-22 LAB — COMPREHENSIVE METABOLIC PANEL
Lab: 0.6 mg/dL — ABNORMAL LOW (ref 0.3–1.2)
Lab: 0.7 mg/dL (ref 0.4–1.24)
Lab: 100 MMOL/L (ref 98–110)
Lab: 11 U/L — ABNORMAL LOW (ref 7–56)
Lab: 137 MMOL/L — ABNORMAL LOW (ref 137–147)
Lab: 14 U/L (ref 7–40)
Lab: 170 mg/dL — ABNORMAL HIGH (ref 70–100)
Lab: 21 mg/dL — ABNORMAL HIGH (ref 7–25)
Lab: 29 MMOL/L (ref 21–30)
Lab: 3.4 g/dL — ABNORMAL LOW (ref 3.5–5.0)
Lab: 3.9 MMOL/L (ref 3.5–5.1)
Lab: 6.2 g/dL — ABNORMAL HIGH (ref 6.0–8.0)
Lab: 8 10*3/uL (ref 3–12)
Lab: 87 U/L (ref 25–110)
Lab: 9 mg/dL (ref 8.5–10.6)
Lab: >60 mL/min (ref >60)
Lab: >60 mL/min (ref >60)

## 2017-09-22 LAB — PROSTATIC SPECIFIC ANTIGEN-PSA: Lab: 0.3 ng/mL — ABNORMAL LOW (ref <4.01)

## 2017-09-22 LAB — CBC AND DIFF: Lab: 5.1 10*3/uL (ref 4.5–11.0)

## 2017-09-22 MED ORDER — HEPARIN, PORCINE (PF) 100 UNIT/ML IV SYRG
500 [IU] | Freq: Once | 0 refills | Status: CP
Start: 2017-09-22 — End: ?

## 2017-09-22 MED ORDER — (INV) CABAZITAXEL (HSC 142311) IVPB (NON-PVC)
25 mg/m2 | Freq: Once | INTRAVENOUS | 0 refills | Status: CP
Start: 2017-09-22 — End: ?
  Administered 2017-09-22: 17:00:00 56 mg via INTRAVENOUS

## 2017-09-22 MED ORDER — FAMOTIDINE (PF) 20 MG/2 ML IV SOLN
20 mg | Freq: Once | INTRAVENOUS | 0 refills | Status: CP
Start: 2017-09-22 — End: ?
  Administered 2017-09-22: 16:00:00 20 mg via INTRAVENOUS

## 2017-09-22 MED ORDER — LEUPROLIDE (3 MONTH) 22.5 MG IM SYKT
22.5 mg | Freq: Once | INTRAMUSCULAR | 0 refills | Status: CP
Start: 2017-09-22 — End: ?
  Administered 2017-09-22: 18:00:00 22.5 mg via INTRAMUSCULAR

## 2017-09-22 MED ORDER — DIPHENHYDRAMINE HCL 50 MG/ML IJ SOLN
25 mg | Freq: Once | INTRAVENOUS | 0 refills | Status: CP
Start: 2017-09-22 — End: ?
  Administered 2017-09-22: 16:00:00 25 mg via INTRAVENOUS

## 2017-09-22 MED ORDER — PALONOSETRON 0.25 MG/5 ML IV SOLN
0.25 mg | Freq: Once | INTRAVENOUS | 0 refills | Status: CP
Start: 2017-09-22 — End: ?
  Administered 2017-09-22: 16:00:00 0.25 mg via INTRAVENOUS

## 2017-09-22 MED ORDER — DEXAMETHASONE SODIUM PHOSPHATE 10 MG/ML IJ SOLN
8 mg | Freq: Once | INTRAVENOUS | 0 refills | Status: CP
Start: 2017-09-22 — End: ?
  Administered 2017-09-22: 16:00:00 8 mg via INTRAVENOUS

## 2017-10-06 ENCOUNTER — Encounter: Admit: 2017-10-06 | Discharge: 2017-10-06 | Payer: MEDICARE

## 2017-10-07 ENCOUNTER — Encounter: Admit: 2017-10-07 | Discharge: 2017-10-07 | Payer: MEDICARE

## 2017-10-08 ENCOUNTER — Encounter: Admit: 2017-10-08 | Discharge: 2017-10-08 | Payer: MEDICARE

## 2017-10-08 DIAGNOSIS — C61 Malignant neoplasm of prostate: Principal | ICD-10-CM

## 2017-10-08 DIAGNOSIS — C7951 Secondary malignant neoplasm of bone: ICD-10-CM

## 2017-10-08 DIAGNOSIS — C778 Secondary and unspecified malignant neoplasm of lymph nodes of multiple regions: ICD-10-CM

## 2017-10-13 ENCOUNTER — Encounter: Admit: 2017-10-13 | Discharge: 2017-10-13 | Payer: MEDICARE

## 2017-10-13 DIAGNOSIS — B0229 Other postherpetic nervous system involvement: ICD-10-CM

## 2017-10-13 DIAGNOSIS — C7951 Secondary malignant neoplasm of bone: ICD-10-CM

## 2017-10-13 DIAGNOSIS — T451X5A Adverse effect of antineoplastic and immunosuppressive drugs, initial encounter: ICD-10-CM

## 2017-10-13 DIAGNOSIS — I824Y2 Acute embolism and thrombosis of unspecified deep veins of left proximal lower extremity: ICD-10-CM

## 2017-10-13 DIAGNOSIS — D649 Anemia, unspecified: ICD-10-CM

## 2017-10-13 DIAGNOSIS — Z5111 Encounter for antineoplastic chemotherapy: ICD-10-CM

## 2017-10-13 DIAGNOSIS — E039 Hypothyroidism, unspecified: ICD-10-CM

## 2017-10-13 DIAGNOSIS — R112 Nausea with vomiting, unspecified: ICD-10-CM

## 2017-10-13 DIAGNOSIS — I10 Essential (primary) hypertension: ICD-10-CM

## 2017-10-13 DIAGNOSIS — D759 Disease of blood and blood-forming organs, unspecified: ICD-10-CM

## 2017-10-13 DIAGNOSIS — J449 Chronic obstructive pulmonary disease, unspecified: ICD-10-CM

## 2017-10-13 DIAGNOSIS — R110 Nausea: ICD-10-CM

## 2017-10-13 DIAGNOSIS — G4733 Obstructive sleep apnea (adult) (pediatric): ICD-10-CM

## 2017-10-13 DIAGNOSIS — E669 Obesity, unspecified: ICD-10-CM

## 2017-10-13 DIAGNOSIS — C778 Secondary and unspecified malignant neoplasm of lymph nodes of multiple regions: ICD-10-CM

## 2017-10-13 DIAGNOSIS — D6481 Anemia due to antineoplastic chemotherapy: ICD-10-CM

## 2017-10-13 DIAGNOSIS — I2510 Atherosclerotic heart disease of native coronary artery without angina pectoris: Principal | ICD-10-CM

## 2017-10-13 DIAGNOSIS — J45909 Unspecified asthma, uncomplicated: ICD-10-CM

## 2017-10-13 DIAGNOSIS — I509 Heart failure, unspecified: ICD-10-CM

## 2017-10-13 DIAGNOSIS — R0600 Dyspnea, unspecified: ICD-10-CM

## 2017-10-13 DIAGNOSIS — C61 Malignant neoplasm of prostate: Principal | ICD-10-CM

## 2017-10-13 DIAGNOSIS — Z006 Encounter for examination for normal comparison and control in clinical research program: ICD-10-CM

## 2017-10-13 DIAGNOSIS — E785 Hyperlipidemia, unspecified: ICD-10-CM

## 2017-10-13 DIAGNOSIS — E876 Hypokalemia: ICD-10-CM

## 2017-10-13 DIAGNOSIS — N189 Chronic kidney disease, unspecified: ICD-10-CM

## 2017-10-13 DIAGNOSIS — I214 Non-ST elevation (NSTEMI) myocardial infarction: ICD-10-CM

## 2017-10-13 DIAGNOSIS — E119 Type 2 diabetes mellitus without complications: ICD-10-CM

## 2017-10-13 DIAGNOSIS — A4902 Methicillin resistant Staphylococcus aureus infection, unspecified site: ICD-10-CM

## 2017-10-13 DIAGNOSIS — K219 Gastro-esophageal reflux disease without esophagitis: ICD-10-CM

## 2017-10-13 DIAGNOSIS — Z87891 Personal history of nicotine dependence: ICD-10-CM

## 2017-10-13 DIAGNOSIS — M1990 Unspecified osteoarthritis, unspecified site: ICD-10-CM

## 2017-10-13 LAB — COMPREHENSIVE METABOLIC PANEL
Lab: 0.8 mg/dL — ABNORMAL HIGH (ref >60)
Lab: 0.8 mg/dL — ABNORMAL LOW (ref 0.3–1.2)
Lab: 10 U/L — ABNORMAL LOW (ref 7–56)
Lab: 137 MMOL/L — ABNORMAL LOW (ref 137–147)
Lab: 14 U/L (ref 7–40)
Lab: 18 mg/dL — ABNORMAL HIGH (ref >60)
Lab: 201 mg/dL — ABNORMAL HIGH (ref 70–100)
Lab: 3.1 MMOL/L — ABNORMAL LOW (ref 3.5–5.1)
Lab: 3.1 g/dL — ABNORMAL LOW (ref >60)
Lab: 33 MMOL/L — ABNORMAL HIGH (ref 21–30)
Lab: 5.7 g/dL — ABNORMAL LOW (ref 6.0–8.0)
Lab: 70 U/L (ref >60)
Lab: 8 K/UL (ref 3–12)
Lab: 8.3 mg/dL — ABNORMAL LOW (ref 8.5–10.6)
Lab: 96 MMOL/L — ABNORMAL LOW (ref 98–110)
Lab: >60 mL/min (ref >60)
Lab: >60 mL/min (ref >60)

## 2017-10-13 LAB — PROSTATIC SPECIFIC ANTIGEN-PSA: Lab: 0.5 ng/mL — ABNORMAL LOW (ref <4.01)

## 2017-10-13 LAB — TESTOSTERONE,TOTAL: Lab: 56 ng/dL — ABNORMAL LOW (ref 270–1070)

## 2017-10-13 LAB — CBC AND DIFF: Lab: 5.1 K/UL — ABNORMAL HIGH (ref 4.5–11.0)

## 2017-10-13 MED ORDER — DEXAMETHASONE SODIUM PHOSPHATE 10 MG/ML IJ SOLN
8 mg | Freq: Once | INTRAVENOUS | 0 refills | Status: CP
Start: 2017-10-13 — End: ?
  Administered 2017-10-13: 16:00:00 8 mg via INTRAVENOUS

## 2017-10-13 MED ORDER — DIPHENHYDRAMINE HCL 50 MG/ML IJ SOLN
25 mg | Freq: Once | INTRAVENOUS | 0 refills | Status: CP
Start: 2017-10-13 — End: ?
  Administered 2017-10-13: 16:00:00 25 mg via INTRAVENOUS

## 2017-10-13 MED ORDER — (INV) CABAZITAXEL (HSC 142311) IVPB (NON-PVC)
25 mg/m2 | Freq: Once | INTRAVENOUS | 0 refills | Status: CP
Start: 2017-10-13 — End: ?
  Administered 2017-10-13: 17:00:00 56 mg via INTRAVENOUS

## 2017-10-13 MED ORDER — LEUPROLIDE (3 MONTH) 22.5 MG IM SYKT
22.5 mg | Freq: Once | INTRAMUSCULAR | 0 refills | Status: CN
Start: 2017-10-13 — End: ?

## 2017-10-13 MED ORDER — PALONOSETRON 0.25 MG/5 ML IV SOLN
0.25 mg | Freq: Once | INTRAVENOUS | 0 refills | Status: CP
Start: 2017-10-13 — End: ?
  Administered 2017-10-13: 16:00:00 0.25 mg via INTRAVENOUS

## 2017-10-13 MED ORDER — HEPARIN, PORCINE (PF) 100 UNIT/ML IV SYRG
500 [IU] | Freq: Once | 0 refills | Status: CP
Start: 2017-10-13 — End: ?

## 2017-10-13 MED ORDER — RIVAROXABAN 20 MG PO TAB
20 mg | ORAL_TABLET | Freq: Every day | ORAL | 11 refills | 30.00000 days | Status: AC
Start: 2017-10-13 — End: 2018-06-09

## 2017-10-13 MED ORDER — FAMOTIDINE (PF) 20 MG/2 ML IV SOLN
20 mg | Freq: Once | INTRAVENOUS | 0 refills | Status: CP
Start: 2017-10-13 — End: ?
  Administered 2017-10-13: 16:00:00 20 mg via INTRAVENOUS

## 2017-10-13 MED ORDER — ONDANSETRON HCL 8 MG PO TAB
8 mg | ORAL_TABLET | ORAL | 3 refills | 8.00000 days | Status: AC | PRN
Start: 2017-10-13 — End: 2018-03-09

## 2017-10-13 MED ORDER — POTASSIUM CHLORIDE 20 MEQ PO TBTQ
20 meq | ORAL_TABLET | Freq: Every day | ORAL | 0 refills | 30.00000 days | Status: AC
Start: 2017-10-13 — End: 2018-07-22

## 2017-10-14 ENCOUNTER — Encounter: Admit: 2017-10-14 | Discharge: 2017-10-14 | Payer: MEDICARE

## 2017-10-14 ENCOUNTER — Ambulatory Visit: Admit: 2017-10-14 | Discharge: 2017-10-14 | Payer: MEDICARE

## 2017-10-14 DIAGNOSIS — R600 Localized edema: ICD-10-CM

## 2017-10-14 DIAGNOSIS — C7951 Secondary malignant neoplasm of bone: ICD-10-CM

## 2017-10-14 DIAGNOSIS — C61 Malignant neoplasm of prostate: Principal | ICD-10-CM

## 2017-10-14 DIAGNOSIS — R29898 Other symptoms and signs involving the musculoskeletal system: Secondary | ICD-10-CM

## 2017-10-14 DIAGNOSIS — I10 Essential (primary) hypertension: Principal | ICD-10-CM

## 2017-10-14 MED ORDER — PERFLUTREN LIPID MICROSPHERES 1.1 MG/ML IV SUSP
1-20 mL | Freq: Once | INTRAVENOUS | 0 refills | Status: CP | PRN
Start: 2017-10-14 — End: ?
  Administered 2017-10-14: 17:00:00 2 mL via INTRAVENOUS

## 2017-10-17 ENCOUNTER — Encounter: Admit: 2017-10-17 | Discharge: 2017-10-17 | Payer: MEDICARE

## 2017-10-21 ENCOUNTER — Encounter: Admit: 2017-10-21 | Discharge: 2017-10-21 | Payer: MEDICARE

## 2017-10-21 MED FILL — PREDNISONE 5 MG PO TAB: 5 mg | ORAL | 30 days supply | Qty: 60 | Fill #3 | Status: AC

## 2017-10-22 ENCOUNTER — Encounter: Admit: 2017-10-22 | Discharge: 2017-10-22 | Payer: MEDICARE

## 2017-10-27 ENCOUNTER — Encounter: Admit: 2017-10-27 | Discharge: 2017-10-27 | Payer: MEDICARE

## 2017-10-27 ENCOUNTER — Ambulatory Visit: Admit: 2017-10-27 | Discharge: 2017-10-28 | Payer: MEDICARE

## 2017-10-27 DIAGNOSIS — Z09 Encounter for follow-up examination after completed treatment for conditions other than malignant neoplasm: Principal | ICD-10-CM

## 2017-10-28 ENCOUNTER — Encounter: Admit: 2017-10-28 | Discharge: 2017-10-28 | Payer: MEDICARE

## 2017-10-28 DIAGNOSIS — C61 Malignant neoplasm of prostate: Principal | ICD-10-CM

## 2017-10-28 DIAGNOSIS — C7951 Secondary malignant neoplasm of bone: ICD-10-CM

## 2017-10-29 ENCOUNTER — Encounter: Admit: 2017-10-29 | Discharge: 2017-10-29 | Payer: MEDICARE

## 2017-11-03 ENCOUNTER — Ambulatory Visit: Admit: 2017-11-03 | Discharge: 2017-11-03 | Payer: MEDICARE

## 2017-11-03 ENCOUNTER — Encounter: Admit: 2017-11-03 | Discharge: 2017-11-03 | Payer: MEDICARE

## 2017-11-03 ENCOUNTER — Encounter: Admit: 2017-11-03 | Discharge: 2017-11-04 | Payer: MEDICARE

## 2017-11-03 DIAGNOSIS — C778 Secondary and unspecified malignant neoplasm of lymph nodes of multiple regions: ICD-10-CM

## 2017-11-03 DIAGNOSIS — E119 Type 2 diabetes mellitus without complications: ICD-10-CM

## 2017-11-03 DIAGNOSIS — D6481 Anemia due to antineoplastic chemotherapy: ICD-10-CM

## 2017-11-03 DIAGNOSIS — Z8619 Personal history of other infectious and parasitic diseases: ICD-10-CM

## 2017-11-03 DIAGNOSIS — Z006 Encounter for examination for normal comparison and control in clinical research program: ICD-10-CM

## 2017-11-03 DIAGNOSIS — C7951 Secondary malignant neoplasm of bone: ICD-10-CM

## 2017-11-03 DIAGNOSIS — C61 Malignant neoplasm of prostate: Principal | ICD-10-CM

## 2017-11-03 DIAGNOSIS — E785 Hyperlipidemia, unspecified: ICD-10-CM

## 2017-11-03 DIAGNOSIS — I509 Heart failure, unspecified: ICD-10-CM

## 2017-11-03 DIAGNOSIS — G4733 Obstructive sleep apnea (adult) (pediatric): ICD-10-CM

## 2017-11-03 DIAGNOSIS — T451X5A Adverse effect of antineoplastic and immunosuppressive drugs, initial encounter: ICD-10-CM

## 2017-11-03 DIAGNOSIS — B0229 Other postherpetic nervous system involvement: ICD-10-CM

## 2017-11-03 DIAGNOSIS — K219 Gastro-esophageal reflux disease without esophagitis: ICD-10-CM

## 2017-11-03 DIAGNOSIS — I2510 Atherosclerotic heart disease of native coronary artery without angina pectoris: Principal | ICD-10-CM

## 2017-11-03 DIAGNOSIS — I214 Non-ST elevation (NSTEMI) myocardial infarction: ICD-10-CM

## 2017-11-03 DIAGNOSIS — I10 Essential (primary) hypertension: ICD-10-CM

## 2017-11-03 DIAGNOSIS — J45909 Unspecified asthma, uncomplicated: ICD-10-CM

## 2017-11-03 DIAGNOSIS — A4902 Methicillin resistant Staphylococcus aureus infection, unspecified site: ICD-10-CM

## 2017-11-03 DIAGNOSIS — N189 Chronic kidney disease, unspecified: ICD-10-CM

## 2017-11-03 DIAGNOSIS — Z87891 Personal history of nicotine dependence: ICD-10-CM

## 2017-11-03 DIAGNOSIS — R0600 Dyspnea, unspecified: ICD-10-CM

## 2017-11-03 DIAGNOSIS — E669 Obesity, unspecified: ICD-10-CM

## 2017-11-03 DIAGNOSIS — E039 Hypothyroidism, unspecified: ICD-10-CM

## 2017-11-03 DIAGNOSIS — D649 Anemia, unspecified: ICD-10-CM

## 2017-11-03 DIAGNOSIS — M1990 Unspecified osteoarthritis, unspecified site: ICD-10-CM

## 2017-11-03 DIAGNOSIS — J449 Chronic obstructive pulmonary disease, unspecified: ICD-10-CM

## 2017-11-03 LAB — PROSTATIC SPECIFIC ANTIGEN-PSA: Lab: 0.4 ng/mL (ref <4.01)

## 2017-11-03 LAB — CBC AND DIFF
Lab: 0 % (ref >60)
Lab: 0 10*3/uL (ref 0–0.45)
Lab: 0.1 10*3/uL (ref 0–0.20)
Lab: 0.5 10*3/uL (ref 0–0.80)
Lab: 0.8 10*3/uL — ABNORMAL LOW (ref 1.0–4.8)
Lab: 1 % (ref >60)
Lab: 13 % — ABNORMAL LOW (ref 24–44)
Lab: 17 % — ABNORMAL HIGH (ref 11–15)
Lab: 187 10*3/uL — ABNORMAL HIGH (ref 150–400)
Lab: 3.6 M/UL — ABNORMAL LOW (ref 4.4–5.5)
Lab: 32 % — ABNORMAL LOW (ref 40–50)
Lab: 32 g/dL (ref 32.0–36.0)
Lab: 5 10*3/uL (ref 1.8–7.0)
Lab: 6.3 10*3/uL — ABNORMAL LOW (ref 4.5–11.0)
Lab: 78 % — ABNORMAL HIGH (ref 41–77)
Lab: 8 % (ref 4–12)
Lab: 8.7 FL — ABNORMAL HIGH (ref 7–11)
Lab: 89 FL (ref 80–100)

## 2017-11-03 LAB — COMPREHENSIVE METABOLIC PANEL
Lab: 134 MMOL/L — ABNORMAL LOW (ref 137–147)
Lab: 4.2 MMOL/L (ref 3.5–5.1)

## 2017-11-03 LAB — TESTOSTERONE,TOTAL: Lab: 57 ng/dL — ABNORMAL LOW (ref 270–1070)

## 2017-11-03 LAB — MAGNESIUM: Lab: 1.7 mg/dL (ref 1.6–2.6)

## 2017-11-03 LAB — PHOSPHORUS: Lab: 2.8 mg/dL (ref 2.0–4.5)

## 2017-11-03 MED ORDER — SODIUM CHLORIDE 0.9 % IJ SOLN
50 mL | Freq: Once | INTRAVENOUS | 0 refills | Status: CP
Start: 2017-11-03 — End: ?

## 2017-11-03 MED ORDER — IOHEXOL 350 MG IODINE/ML IV SOLN
100 mL | Freq: Once | INTRAVENOUS | 0 refills | Status: CP
Start: 2017-11-03 — End: ?
  Administered 2017-11-03: 13:00:00 100 mL via INTRAVENOUS

## 2017-11-03 MED ORDER — RP DX TC-99M MEDRONATE MCI
25 | Freq: Once | INTRAVENOUS | 0 refills | Status: CP
Start: 2017-11-03 — End: ?
  Administered 2017-11-03: 13:00:00 27.5 via INTRAVENOUS

## 2017-11-26 ENCOUNTER — Encounter: Admit: 2017-11-26 | Discharge: 2017-11-26 | Payer: MEDICARE

## 2017-11-26 MED FILL — PREDNISONE 5 MG PO TAB: 5 mg | ORAL | 30 days supply | Qty: 60 | Fill #4 | Status: AC

## 2017-12-04 ENCOUNTER — Encounter: Admit: 2017-12-04 | Discharge: 2017-12-04 | Payer: MEDICARE

## 2017-12-04 ENCOUNTER — Ambulatory Visit: Admit: 2017-12-04 | Discharge: 2017-12-05 | Payer: MEDICARE

## 2017-12-04 DIAGNOSIS — I2510 Atherosclerotic heart disease of native coronary artery without angina pectoris: Principal | ICD-10-CM

## 2017-12-04 DIAGNOSIS — R0600 Dyspnea, unspecified: ICD-10-CM

## 2017-12-04 DIAGNOSIS — I10 Essential (primary) hypertension: ICD-10-CM

## 2017-12-04 DIAGNOSIS — A4902 Methicillin resistant Staphylococcus aureus infection, unspecified site: ICD-10-CM

## 2017-12-04 DIAGNOSIS — I214 Non-ST elevation (NSTEMI) myocardial infarction: ICD-10-CM

## 2017-12-04 DIAGNOSIS — K219 Gastro-esophageal reflux disease without esophagitis: ICD-10-CM

## 2017-12-04 DIAGNOSIS — J45909 Unspecified asthma, uncomplicated: ICD-10-CM

## 2017-12-04 DIAGNOSIS — I509 Heart failure, unspecified: ICD-10-CM

## 2017-12-04 DIAGNOSIS — G4733 Obstructive sleep apnea (adult) (pediatric): ICD-10-CM

## 2017-12-04 DIAGNOSIS — E669 Obesity, unspecified: ICD-10-CM

## 2017-12-04 DIAGNOSIS — E039 Hypothyroidism, unspecified: ICD-10-CM

## 2017-12-04 DIAGNOSIS — M1990 Unspecified osteoarthritis, unspecified site: ICD-10-CM

## 2017-12-04 DIAGNOSIS — Z87891 Personal history of nicotine dependence: ICD-10-CM

## 2017-12-04 DIAGNOSIS — D649 Anemia, unspecified: ICD-10-CM

## 2017-12-04 DIAGNOSIS — I5022 Chronic systolic (congestive) heart failure: ICD-10-CM

## 2017-12-04 DIAGNOSIS — E785 Hyperlipidemia, unspecified: ICD-10-CM

## 2017-12-04 DIAGNOSIS — E119 Type 2 diabetes mellitus without complications: ICD-10-CM

## 2017-12-04 DIAGNOSIS — B0229 Other postherpetic nervous system involvement: ICD-10-CM

## 2017-12-04 DIAGNOSIS — N189 Chronic kidney disease, unspecified: ICD-10-CM

## 2017-12-04 DIAGNOSIS — J449 Chronic obstructive pulmonary disease, unspecified: ICD-10-CM

## 2017-12-18 ENCOUNTER — Encounter: Admit: 2017-12-18 | Discharge: 2017-12-18 | Payer: MEDICARE

## 2017-12-18 DIAGNOSIS — D649 Anemia, unspecified: ICD-10-CM

## 2017-12-18 DIAGNOSIS — A4902 Methicillin resistant Staphylococcus aureus infection, unspecified site: ICD-10-CM

## 2017-12-18 DIAGNOSIS — E039 Hypothyroidism, unspecified: ICD-10-CM

## 2017-12-18 DIAGNOSIS — C61 Malignant neoplasm of prostate: Principal | ICD-10-CM

## 2017-12-18 DIAGNOSIS — I214 Non-ST elevation (NSTEMI) myocardial infarction: ICD-10-CM

## 2017-12-18 DIAGNOSIS — E119 Type 2 diabetes mellitus without complications: ICD-10-CM

## 2017-12-18 DIAGNOSIS — Z006 Encounter for examination for normal comparison and control in clinical research program: ICD-10-CM

## 2017-12-18 DIAGNOSIS — M1990 Unspecified osteoarthritis, unspecified site: ICD-10-CM

## 2017-12-18 DIAGNOSIS — N189 Chronic kidney disease, unspecified: ICD-10-CM

## 2017-12-18 DIAGNOSIS — I10 Essential (primary) hypertension: ICD-10-CM

## 2017-12-18 DIAGNOSIS — I509 Heart failure, unspecified: ICD-10-CM

## 2017-12-18 DIAGNOSIS — J449 Chronic obstructive pulmonary disease, unspecified: ICD-10-CM

## 2017-12-18 DIAGNOSIS — E669 Obesity, unspecified: ICD-10-CM

## 2017-12-18 DIAGNOSIS — I2510 Atherosclerotic heart disease of native coronary artery without angina pectoris: Principal | ICD-10-CM

## 2017-12-18 DIAGNOSIS — G4733 Obstructive sleep apnea (adult) (pediatric): ICD-10-CM

## 2017-12-18 DIAGNOSIS — R0600 Dyspnea, unspecified: ICD-10-CM

## 2017-12-18 DIAGNOSIS — J45909 Unspecified asthma, uncomplicated: ICD-10-CM

## 2017-12-18 DIAGNOSIS — E785 Hyperlipidemia, unspecified: ICD-10-CM

## 2017-12-18 DIAGNOSIS — Z87891 Personal history of nicotine dependence: ICD-10-CM

## 2017-12-18 DIAGNOSIS — B0229 Other postherpetic nervous system involvement: ICD-10-CM

## 2017-12-18 DIAGNOSIS — K219 Gastro-esophageal reflux disease without esophagitis: ICD-10-CM

## 2017-12-18 LAB — COMPREHENSIVE METABOLIC PANEL
Lab: 12 U/L — ABNORMAL LOW (ref 7–56)
Lab: 7 10*3/uL (ref 3–12)
Lab: >60 mL/min (ref >60)
Lab: >60 mL/min (ref >60)

## 2017-12-18 MED ORDER — LEUPROLIDE (3 MONTH) 22.5 MG IM SYKT
22.5 mg | Freq: Once | INTRAMUSCULAR | 0 refills | Status: CP
Start: 2017-12-18 — End: ?
  Administered 2017-12-18: 15:00:00 22.5 mg via INTRAMUSCULAR

## 2017-12-26 ENCOUNTER — Encounter: Admit: 2017-12-26 | Discharge: 2017-12-26 | Payer: MEDICARE

## 2017-12-26 MED ORDER — PREDNISONE 5 MG PO TAB
5 mg | ORAL_TABLET | Freq: Two times a day (BID) | ORAL | 3 refills | Status: AC
Start: 2017-12-26 — End: 2018-01-06
  Filled 2017-12-26 (×2): qty 60, 30d supply, fill #1

## 2018-01-06 ENCOUNTER — Encounter: Admit: 2018-01-06 | Discharge: 2018-01-06 | Payer: MEDICARE

## 2018-01-06 DIAGNOSIS — C778 Secondary and unspecified malignant neoplasm of lymph nodes of multiple regions: ICD-10-CM

## 2018-01-06 DIAGNOSIS — C61 Malignant neoplasm of prostate: ICD-10-CM

## 2018-01-06 DIAGNOSIS — R112 Nausea with vomiting, unspecified: Principal | ICD-10-CM

## 2018-01-06 DIAGNOSIS — C7951 Secondary malignant neoplasm of bone: ICD-10-CM

## 2018-01-06 MED ORDER — ABIRATERONE 500 MG PO TAB
1000 mg | ORAL_TABLET | Freq: Every day | ORAL | 3 refills | Status: CN
Start: 2018-01-06 — End: ?

## 2018-01-06 MED ORDER — PREDNISONE 5 MG PO TAB
5 mg | ORAL_TABLET | Freq: Two times a day (BID) | ORAL | 3 refills | Status: AC
Start: 2018-01-06 — End: 2018-01-27

## 2018-01-06 MED ORDER — PREDNISONE 5 MG PO TAB
5 mg | ORAL_TABLET | Freq: Two times a day (BID) | ORAL | 3 refills | Status: CN
Start: 2018-01-06 — End: ?

## 2018-01-06 MED ORDER — ABIRATERONE 500 MG PO TAB
1000 mg | ORAL_TABLET | Freq: Every day | ORAL | 3 refills | Status: AC
Start: 2018-01-06 — End: 2018-06-09

## 2018-01-20 ENCOUNTER — Encounter: Admit: 2018-01-20 | Discharge: 2018-01-20 | Payer: MEDICARE

## 2018-01-20 MED ORDER — CARVEDILOL 3.125 MG PO TAB
3.125 mg | ORAL_TABLET | Freq: Two times a day (BID) | ORAL | 0 refills | 90.00000 days | Status: AC
Start: 2018-01-20 — End: 2018-02-20

## 2018-01-23 ENCOUNTER — Encounter: Admit: 2018-01-23 | Discharge: 2018-01-23 | Payer: MEDICARE

## 2018-01-23 MED ORDER — PREDNISONE 5 MG PO TAB
5 mg | ORAL_TABLET | Freq: Two times a day (BID) | ORAL | 3 refills
Start: 2018-01-23 — End: ?

## 2018-01-26 ENCOUNTER — Encounter: Admit: 2018-01-26 | Discharge: 2018-02-08 | Payer: MEDICARE

## 2018-01-26 ENCOUNTER — Encounter: Admit: 2018-01-26 | Discharge: 2018-01-26 | Payer: MEDICARE

## 2018-01-26 ENCOUNTER — Encounter: Admit: 2018-01-26 | Discharge: 2018-01-27 | Payer: MEDICARE

## 2018-01-26 ENCOUNTER — Ambulatory Visit: Admit: 2018-01-26 | Discharge: 2018-01-26 | Payer: MEDICARE

## 2018-01-26 DIAGNOSIS — I2510 Atherosclerotic heart disease of native coronary artery without angina pectoris: Principal | ICD-10-CM

## 2018-01-26 DIAGNOSIS — E119 Type 2 diabetes mellitus without complications: ICD-10-CM

## 2018-01-26 DIAGNOSIS — G4733 Obstructive sleep apnea (adult) (pediatric): ICD-10-CM

## 2018-01-26 DIAGNOSIS — M1990 Unspecified osteoarthritis, unspecified site: ICD-10-CM

## 2018-01-26 DIAGNOSIS — E785 Hyperlipidemia, unspecified: ICD-10-CM

## 2018-01-26 DIAGNOSIS — J45909 Unspecified asthma, uncomplicated: ICD-10-CM

## 2018-01-26 DIAGNOSIS — E039 Hypothyroidism, unspecified: ICD-10-CM

## 2018-01-26 DIAGNOSIS — Z87891 Personal history of nicotine dependence: ICD-10-CM

## 2018-01-26 DIAGNOSIS — I214 Non-ST elevation (NSTEMI) myocardial infarction: ICD-10-CM

## 2018-01-26 DIAGNOSIS — I10 Essential (primary) hypertension: ICD-10-CM

## 2018-01-26 DIAGNOSIS — A4902 Methicillin resistant Staphylococcus aureus infection, unspecified site: ICD-10-CM

## 2018-01-26 DIAGNOSIS — E669 Obesity, unspecified: ICD-10-CM

## 2018-01-26 DIAGNOSIS — N189 Chronic kidney disease, unspecified: ICD-10-CM

## 2018-01-26 DIAGNOSIS — J449 Chronic obstructive pulmonary disease, unspecified: ICD-10-CM

## 2018-01-26 DIAGNOSIS — G893 Neoplasm related pain (acute) (chronic): Secondary | ICD-10-CM

## 2018-01-26 DIAGNOSIS — I509 Heart failure, unspecified: ICD-10-CM

## 2018-01-26 DIAGNOSIS — B0229 Other postherpetic nervous system involvement: ICD-10-CM

## 2018-01-26 DIAGNOSIS — K219 Gastro-esophageal reflux disease without esophagitis: ICD-10-CM

## 2018-01-26 DIAGNOSIS — C61 Malignant neoplasm of prostate: Secondary | ICD-10-CM

## 2018-01-26 DIAGNOSIS — D649 Anemia, unspecified: ICD-10-CM

## 2018-01-26 DIAGNOSIS — R0600 Dyspnea, unspecified: ICD-10-CM

## 2018-01-26 LAB — CBC AND DIFF
Lab: 3 M/UL — ABNORMAL LOW (ref 4.4–5.5)
Lab: 5.1 10*3/uL (ref 4.5–11.0)

## 2018-01-26 LAB — COMPREHENSIVE METABOLIC PANEL
Lab: 116 mg/dL — ABNORMAL HIGH (ref 70–100)
Lab: 137 MMOL/L — ABNORMAL LOW (ref 137–147)
Lab: 21 mg/dL — ABNORMAL LOW (ref 7–25)
Lab: 3.9 MMOL/L — ABNORMAL LOW (ref 3.5–5.1)

## 2018-01-26 LAB — POC CREATININE, RAD: Lab: 0.8 mg/dL (ref 0.4–1.24)

## 2018-01-26 LAB — PROSTATIC SPECIFIC ANTIGEN-PSA: Lab: 0.8 ng/mL (ref <4.01)

## 2018-01-26 MED ORDER — RP DX TC-99M MEDRONATE MCI
25 | Freq: Once | INTRAVENOUS | 0 refills | Status: CP
Start: 2018-01-26 — End: ?
  Administered 2018-01-26: 15:00:00 27.4 via INTRAVENOUS

## 2018-01-26 MED ORDER — HYDROCODONE-ACETAMINOPHEN 10-325 MG PO TAB
1 | ORAL_TABLET | ORAL | 0 refills | 15.00000 days | Status: AC | PRN
Start: 2018-01-26 — End: 2018-03-09

## 2018-01-26 MED ORDER — SODIUM CHLORIDE 0.9 % IJ SOLN
50 mL | Freq: Once | INTRAVENOUS | 0 refills | Status: CP
Start: 2018-01-26 — End: ?
  Administered 2018-01-26: 16:00:00 50 mL via INTRAVENOUS

## 2018-01-26 MED ORDER — IOHEXOL 350 MG IODINE/ML IV SOLN
100 mL | Freq: Once | INTRAVENOUS | 0 refills | Status: CP
Start: 2018-01-26 — End: ?
  Administered 2018-01-26: 16:00:00 100 mL via INTRAVENOUS

## 2018-01-27 ENCOUNTER — Encounter: Admit: 2018-01-27 | Discharge: 2018-01-27 | Payer: MEDICARE

## 2018-01-27 DIAGNOSIS — C7951 Secondary malignant neoplasm of bone: ICD-10-CM

## 2018-01-27 DIAGNOSIS — C61 Malignant neoplasm of prostate: ICD-10-CM

## 2018-01-27 DIAGNOSIS — R112 Nausea with vomiting, unspecified: Principal | ICD-10-CM

## 2018-01-27 DIAGNOSIS — C778 Secondary and unspecified malignant neoplasm of lymph nodes of multiple regions: ICD-10-CM

## 2018-01-27 MED ORDER — PREDNISONE 5 MG PO TAB
5 mg | ORAL_TABLET | Freq: Two times a day (BID) | ORAL | 3 refills | Status: AC
Start: 2018-01-27 — End: 2018-07-22
  Filled 2018-01-27 (×2): qty 60, 30d supply, fill #1

## 2018-02-08 DIAGNOSIS — G893 Neoplasm related pain (acute) (chronic): ICD-10-CM

## 2018-02-08 DIAGNOSIS — E1122 Type 2 diabetes mellitus with diabetic chronic kidney disease: ICD-10-CM

## 2018-02-08 DIAGNOSIS — I130 Hypertensive heart and chronic kidney disease with heart failure and stage 1 through stage 4 chronic kidney disease, or unspecified chronic kidney disease: ICD-10-CM

## 2018-02-08 DIAGNOSIS — C61 Malignant neoplasm of prostate: Principal | ICD-10-CM

## 2018-02-08 DIAGNOSIS — C778 Secondary and unspecified malignant neoplasm of lymph nodes of multiple regions: ICD-10-CM

## 2018-02-08 DIAGNOSIS — C7951 Secondary malignant neoplasm of bone: ICD-10-CM

## 2018-02-08 DIAGNOSIS — I509 Heart failure, unspecified: ICD-10-CM

## 2018-02-08 DIAGNOSIS — Z87891 Personal history of nicotine dependence: ICD-10-CM

## 2018-02-08 DIAGNOSIS — D649 Anemia, unspecified: ICD-10-CM

## 2018-02-08 DIAGNOSIS — Z006 Encounter for examination for normal comparison and control in clinical research program: ICD-10-CM

## 2018-02-08 DIAGNOSIS — E1140 Type 2 diabetes mellitus with diabetic neuropathy, unspecified: ICD-10-CM

## 2018-02-20 ENCOUNTER — Encounter: Admit: 2018-02-20 | Discharge: 2018-02-20 | Payer: MEDICARE

## 2018-02-20 MED ORDER — CARVEDILOL 3.125 MG PO TAB
3.125 mg | ORAL_TABLET | Freq: Two times a day (BID) | ORAL | 11 refills | 90.00000 days | Status: AC
Start: 2018-02-20 — End: 2019-04-05

## 2018-02-23 IMAGING — CR CHEST
2 series · 2 of 2 positions shown · non-contrast
Comparison: none

[chest pa x-wise]
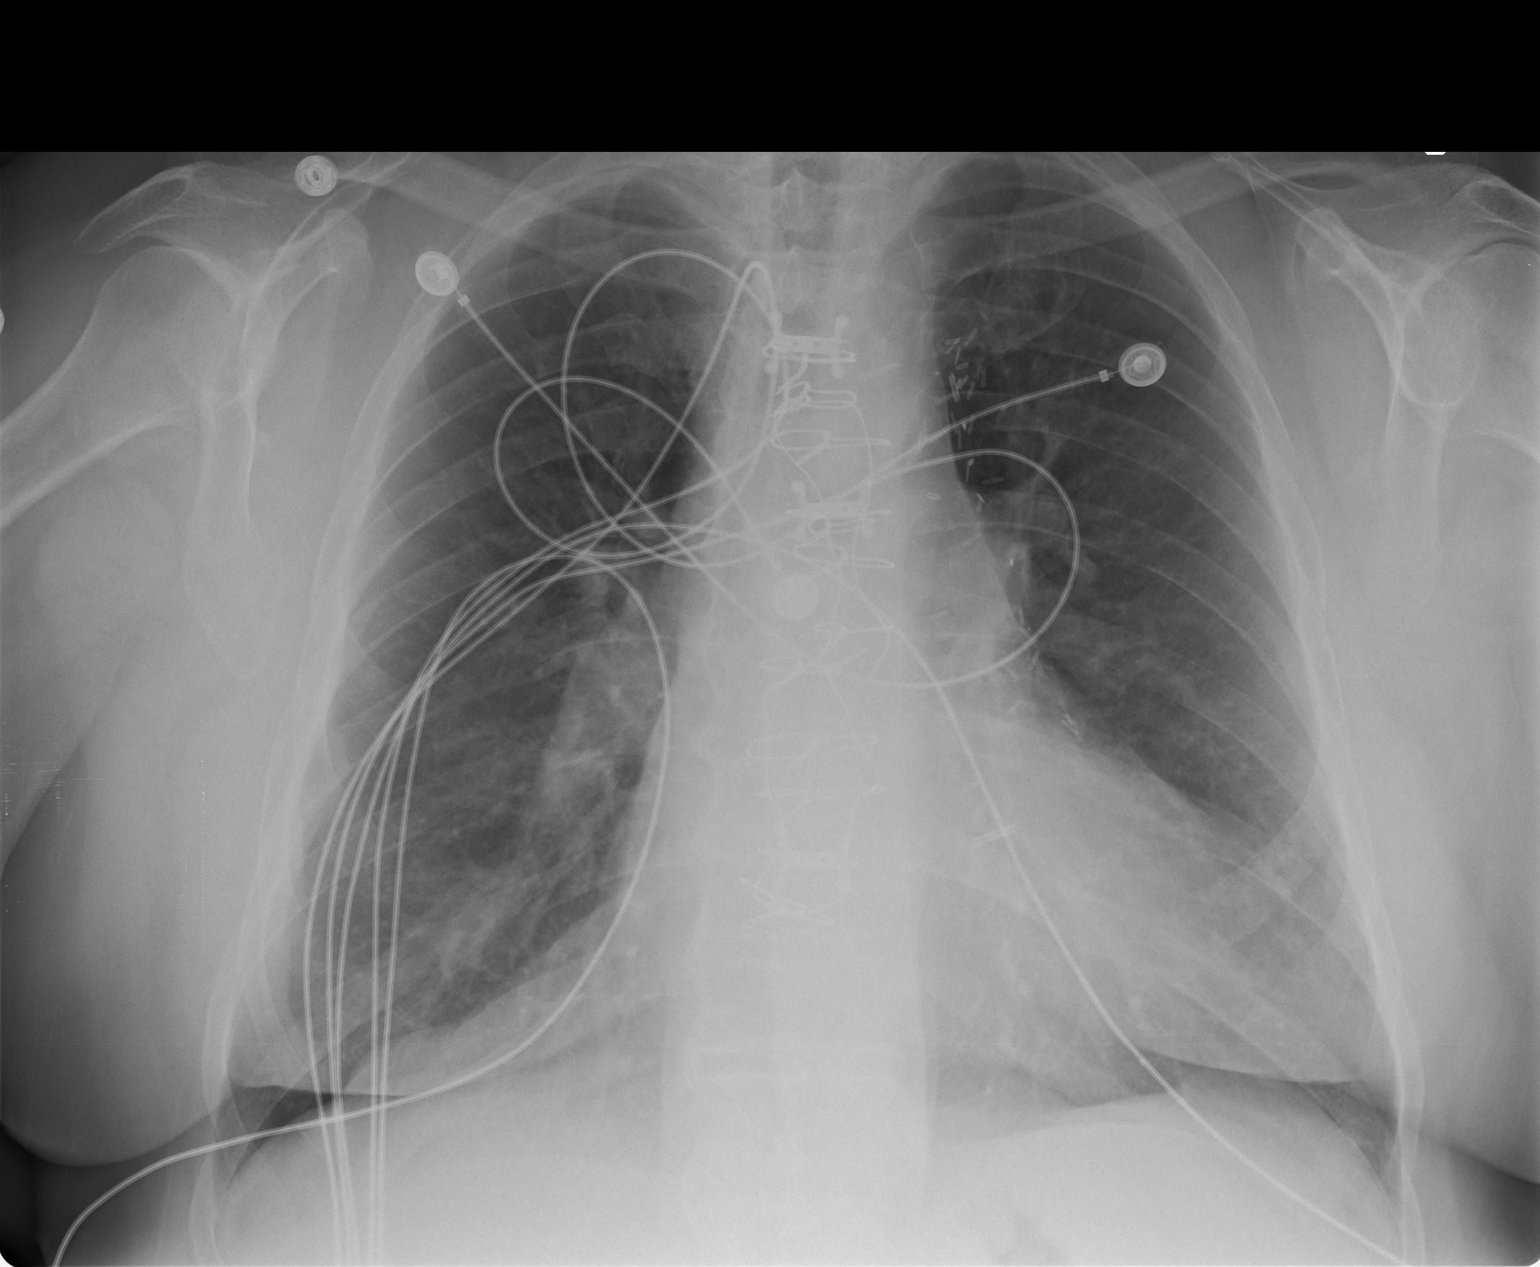

[chest lat]
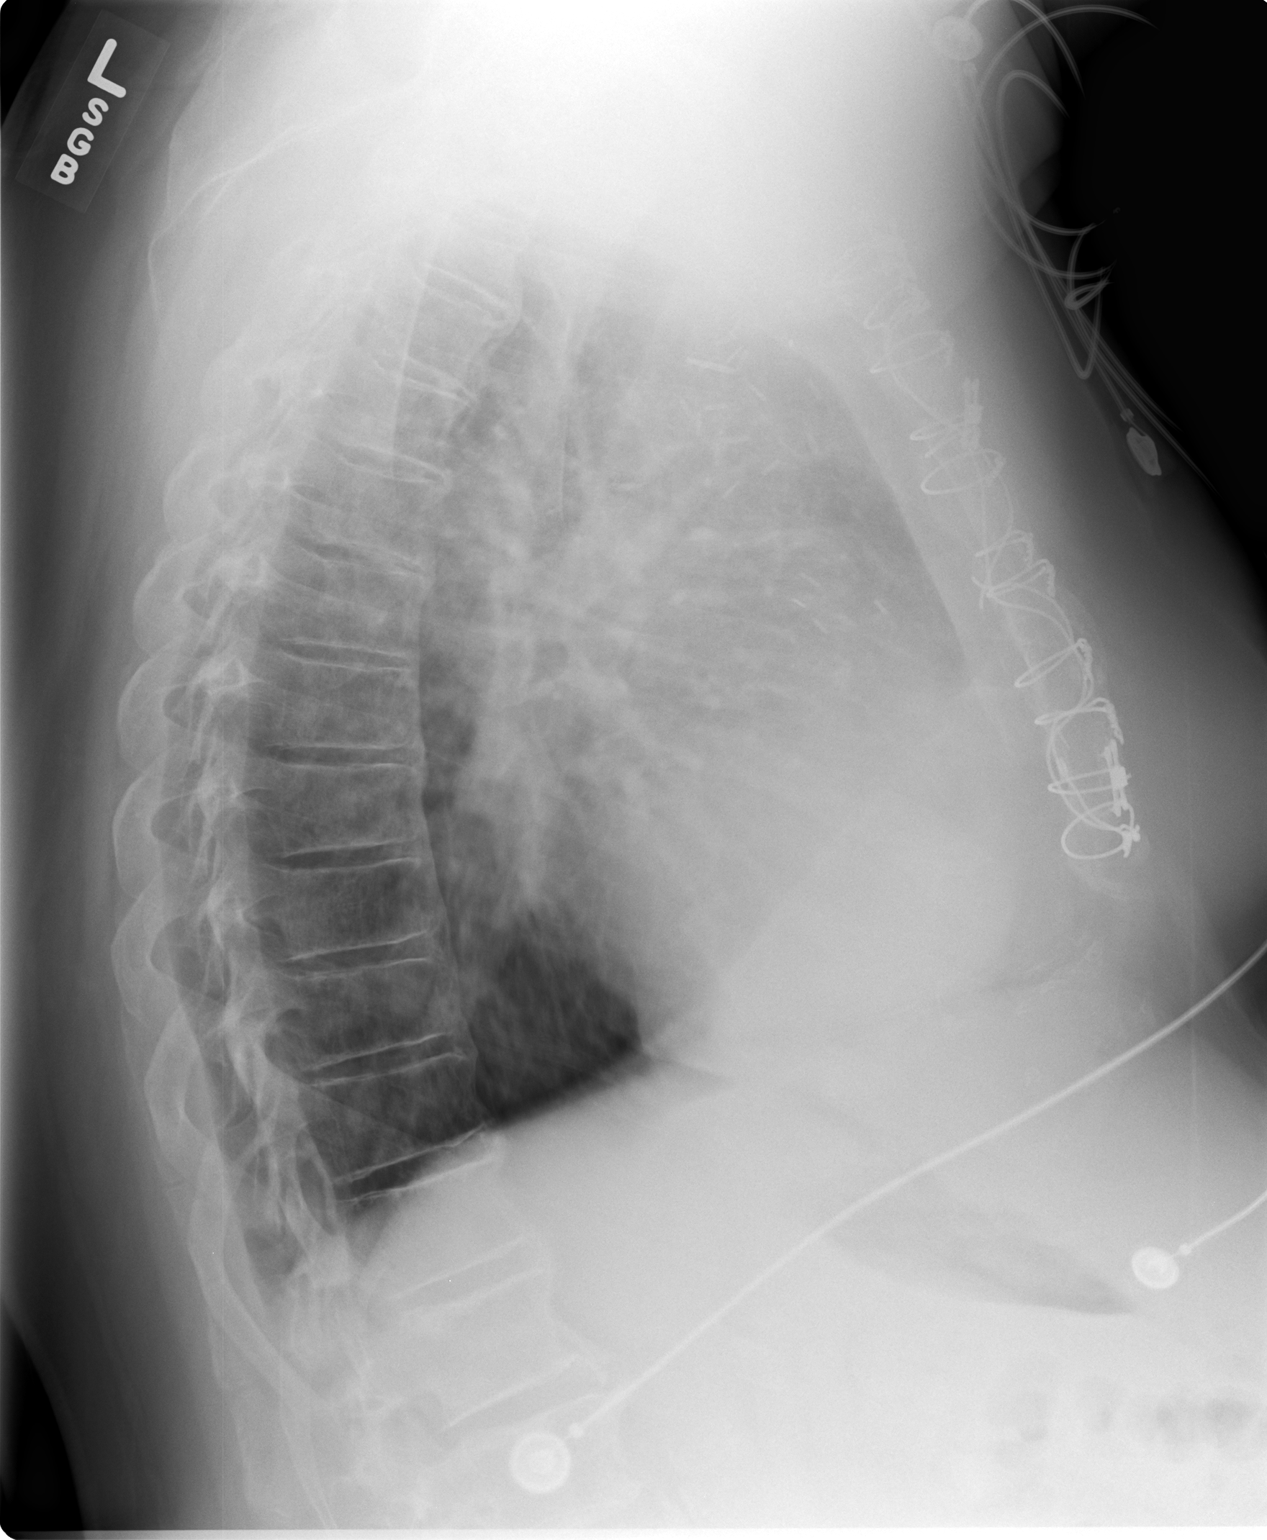

[2 of 2 positions shown; findings below may reference images not displayed]

DIAGNOSTIC STUDIES

EXAM

RADIOLOGICAL EXAMINATION, CHEST; 2 VIEWS FRONTAL AND LATERAL CPT 20444

INDICATION

dyspnea edema hx chf
PT. DYSPNEA EDEMA, HX CHF, PT.STATES LEG SWELLING.

TECHNIQUE

PA and lateral view chest

COMPARISONS

No prior studies are available for comparison.

FINDINGS

There are median sternotomy wires and surgical clips in the left hemi thorax. There is mild
pulmonary vascular prominence. There is diffuse flowing osteophytes of the thoracic spine. There is
no focal consolidation, effusion, or pneumothorax. Cardiac silhouette is within normal limits. The
bony thorax is intact.

IMPRESSION

Mild pulmonary vascular congestion.

## 2018-02-25 ENCOUNTER — Encounter: Admit: 2018-02-25 | Discharge: 2018-02-25 | Payer: MEDICARE

## 2018-02-25 MED FILL — PREDNISONE 5 MG PO TAB: 5 mg | ORAL | 30 days supply | Qty: 60 | Fill #2 | Status: AC

## 2018-03-03 ENCOUNTER — Ambulatory Visit: Admit: 2018-03-03 | Discharge: 2018-03-03 | Payer: MEDICARE

## 2018-03-03 ENCOUNTER — Encounter: Admit: 2018-03-03 | Discharge: 2018-03-03 | Payer: MEDICARE

## 2018-03-03 DIAGNOSIS — E119 Type 2 diabetes mellitus without complications: ICD-10-CM

## 2018-03-03 DIAGNOSIS — N189 Chronic kidney disease, unspecified: ICD-10-CM

## 2018-03-03 DIAGNOSIS — E039 Hypothyroidism, unspecified: ICD-10-CM

## 2018-03-03 DIAGNOSIS — A4902 Methicillin resistant Staphylococcus aureus infection, unspecified site: ICD-10-CM

## 2018-03-03 DIAGNOSIS — K219 Gastro-esophageal reflux disease without esophagitis: ICD-10-CM

## 2018-03-03 DIAGNOSIS — R062 Wheezing: ICD-10-CM

## 2018-03-03 DIAGNOSIS — I10 Essential (primary) hypertension: Principal | ICD-10-CM

## 2018-03-03 DIAGNOSIS — E669 Obesity, unspecified: ICD-10-CM

## 2018-03-03 DIAGNOSIS — J45909 Unspecified asthma, uncomplicated: ICD-10-CM

## 2018-03-03 DIAGNOSIS — Z87891 Personal history of nicotine dependence: ICD-10-CM

## 2018-03-03 DIAGNOSIS — I509 Heart failure, unspecified: ICD-10-CM

## 2018-03-03 DIAGNOSIS — I2510 Atherosclerotic heart disease of native coronary artery without angina pectoris: Principal | ICD-10-CM

## 2018-03-03 DIAGNOSIS — M1990 Unspecified osteoarthritis, unspecified site: ICD-10-CM

## 2018-03-03 DIAGNOSIS — B0229 Other postherpetic nervous system involvement: ICD-10-CM

## 2018-03-03 DIAGNOSIS — I214 Non-ST elevation (NSTEMI) myocardial infarction: ICD-10-CM

## 2018-03-03 DIAGNOSIS — J449 Chronic obstructive pulmonary disease, unspecified: ICD-10-CM

## 2018-03-03 DIAGNOSIS — D649 Anemia, unspecified: ICD-10-CM

## 2018-03-03 DIAGNOSIS — R0600 Dyspnea, unspecified: ICD-10-CM

## 2018-03-03 DIAGNOSIS — G4733 Obstructive sleep apnea (adult) (pediatric): ICD-10-CM

## 2018-03-03 DIAGNOSIS — E785 Hyperlipidemia, unspecified: ICD-10-CM

## 2018-03-04 ENCOUNTER — Encounter: Admit: 2018-03-04 | Discharge: 2018-03-04 | Payer: MEDICARE

## 2018-03-09 ENCOUNTER — Encounter: Admit: 2018-03-09 | Discharge: 2018-03-09 | Payer: MEDICARE

## 2018-03-09 ENCOUNTER — Encounter: Admit: 2018-03-09 | Discharge: 2018-03-10 | Payer: MEDICARE

## 2018-03-09 DIAGNOSIS — R062 Wheezing: ICD-10-CM

## 2018-03-09 DIAGNOSIS — C7951 Secondary malignant neoplasm of bone: ICD-10-CM

## 2018-03-09 DIAGNOSIS — R0600 Dyspnea, unspecified: ICD-10-CM

## 2018-03-09 DIAGNOSIS — C775 Secondary and unspecified malignant neoplasm of intrapelvic lymph nodes: ICD-10-CM

## 2018-03-09 DIAGNOSIS — R110 Nausea: ICD-10-CM

## 2018-03-09 DIAGNOSIS — Z87891 Personal history of nicotine dependence: ICD-10-CM

## 2018-03-09 DIAGNOSIS — J449 Chronic obstructive pulmonary disease, unspecified: ICD-10-CM

## 2018-03-09 DIAGNOSIS — Z006 Encounter for examination for normal comparison and control in clinical research program: ICD-10-CM

## 2018-03-09 DIAGNOSIS — C778 Secondary and unspecified malignant neoplasm of lymph nodes of multiple regions: ICD-10-CM

## 2018-03-09 DIAGNOSIS — I2510 Atherosclerotic heart disease of native coronary artery without angina pectoris: Principal | ICD-10-CM

## 2018-03-09 DIAGNOSIS — Z79818 Long term (current) use of other agents affecting estrogen receptors and estrogen levels: ICD-10-CM

## 2018-03-09 DIAGNOSIS — T451X5A Adverse effect of antineoplastic and immunosuppressive drugs, initial encounter: ICD-10-CM

## 2018-03-09 DIAGNOSIS — G4733 Obstructive sleep apnea (adult) (pediatric): ICD-10-CM

## 2018-03-09 DIAGNOSIS — E039 Hypothyroidism, unspecified: ICD-10-CM

## 2018-03-09 DIAGNOSIS — D500 Iron deficiency anemia secondary to blood loss (chronic): ICD-10-CM

## 2018-03-09 DIAGNOSIS — D519 Vitamin B12 deficiency anemia, unspecified: ICD-10-CM

## 2018-03-09 DIAGNOSIS — M1990 Unspecified osteoarthritis, unspecified site: ICD-10-CM

## 2018-03-09 DIAGNOSIS — E669 Obesity, unspecified: ICD-10-CM

## 2018-03-09 DIAGNOSIS — E119 Type 2 diabetes mellitus without complications: ICD-10-CM

## 2018-03-09 DIAGNOSIS — I509 Heart failure, unspecified: ICD-10-CM

## 2018-03-09 DIAGNOSIS — I214 Non-ST elevation (NSTEMI) myocardial infarction: ICD-10-CM

## 2018-03-09 DIAGNOSIS — A4902 Methicillin resistant Staphylococcus aureus infection, unspecified site: ICD-10-CM

## 2018-03-09 DIAGNOSIS — G893 Neoplasm related pain (acute) (chronic): ICD-10-CM

## 2018-03-09 DIAGNOSIS — C61 Malignant neoplasm of prostate: Secondary | ICD-10-CM

## 2018-03-09 DIAGNOSIS — K219 Gastro-esophageal reflux disease without esophagitis: ICD-10-CM

## 2018-03-09 DIAGNOSIS — D649 Anemia, unspecified: ICD-10-CM

## 2018-03-09 DIAGNOSIS — N189 Chronic kidney disease, unspecified: ICD-10-CM

## 2018-03-09 DIAGNOSIS — E785 Hyperlipidemia, unspecified: ICD-10-CM

## 2018-03-09 DIAGNOSIS — J45909 Unspecified asthma, uncomplicated: ICD-10-CM

## 2018-03-09 DIAGNOSIS — I10 Essential (primary) hypertension: ICD-10-CM

## 2018-03-09 DIAGNOSIS — B0229 Other postherpetic nervous system involvement: ICD-10-CM

## 2018-03-09 LAB — COMPREHENSIVE METABOLIC PANEL
Lab: 0.7 mg/dL — ABNORMAL LOW (ref 0.3–1.2)
Lab: 1.1 mg/dL (ref 0.4–1.24)
Lab: 139 MMOL/L — ABNORMAL LOW (ref 137–147)
Lab: 19 U/L (ref 7–40)
Lab: 217 mg/dL — ABNORMAL HIGH (ref 70–100)
Lab: 22 mg/dL — ABNORMAL HIGH (ref 7–25)
Lab: 3.7 g/dL (ref 3.5–5.0)
Lab: 4.1 MMOL/L (ref 3.5–5.1)
Lab: 6.7 g/dL (ref 6.0–8.0)
Lab: 8.9 mg/dL (ref 8.5–10.6)
Lab: 98 MMOL/L — ABNORMAL LOW (ref 98–110)

## 2018-03-09 LAB — TESTOSTERONE,TOTAL: Lab: 44 ng/dL — ABNORMAL LOW (ref 270–1070)

## 2018-03-09 LAB — IRON + BINDING CAPACITY + %SAT+ FERRITIN
Lab: 11 ng/mL — ABNORMAL LOW (ref 30–300)
Lab: 30 ug/dL — ABNORMAL LOW (ref 50–185)
Lab: 529 ug/dL — ABNORMAL HIGH (ref 270–380)
Lab: 6 % — ABNORMAL LOW (ref 28–42)

## 2018-03-09 LAB — PROSTATIC SPECIFIC ANTIGEN-PSA: Lab: 1.4 ng/mL — ABNORMAL LOW (ref <4.01)

## 2018-03-09 LAB — VITAMIN B12: Lab: 121 pg/mL — ABNORMAL LOW (ref 180–914)

## 2018-03-09 LAB — CBC AND DIFF: Lab: 4.9 10*3/uL (ref 4.5–11.0)

## 2018-03-09 MED ORDER — ONDANSETRON HCL 8 MG PO TAB
8 mg | ORAL_TABLET | ORAL | 3 refills | 8.00000 days | Status: AC | PRN
Start: 2018-03-09 — End: 2019-01-07

## 2018-03-09 MED ORDER — LEUPROLIDE (3 MONTH) 22.5 MG IM SYKT
22.5 mg | Freq: Once | INTRAMUSCULAR | 0 refills | Status: CP
Start: 2018-03-09 — End: ?
  Administered 2018-03-09: 16:00:00 22.5 mg via INTRAMUSCULAR

## 2018-03-09 MED ORDER — HYDROCODONE-ACETAMINOPHEN 10-325 MG PO TAB
1 | ORAL_TABLET | ORAL | 0 refills | 15.00000 days | Status: AC | PRN
Start: 2018-03-09 — End: 2018-03-11

## 2018-03-11 ENCOUNTER — Encounter: Admit: 2018-03-11 | Discharge: 2018-03-11 | Payer: MEDICARE

## 2018-03-11 DIAGNOSIS — G893 Neoplasm related pain (acute) (chronic): Principal | ICD-10-CM

## 2018-03-11 DIAGNOSIS — C61 Malignant neoplasm of prostate: ICD-10-CM

## 2018-03-11 MED ORDER — CYANOCOBALAMIN (VITAMIN B-12) 1,000 MCG/ML IJ SOLN
1000 ug | Freq: Once | INTRAMUSCULAR | 0 refills | Status: CN
Start: 2018-03-11 — End: ?

## 2018-03-11 MED ORDER — HYDROCODONE-ACETAMINOPHEN 10-325 MG PO TAB
1 | ORAL_TABLET | ORAL | 0 refills | 30.00000 days | Status: AC | PRN
Start: 2018-03-11 — End: 2018-04-20

## 2018-03-11 MED ORDER — FERROUS SULFATE 325 MG (65 MG IRON) PO TBEC
325 mg | Freq: Two times a day (BID) | ORAL | 0 refills | Status: AC
Start: 2018-03-11 — End: ?

## 2018-03-11 MED ORDER — HYDROCODONE-ACETAMINOPHEN 10-325 MG PO TAB
1 | ORAL_TABLET | ORAL | 0 refills | Status: CN | PRN
Start: 2018-03-11 — End: ?

## 2018-03-13 LAB — CBC AND DIFF

## 2018-03-17 ENCOUNTER — Encounter: Admit: 2018-03-17 | Discharge: 2018-03-17 | Payer: MEDICARE

## 2018-03-18 ENCOUNTER — Encounter: Admit: 2018-03-18 | Discharge: 2018-03-18 | Payer: MEDICARE

## 2018-03-18 DIAGNOSIS — C61 Malignant neoplasm of prostate: ICD-10-CM

## 2018-03-18 DIAGNOSIS — D649 Anemia, unspecified: Principal | ICD-10-CM

## 2018-03-23 ENCOUNTER — Encounter: Admit: 2018-03-23 | Discharge: 2018-03-23 | Payer: MEDICARE

## 2018-03-30 ENCOUNTER — Encounter: Admit: 2018-03-30 | Discharge: 2018-03-30 | Payer: MEDICARE

## 2018-03-30 MED FILL — PREDNISONE 5 MG PO TAB: 5 mg | ORAL | 30 days supply | Qty: 60 | Fill #3 | Status: AC

## 2018-04-15 ENCOUNTER — Ambulatory Visit: Admit: 2018-04-15 | Discharge: 2018-04-29 | Payer: MEDICARE

## 2018-04-20 ENCOUNTER — Encounter: Admit: 2018-04-20 | Discharge: 2018-04-20 | Payer: MEDICARE

## 2018-04-20 ENCOUNTER — Encounter: Admit: 2018-04-20 | Discharge: 2018-05-03 | Payer: MEDICARE

## 2018-04-20 ENCOUNTER — Ambulatory Visit: Admit: 2018-04-20 | Discharge: 2018-04-21 | Payer: MEDICARE

## 2018-04-20 DIAGNOSIS — I214 Non-ST elevation (NSTEMI) myocardial infarction: Secondary | ICD-10-CM

## 2018-04-20 DIAGNOSIS — I10 Essential (primary) hypertension: Secondary | ICD-10-CM

## 2018-04-20 DIAGNOSIS — B0229 Other postherpetic nervous system involvement: Secondary | ICD-10-CM

## 2018-04-20 DIAGNOSIS — J45909 Unspecified asthma, uncomplicated: Secondary | ICD-10-CM

## 2018-04-20 DIAGNOSIS — J449 Chronic obstructive pulmonary disease, unspecified: Secondary | ICD-10-CM

## 2018-04-20 DIAGNOSIS — R0600 Dyspnea, unspecified: Secondary | ICD-10-CM

## 2018-04-20 DIAGNOSIS — E785 Hyperlipidemia, unspecified: Secondary | ICD-10-CM

## 2018-04-20 DIAGNOSIS — Z87891 Personal history of nicotine dependence: Secondary | ICD-10-CM

## 2018-04-20 DIAGNOSIS — M1990 Unspecified osteoarthritis, unspecified site: Secondary | ICD-10-CM

## 2018-04-20 DIAGNOSIS — E039 Hypothyroidism, unspecified: Secondary | ICD-10-CM

## 2018-04-20 DIAGNOSIS — K219 Gastro-esophageal reflux disease without esophagitis: Secondary | ICD-10-CM

## 2018-04-20 DIAGNOSIS — D649 Anemia, unspecified: Secondary | ICD-10-CM

## 2018-04-20 DIAGNOSIS — E119 Type 2 diabetes mellitus without complications: Secondary | ICD-10-CM

## 2018-04-20 DIAGNOSIS — N189 Chronic kidney disease, unspecified: Secondary | ICD-10-CM

## 2018-04-20 DIAGNOSIS — A4902 Methicillin resistant Staphylococcus aureus infection, unspecified site: Secondary | ICD-10-CM

## 2018-04-20 DIAGNOSIS — E669 Obesity, unspecified: Secondary | ICD-10-CM

## 2018-04-20 DIAGNOSIS — G4733 Obstructive sleep apnea (adult) (pediatric): Secondary | ICD-10-CM

## 2018-04-20 DIAGNOSIS — I509 Heart failure, unspecified: Secondary | ICD-10-CM

## 2018-04-20 DIAGNOSIS — I2510 Atherosclerotic heart disease of native coronary artery without angina pectoris: Secondary | ICD-10-CM

## 2018-04-20 LAB — COMPREHENSIVE METABOLIC PANEL
Lab: 0.7 mg/dL (ref 0.3–1.2)
Lab: 0.9 mg/dL — ABNORMAL HIGH (ref 0.4–1.24)
Lab: 12 10*3/uL — ABNORMAL LOW (ref 3–12)
Lab: 137 MMOL/L — ABNORMAL LOW (ref 137–147)
Lab: 17 U/L (ref 7–56)
Lab: 178 U/L — ABNORMAL HIGH (ref 25–110)
Lab: 20 mg/dL — ABNORMAL LOW (ref 7–25)
Lab: 25 U/L (ref 7–40)
Lab: 274 mg/dL — ABNORMAL HIGH (ref 70–100)
Lab: 28 MMOL/L (ref 21–30)
Lab: 3.6 g/dL — ABNORMAL LOW (ref 3.5–5.0)
Lab: 3.8 MMOL/L — ABNORMAL LOW (ref 3.5–5.1)
Lab: 6.7 g/dL (ref 6.0–8.0)
Lab: 8.7 mg/dL (ref 8.5–10.6)
Lab: >60 mL/min (ref >60)
Lab: >60 mL/min (ref >60)

## 2018-04-20 LAB — CBC AND DIFF
Lab: 0 10*3/uL (ref 0–0.20)
Lab: 3.2 M/UL — ABNORMAL LOW (ref 4.4–5.5)
Lab: 3.7 10*3/uL — ABNORMAL LOW (ref 4.5–11.0)

## 2018-04-20 LAB — PROSTATIC SPECIFIC ANTIGEN-PSA: Lab: 1.9 ng/mL — ABNORMAL LOW (ref <4.01)

## 2018-04-20 LAB — POC CREATININE, RAD: Lab: 1 mg/dL (ref 0.4–1.24)

## 2018-04-20 MED ORDER — MORPHINE 15 MG PO TBER
15 mg | ORAL_TABLET | Freq: Every evening | ORAL | 0 refills | 7.00000 days | Status: AC
Start: 2018-04-20 — End: 2018-05-25
  Filled 2018-04-20 (×2): qty 30, 30d supply, fill #1

## 2018-04-20 MED ORDER — RP DX TC-99M MEDRONATE MCI
25 | Freq: Once | INTRAVENOUS | 0 refills | Status: CP
Start: 2018-04-20 — End: ?
  Administered 2018-04-20: 15:00:00 26.3 via INTRAVENOUS

## 2018-04-20 MED ORDER — IOHEXOL 350 MG IODINE/ML IV SOLN
100 mL | Freq: Once | INTRAVENOUS | 0 refills | Status: CP
Start: 2018-04-20 — End: ?
  Administered 2018-04-20: 16:00:00 100 mL via INTRAVENOUS

## 2018-04-20 MED ORDER — HYDROCODONE-ACETAMINOPHEN 10-325 MG PO TAB
1 | ORAL_TABLET | ORAL | 0 refills | 30.00000 days | Status: AC | PRN
Start: 2018-04-20 — End: 2018-07-02
  Filled 2018-04-20 (×2): qty 120, 15d supply, fill #1

## 2018-04-20 MED ORDER — SODIUM CHLORIDE 0.9 % IJ SOLN
50 mL | Freq: Once | INTRAVENOUS | 0 refills | Status: CP
Start: 2018-04-20 — End: ?
  Administered 2018-04-20: 16:00:00 50 mL via INTRAVENOUS

## 2018-04-27 ENCOUNTER — Encounter: Admit: 2018-04-27 | Discharge: 2018-04-27 | Payer: MEDICARE

## 2018-04-27 ENCOUNTER — Ambulatory Visit: Admit: 2018-04-27 | Discharge: 2018-04-27 | Payer: MEDICARE

## 2018-04-27 DIAGNOSIS — I509 Heart failure, unspecified: Secondary | ICD-10-CM

## 2018-04-27 DIAGNOSIS — G4733 Obstructive sleep apnea (adult) (pediatric): Secondary | ICD-10-CM

## 2018-04-27 DIAGNOSIS — D649 Anemia, unspecified: Secondary | ICD-10-CM

## 2018-04-27 DIAGNOSIS — E119 Type 2 diabetes mellitus without complications: Secondary | ICD-10-CM

## 2018-04-27 DIAGNOSIS — E785 Hyperlipidemia, unspecified: Secondary | ICD-10-CM

## 2018-04-27 DIAGNOSIS — J449 Chronic obstructive pulmonary disease, unspecified: Secondary | ICD-10-CM

## 2018-04-27 DIAGNOSIS — Z87891 Personal history of nicotine dependence: Secondary | ICD-10-CM

## 2018-04-27 DIAGNOSIS — N189 Chronic kidney disease, unspecified: Secondary | ICD-10-CM

## 2018-04-27 DIAGNOSIS — R0600 Dyspnea, unspecified: Secondary | ICD-10-CM

## 2018-04-27 DIAGNOSIS — M1990 Unspecified osteoarthritis, unspecified site: Secondary | ICD-10-CM

## 2018-04-27 DIAGNOSIS — A4902 Methicillin resistant Staphylococcus aureus infection, unspecified site: Secondary | ICD-10-CM

## 2018-04-27 DIAGNOSIS — I10 Essential (primary) hypertension: Secondary | ICD-10-CM

## 2018-04-27 DIAGNOSIS — K219 Gastro-esophageal reflux disease without esophagitis: Secondary | ICD-10-CM

## 2018-04-27 DIAGNOSIS — E039 Hypothyroidism, unspecified: Secondary | ICD-10-CM

## 2018-04-27 DIAGNOSIS — I2510 Atherosclerotic heart disease of native coronary artery without angina pectoris: Secondary | ICD-10-CM

## 2018-04-27 DIAGNOSIS — E669 Obesity, unspecified: Secondary | ICD-10-CM

## 2018-04-27 DIAGNOSIS — B0229 Other postherpetic nervous system involvement: Secondary | ICD-10-CM

## 2018-04-27 DIAGNOSIS — J45909 Unspecified asthma, uncomplicated: Secondary | ICD-10-CM

## 2018-04-27 DIAGNOSIS — I214 Non-ST elevation (NSTEMI) myocardial infarction: Secondary | ICD-10-CM

## 2018-04-29 DIAGNOSIS — C61 Malignant neoplasm of prostate: Secondary | ICD-10-CM

## 2018-04-30 ENCOUNTER — Encounter: Admit: 2018-04-30 | Discharge: 2018-04-30 | Payer: MEDICARE

## 2018-04-30 ENCOUNTER — Ambulatory Visit: Admit: 2018-04-30 | Discharge: 2018-05-15 | Payer: MEDICARE

## 2018-04-30 MED FILL — PREDNISONE 5 MG PO TAB: 5 mg | ORAL | 30 days supply | Qty: 60 | Fill #4 | Status: AC

## 2018-05-03 ENCOUNTER — Encounter: Admit: 2018-04-20 | Discharge: 2018-04-20 | Payer: MEDICARE

## 2018-05-03 DIAGNOSIS — C7951 Secondary malignant neoplasm of bone: Secondary | ICD-10-CM

## 2018-05-03 DIAGNOSIS — C61 Malignant neoplasm of prostate: Secondary | ICD-10-CM

## 2018-05-03 DIAGNOSIS — D630 Anemia in neoplastic disease: Secondary | ICD-10-CM

## 2018-05-03 DIAGNOSIS — G893 Neoplasm related pain (acute) (chronic): Secondary | ICD-10-CM

## 2018-05-03 DIAGNOSIS — C775 Secondary and unspecified malignant neoplasm of intrapelvic lymph nodes: Secondary | ICD-10-CM

## 2018-05-03 DIAGNOSIS — Z006 Encounter for examination for normal comparison and control in clinical research program: Secondary | ICD-10-CM

## 2018-05-05 ENCOUNTER — Encounter: Admit: 2018-05-05 | Discharge: 2018-05-05 | Payer: MEDICARE

## 2018-05-07 ENCOUNTER — Encounter: Admit: 2018-05-07 | Discharge: 2018-05-07 | Payer: MEDICARE

## 2018-05-13 ENCOUNTER — Ambulatory Visit: Admit: 2018-05-13 | Discharge: 2018-05-13 | Payer: MEDICARE

## 2018-05-13 ENCOUNTER — Ambulatory Visit: Admit: 2018-05-13 | Discharge: 2018-05-14 | Payer: MEDICARE

## 2018-05-13 ENCOUNTER — Encounter: Admit: 2018-05-13 | Discharge: 2018-05-13 | Payer: MEDICARE

## 2018-05-13 ENCOUNTER — Emergency Department: Admit: 2018-05-13 | Discharge: 2018-05-13 | Payer: MEDICARE | Attending: Critical Care Medicine

## 2018-05-13 DIAGNOSIS — I2510 Atherosclerotic heart disease of native coronary artery without angina pectoris: Secondary | ICD-10-CM

## 2018-05-13 DIAGNOSIS — J45909 Unspecified asthma, uncomplicated: Secondary | ICD-10-CM

## 2018-05-13 DIAGNOSIS — E785 Hyperlipidemia, unspecified: Secondary | ICD-10-CM

## 2018-05-13 DIAGNOSIS — C61 Malignant neoplasm of prostate: Secondary | ICD-10-CM

## 2018-05-13 DIAGNOSIS — G4733 Obstructive sleep apnea (adult) (pediatric): Secondary | ICD-10-CM

## 2018-05-13 DIAGNOSIS — D649 Anemia, unspecified: Secondary | ICD-10-CM

## 2018-05-13 DIAGNOSIS — E669 Obesity, unspecified: Secondary | ICD-10-CM

## 2018-05-13 DIAGNOSIS — R0600 Dyspnea, unspecified: Secondary | ICD-10-CM

## 2018-05-13 DIAGNOSIS — I214 Non-ST elevation (NSTEMI) myocardial infarction: Secondary | ICD-10-CM

## 2018-05-13 DIAGNOSIS — A4902 Methicillin resistant Staphylococcus aureus infection, unspecified site: Secondary | ICD-10-CM

## 2018-05-13 DIAGNOSIS — E119 Type 2 diabetes mellitus without complications: Secondary | ICD-10-CM

## 2018-05-13 DIAGNOSIS — I469 Cardiac arrest, cause unspecified: ICD-10-CM

## 2018-05-13 DIAGNOSIS — B0229 Other postherpetic nervous system involvement: Secondary | ICD-10-CM

## 2018-05-13 DIAGNOSIS — E039 Hypothyroidism, unspecified: Secondary | ICD-10-CM

## 2018-05-13 DIAGNOSIS — J449 Chronic obstructive pulmonary disease, unspecified: Secondary | ICD-10-CM

## 2018-05-13 DIAGNOSIS — M1990 Unspecified osteoarthritis, unspecified site: Secondary | ICD-10-CM

## 2018-05-13 DIAGNOSIS — Z87891 Personal history of nicotine dependence: Secondary | ICD-10-CM

## 2018-05-13 DIAGNOSIS — I509 Heart failure, unspecified: Secondary | ICD-10-CM

## 2018-05-13 DIAGNOSIS — I10 Essential (primary) hypertension: Secondary | ICD-10-CM

## 2018-05-13 DIAGNOSIS — N189 Chronic kidney disease, unspecified: Secondary | ICD-10-CM

## 2018-05-13 DIAGNOSIS — K219 Gastro-esophageal reflux disease without esophagitis: Secondary | ICD-10-CM

## 2018-05-13 LAB — POC IONIZED CALCIUM: Lab: 1.1 MMOL/L (ref 1.0–1.3)

## 2018-05-13 LAB — POC CREATININE, RAD: Lab: 1.4 mg/dL — ABNORMAL HIGH (ref 0.4–1.24)

## 2018-05-13 LAB — POC BLOOD GAS VEN
Lab: 0 MMOL/L
Lab: 115 mmHg — ABNORMAL HIGH (ref 33–48)
Lab: 28 MMOL/L
Lab: 7.1 — CL (ref 7.30–7.40)
Lab: 80 mmHg — ABNORMAL HIGH (ref 36–50)
Lab: 97 % — ABNORMAL HIGH (ref 55–71)

## 2018-05-13 LAB — POC POTASSIUM: Lab: 4.7 MMOL/L (ref 3.5–5.1)

## 2018-05-13 LAB — POC GLUCOSE: Lab: 260 mg/dL — ABNORMAL HIGH (ref 70–100)

## 2018-05-13 LAB — POC HEMATOCRIT
Lab: 11 g/dL — ABNORMAL LOW (ref 13.5–16.5)
Lab: 35 % — ABNORMAL LOW (ref 40–50)

## 2018-05-13 LAB — BNP POC ER: Lab: 177 pg/mL — ABNORMAL HIGH (ref 0–100)

## 2018-05-13 LAB — POC LACTATE: Lab: 5.6 MMOL/L — ABNORMAL HIGH (ref 0.5–2.0)

## 2018-05-13 LAB — POC SODIUM: Lab: 135 MMOL/L — ABNORMAL LOW (ref 137–147)

## 2018-05-13 LAB — POC TROPONIN: Lab: 0 ng/mL (ref 0.00–0.05)

## 2018-05-13 MED ORDER — FAMOTIDINE (PF) 20 MG/2 ML IV SOLN
40 mg | Freq: Two times a day (BID) | INTRAVENOUS | 0 refills | Status: DC
Start: 2018-05-13 — End: 2018-05-15
  Administered 2018-05-14 – 2018-05-15 (×2): 40 mg via INTRAVENOUS

## 2018-05-13 MED ORDER — ALBUTEROL SULFATE 2.5 MG/0.5 ML IN NEBU
2.5 mg | Freq: Once | RESPIRATORY_TRACT | 0 refills | Status: CP
Start: 2018-05-13 — End: ?
  Administered 2018-05-13: 2.5 mg via RESPIRATORY_TRACT

## 2018-05-13 MED ORDER — DIPHENHYDRAMINE HCL 50 MG/ML IJ SOLN
50 mg | INTRAVENOUS | 0 refills | Status: DC
Start: 2018-05-13 — End: 2018-05-14

## 2018-05-13 MED ORDER — METHYLPREDNISOLONE SOD SUC(PF) 125 MG/2 ML IJ SOLR
125 mg | INTRAVENOUS | 0 refills | Status: DC
Start: 2018-05-13 — End: 2018-05-15
  Administered 2018-05-14 – 2018-05-15 (×6): 125 mg via INTRAVENOUS

## 2018-05-13 MED ORDER — FENTANYL CITRATE (PF) 50 MCG/ML IJ SOLN
50 ug | Freq: Once | INTRAVENOUS | 0 refills | Status: CP
Start: 2018-05-13 — End: ?
  Administered 2018-05-14: 02:00:00 50 ug via INTRAVENOUS

## 2018-05-13 MED ORDER — GADOBENATE DIMEGLUMINE 529 MG/ML (0.1MMOL/0.2ML) IV SOLN
20 mL | Freq: Once | INTRAVENOUS | 0 refills | Status: CP
Start: 2018-05-13 — End: ?
  Administered 2018-05-13: 23:00:00 20 mL via INTRAVENOUS

## 2018-05-13 MED ORDER — DIPHENHYDRAMINE HCL 50 MG/ML IJ SOLN
25 mg | INTRAVENOUS | 0 refills | Status: DC
Start: 2018-05-13 — End: 2018-05-15
  Administered 2018-05-14 – 2018-05-15 (×6): 25 mg via INTRAVENOUS

## 2018-05-13 MED ORDER — ALBUTEROL SULFATE 2.5 MG/0.5 ML IN NEBU
2.5 mg | RESPIRATORY_TRACT | 0 refills | Status: DC | PRN
Start: 2018-05-13 — End: 2018-05-15
  Administered 2018-05-14 (×3): 2.5 mg via RESPIRATORY_TRACT

## 2018-05-13 MED ORDER — INSULIN REGULAR IN 0.9 % NACL 100 UNIT/100 ML (1 UNIT/ML) IV SOLN
1-32 [IU]/h | INTRAVENOUS | 0 refills | Status: DC
Start: 2018-05-13 — End: 2018-05-14
  Administered 2018-05-14: 17:00:00 7 [IU]/h via INTRAVENOUS
  Administered 2018-05-14: 06:00:00 4 [IU]/h via INTRAVENOUS

## 2018-05-13 MED ORDER — LACTATED RINGERS IV SOLP
500 mL | INTRAVENOUS | 0 refills | Status: CP
Start: 2018-05-13 — End: ?

## 2018-05-13 MED ORDER — MAGNESIUM SULFATE IN D5W 1 GRAM/100 ML IV PGBK
1 g | Freq: Once | INTRAVENOUS | 0 refills | Status: CP
Start: 2018-05-13 — End: ?
  Administered 2018-05-14: 06:00:00 1 g via INTRAVENOUS

## 2018-05-13 MED ORDER — HEPARIN, PORCINE (PF) 5,000 UNIT/0.5 ML IJ SYRG
5000 [IU] | SUBCUTANEOUS | 0 refills | Status: DC
Start: 2018-05-13 — End: 2018-05-15
  Administered 2018-05-14 – 2018-05-15 (×5): 5000 [IU] via SUBCUTANEOUS

## 2018-05-13 MED ORDER — IPRATROPIUM BROMIDE 0.02 % IN SOLN
0.5 mg | Freq: Once | RESPIRATORY_TRACT | 0 refills | Status: CP
Start: 2018-05-13 — End: ?
  Administered 2018-05-13: 0.5 mg via RESPIRATORY_TRACT

## 2018-05-13 MED ORDER — EPINEPHRINE HCL (PF) 1 MG/ML (1 ML) IJ SOLN
.3 mg | Freq: Once | INTRAMUSCULAR | 0 refills | Status: CP
Start: 2018-05-13 — End: ?
  Administered 2018-05-13: 0.3 mg via INTRAMUSCULAR

## 2018-05-13 MED ORDER — IPRATROPIUM BROMIDE 0.02 % IN SOLN
.5 mg | RESPIRATORY_TRACT | 0 refills | Status: DC | PRN
Start: 2018-05-13 — End: 2018-05-15
  Administered 2018-05-14 (×3): 0.5 mg via RESPIRATORY_TRACT

## 2018-05-13 MED ORDER — INSULIN ASPART 100 UNIT/ML SC FLEXPEN
0-6 [IU] | Freq: Before meals | SUBCUTANEOUS | 0 refills | Status: DC
Start: 2018-05-13 — End: 2018-05-14
  Administered 2018-05-14: 05:00:00 3 [IU] via SUBCUTANEOUS

## 2018-05-13 MED ORDER — FENTANYL CITRATE (PF) 50 MCG/ML IJ SOLN
50 ug | Freq: Once | INTRAVENOUS | 0 refills | Status: CP
Start: 2018-05-13 — End: ?
  Administered 2018-05-14: 01:00:00 50 ug via INTRAVENOUS

## 2018-05-13 MED ORDER — FENTANYL CITRATE (PF) 50 MCG/ML IJ SOLN
50 ug | Freq: Once | INTRAVENOUS | 0 refills | Status: CP
Start: 2018-05-13 — End: ?
  Administered 2018-05-13: 50 ug via INTRAVENOUS

## 2018-05-13 MED ORDER — FAMOTIDINE (PF) 20 MG/2 ML IV SOLN
20 mg | Freq: Once | INTRAVENOUS | 0 refills | Status: CP
Start: 2018-05-13 — End: ?
  Administered 2018-05-13: 20 mg via INTRAVENOUS

## 2018-05-13 MED ORDER — FENTANYL CITRATE (PF) 50 MCG/ML IJ SOLN
12.5-25 ug | INTRAVENOUS | 0 refills | Status: DC | PRN
Start: 2018-05-13 — End: 2018-05-16
  Administered 2018-05-14: 04:00:00 12.5 ug via INTRAVENOUS
  Administered 2018-05-14 – 2018-05-15 (×3): 25 ug via INTRAVENOUS

## 2018-05-13 MED ORDER — LACTATED RINGERS IV SOLP
250 mL | Freq: Once | INTRAVENOUS | 0 refills | Status: CP
Start: 2018-05-13 — End: ?
  Administered 2018-05-14: 05:00:00 250 mL via INTRAVENOUS

## 2018-05-13 MED ORDER — INSULIN ASPART 100 UNIT/ML SC FLEXPEN
0-24 [IU] | Freq: Every day | SUBCUTANEOUS | 0 refills | Status: DC
Start: 2018-05-13 — End: 2018-05-14

## 2018-05-14 ENCOUNTER — Encounter: Admit: 2018-05-14 | Discharge: 2018-05-14 | Payer: MEDICARE

## 2018-05-14 DIAGNOSIS — I469 Cardiac arrest, cause unspecified: ICD-10-CM

## 2018-05-14 LAB — TROPONIN-I
Lab: 0 ng/mL — ABNORMAL HIGH (ref 0.0–0.05)
Lab: 0 ng/mL — ABNORMAL HIGH (ref 0.0–0.05)
Lab: 0 ng/mL — ABNORMAL HIGH (ref 0.0–0.05)
Lab: 0.1 ng/mL — ABNORMAL HIGH (ref 0.0–0.05)

## 2018-05-14 LAB — COMPREHENSIVE METABOLIC PANEL
Lab: 0.6 mg/dL (ref 0.3–1.2)
Lab: 0.9 mg/dL — ABNORMAL HIGH (ref 0.3–1.2)
Lab: 1.3 mg/dL — ABNORMAL HIGH (ref 0.4–1.24)
Lab: 1.4 mg/dL — ABNORMAL HIGH (ref 0.4–1.24)
Lab: 137 MMOL/L — ABNORMAL LOW (ref 137–147)
Lab: 139 MMOL/L — ABNORMAL LOW (ref 137–147)
Lab: 17 — ABNORMAL HIGH (ref 3–12)
Lab: 215 U/L — ABNORMAL HIGH (ref 25–110)
Lab: 25 mg/dL — ABNORMAL HIGH (ref 7–25)
Lab: 277 mg/dL — ABNORMAL HIGH (ref 70–100)
Lab: 3.3 g/dL — ABNORMAL LOW (ref 3.5–5.0)
Lab: 3.5 g/dL — ABNORMAL LOW (ref 3.5–5.0)
Lab: 31 U/L (ref 7–56)
Lab: 34 U/L — ABNORMAL LOW (ref 7–56)
Lab: 4.7 MMOL/L — AB (ref 3.5–5.1)
Lab: 53 mL/min — ABNORMAL LOW (ref >60)
Lab: 59 U/L — ABNORMAL HIGH (ref 7–40)
Lab: 6.4 g/dL (ref 6.0–8.0)
Lab: 6.8 g/dL (ref 6.0–8.0)
Lab: 8.5 mg/dL (ref 8.5–10.6)
Lab: 8.7 mg/dL (ref 8.5–10.6)
Lab: 98 MMOL/L (ref 98–110)
Lab: >60 mL/min (ref >60)

## 2018-05-14 LAB — POC BLOOD GAS ARTERIAL
Lab: 3 MMOL/L
Lab: 31 MMOL/L — ABNORMAL HIGH (ref 21–28)
Lab: 45 % — ABNORMAL LOW (ref 95–99)
Lab: 7.2 — ABNORMAL LOW (ref 7.35–7.45)
Lab: 78 mmHg — ABNORMAL HIGH (ref 35–45)

## 2018-05-14 LAB — CBC AND DIFF
Lab: 0.1 K/UL — ABNORMAL LOW (ref 0–0.20)
Lab: 1 % — ABNORMAL HIGH (ref 0–5)
Lab: 13 10*3/uL — ABNORMAL HIGH (ref 4.5–11.0)
Lab: 25 pg — ABNORMAL LOW (ref 26–34)
Lab: 3 % — ABNORMAL LOW (ref 4–12)
Lab: 87 FL — ABNORMAL HIGH (ref >40)

## 2018-05-14 LAB — BLOOD GASES, PERIPHERAL VENOUS
Lab: 24 MMOL/L (ref 80–100)
Lab: 56 mmHg — ABNORMAL HIGH (ref 36–50)
Lab: 67 mmHg — ABNORMAL HIGH (ref 33–48)
Lab: 7.2 — ABNORMAL LOW (ref 7.30–7.40)
Lab: 1.7 MMOL/L — ABNORMAL LOW (ref 4.5–11.0)

## 2018-05-14 LAB — POC BLOOD GAS VEN
Lab: 31 MMOL/L
Lab: 33 mmHg (ref 33–48)
Lab: 4 MMOL/L
Lab: 49 % — ABNORMAL LOW (ref 55–71)
Lab: 7.2 — ABNORMAL LOW (ref 7.30–7.40)
Lab: 78 mmHg — ABNORMAL HIGH (ref 36–50)

## 2018-05-14 LAB — URINALYSIS DIPSTICK
Lab: NEGATIVE
Lab: NEGATIVE
Lab: NEGATIVE
Lab: NEGATIVE

## 2018-05-14 LAB — BLOOD GASES, ARTERIAL
Lab: 1.4 MMOL/L
Lab: 117 mmHg — ABNORMAL HIGH (ref 80–100)
Lab: 23 MMOL/L (ref 21–28)
Lab: 52 mmHg — ABNORMAL HIGH (ref 35–45)
Lab: 7.3 — ABNORMAL LOW (ref 7.35–7.45)

## 2018-05-14 LAB — PTT (APTT)
Lab: 33 s — ABNORMAL LOW (ref 24.0–36.5)
Lab: 34 s — ABNORMAL LOW (ref 24.0–36.5)

## 2018-05-14 LAB — CBC
Lab: 5.8 10*3/uL — ABNORMAL LOW (ref 4.5–11.0)
Lab: 8 FL — ABNORMAL LOW (ref 7–11)

## 2018-05-14 LAB — POC GLUCOSE
Lab: 341 mg/dL — ABNORMAL HIGH (ref 70–100)
Lab: 358 mg/dL — ABNORMAL HIGH (ref 70–100)
Lab: 383 mg/dL — ABNORMAL HIGH (ref 70–100)
Lab: 358 mg/dL — ABNORMAL HIGH (ref >60)
Lab: 360 mg/dL — ABNORMAL HIGH (ref 70–100)
Lab: 350 mg/dL — ABNORMAL HIGH (ref 70–100)
Lab: 309 mg/dL — ABNORMAL HIGH (ref 70–100)
Lab: 286 mg/dL — ABNORMAL HIGH (ref 70–100)
Lab: 263 mg/dL — ABNORMAL HIGH (ref 70–100)
Lab: 256 mg/dL — ABNORMAL HIGH (ref 70–100)
Lab: 211 mg/dL — ABNORMAL HIGH (ref 70–100)
Lab: 184 mg/dL — ABNORMAL HIGH (ref 70–100)
Lab: 173 mg/dL — ABNORMAL HIGH (ref 70–100)
Lab: 149 mg/dL — ABNORMAL HIGH (ref >60)
Lab: 371 mg/dL — ABNORMAL HIGH (ref 70–100)

## 2018-05-14 LAB — URINALYSIS, MICROSCOPIC

## 2018-05-14 LAB — MAGNESIUM
Lab: 1.9 mg/dL (ref 1.6–2.6)
Lab: 2.2 mg/dL — ABNORMAL HIGH (ref >40)

## 2018-05-14 LAB — BETA HYDROXYBUTYRATE (KETONES): Lab: 0.9 MMOL/L — ABNORMAL HIGH (ref <0.3)

## 2018-05-14 LAB — POC LACTATE: Lab: 6 MMOL/L — ABNORMAL HIGH (ref 0.5–2.0)

## 2018-05-14 LAB — TSH WITH FREE T4 REFLEX: Lab: 0.6 uU/mL (ref 0.35–5.00)

## 2018-05-14 LAB — LACTIC ACID (BG - RAPID LACTATE)
Lab: 5.4 MMOL/L — ABNORMAL HIGH (ref 0.5–2.0)
Lab: 3.5 MMOL/L — ABNORMAL HIGH (ref 0.5–2.0)

## 2018-05-14 LAB — PROTIME INR (PT)
Lab: 1.3 M/UL — ABNORMAL HIGH (ref 0.8–1.2)
Lab: 1.3 M/UL — ABNORMAL HIGH (ref 0.8–1.2)

## 2018-05-14 LAB — BNP POC ER: Lab: 149 pg/mL — ABNORMAL HIGH (ref 0–100)

## 2018-05-14 LAB — BASIC METABOLIC PANEL: Lab: 138 MMOL/L — ABNORMAL LOW (ref 137–147)

## 2018-05-14 LAB — PHOSPHORUS: Lab: 4.8 mg/dL — ABNORMAL HIGH (ref 2.0–4.5)

## 2018-05-14 MED ORDER — PATCH DOCUMENTATION - LIDOCAINE 5%
Freq: Two times a day (BID) | TRANSDERMAL | 0 refills | Status: DC
Start: 2018-05-14 — End: 2018-05-16

## 2018-05-14 MED ORDER — INSULIN ASPART 100 UNIT/ML SC FLEXPEN
10 [IU] | Freq: Three times a day (TID) | SUBCUTANEOUS | 0 refills | Status: DC
Start: 2018-05-14 — End: 2018-05-15

## 2018-05-14 MED ORDER — LIDOCAINE 5 % TP PTMD
1-2 | Freq: Every day | TOPICAL | 0 refills | Status: DC
Start: 2018-05-14 — End: 2018-05-16
  Administered 2018-05-14 – 2018-05-15 (×2): 1 via TOPICAL

## 2018-05-14 MED ORDER — INSULIN ASPART 100 UNIT/ML SC FLEXPEN
0-24 [IU] | Freq: Before meals | SUBCUTANEOUS | 0 refills | Status: DC
Start: 2018-05-14 — End: 2018-05-15

## 2018-05-14 MED ORDER — CARVEDILOL 3.125 MG PO TAB
3.125 mg | Freq: Two times a day (BID) | ORAL | 0 refills | Status: DC
Start: 2018-05-14 — End: 2018-05-16
  Administered 2018-05-15 (×2): 3.125 mg via ORAL

## 2018-05-14 MED ORDER — PERFLUTREN LIPID MICROSPHERES 1.1 MG/ML IV SUSP
1-20 mL | Freq: Once | INTRAVENOUS | 0 refills | Status: CP | PRN
Start: 2018-05-14 — End: ?
  Administered 2018-05-14: 15:00:00 1 mL via INTRAVENOUS

## 2018-05-14 MED ORDER — MORPHINE 15 MG PO TBER
15 mg | Freq: Every evening | ORAL | 0 refills | Status: DC
Start: 2018-05-14 — End: 2018-05-16
  Administered 2018-05-15: 02:00:00 15 mg via ORAL

## 2018-05-14 MED ORDER — SERTRALINE 50 MG PO TAB
50 mg | Freq: Every day | ORAL | 0 refills | Status: DC
Start: 2018-05-14 — End: 2018-05-16
  Administered 2018-05-14 – 2018-05-15 (×2): 50 mg via ORAL

## 2018-05-14 MED ORDER — HYDROCODONE-ACETAMINOPHEN 10-325 MG PO TAB
1 | ORAL | 0 refills | Status: DC | PRN
Start: 2018-05-14 — End: 2018-05-16
  Administered 2018-05-14 – 2018-05-15 (×5): 1 via ORAL

## 2018-05-14 MED ORDER — ALBUTEROL SULFATE 90 MCG/ACTUATION IN HFAA
2 | RESPIRATORY_TRACT | 0 refills | Status: DC | PRN
Start: 2018-05-14 — End: 2018-05-15
  Administered 2018-05-14: 16:00:00 2 via RESPIRATORY_TRACT

## 2018-05-14 MED ORDER — LACTATED RINGERS IV SOLP
250 mL | INTRAVENOUS | 0 refills | Status: AC
Start: 2018-05-14 — End: ?
  Administered 2018-05-14: 12:00:00 250 mL via INTRAVENOUS

## 2018-05-14 MED ORDER — INSULIN GLARGINE 100 UNIT/ML (3 ML) SC INJ PEN
40 [IU] | Freq: Two times a day (BID) | SUBCUTANEOUS | 0 refills | Status: DC
Start: 2018-05-14 — End: 2018-05-16
  Administered 2018-05-14: 14:00:00 40 [IU] via SUBCUTANEOUS

## 2018-05-14 MED ORDER — LEVOTHYROXINE 25 MCG PO TAB
25 ug | Freq: Every day | ORAL | 0 refills | Status: DC
Start: 2018-05-14 — End: 2018-05-16
  Administered 2018-05-14 – 2018-05-15 (×2): 25 ug via ORAL

## 2018-05-14 MED ORDER — ALBUTEROL SULFATE 2.5 MG /3 ML (0.083 %) IN NEBU
2.5 mg | RESPIRATORY_TRACT | 0 refills | Status: DC | PRN
Start: 2018-05-14 — End: 2018-05-14

## 2018-05-14 MED ORDER — FUROSEMIDE 40 MG PO TAB
40 mg | Freq: Two times a day (BID) | ORAL | 0 refills | Status: DC
Start: 2018-05-14 — End: 2018-05-16
  Administered 2018-05-14 – 2018-05-15 (×3): 40 mg via ORAL

## 2018-05-14 MED ORDER — INSULIN ASPART 100 UNIT/ML SC FLEXPEN
0-6 [IU] | Freq: Before meals | SUBCUTANEOUS | 0 refills | Status: DC
Start: 2018-05-14 — End: 2018-05-14

## 2018-05-15 ENCOUNTER — Inpatient Hospital Stay: Admit: 2018-05-14 | Discharge: 2018-05-14 | Payer: MEDICARE

## 2018-05-15 ENCOUNTER — Encounter: Admit: 2018-05-13 | Discharge: 2018-05-13 | Payer: MEDICARE

## 2018-05-15 ENCOUNTER — Encounter
Admission: TF | Admit: 2018-05-13 | Discharge: 2018-05-15 | Disposition: A | Discharge status: Home / Self Care | Payer: MEDICARE | Source: Intra-hospital | Attending: Critical Care Medicine

## 2018-05-15 DIAGNOSIS — Z9981 Dependence on supplemental oxygen: Secondary | ICD-10-CM

## 2018-05-15 DIAGNOSIS — J449 Chronic obstructive pulmonary disease, unspecified: Secondary | ICD-10-CM

## 2018-05-15 DIAGNOSIS — G4733 Obstructive sleep apnea (adult) (pediatric): Secondary | ICD-10-CM

## 2018-05-15 DIAGNOSIS — Z87891 Personal history of nicotine dependence: Secondary | ICD-10-CM

## 2018-05-15 DIAGNOSIS — G893 Neoplasm related pain (acute) (chronic): Secondary | ICD-10-CM

## 2018-05-15 DIAGNOSIS — D519 Vitamin B12 deficiency anemia, unspecified: Secondary | ICD-10-CM

## 2018-05-15 DIAGNOSIS — N179 Acute kidney failure, unspecified: Secondary | ICD-10-CM

## 2018-05-15 DIAGNOSIS — I468 Cardiac arrest due to other underlying condition: Secondary | ICD-10-CM

## 2018-05-15 DIAGNOSIS — D72829 Elevated white blood cell count, unspecified: Secondary | ICD-10-CM

## 2018-05-15 DIAGNOSIS — E1165 Type 2 diabetes mellitus with hyperglycemia: Secondary | ICD-10-CM

## 2018-05-15 DIAGNOSIS — C7951 Secondary malignant neoplasm of bone: Secondary | ICD-10-CM

## 2018-05-15 DIAGNOSIS — T783XXA Angioneurotic edema, initial encounter: Secondary | ICD-10-CM

## 2018-05-15 DIAGNOSIS — J9601 Acute respiratory failure with hypoxia: Secondary | ICD-10-CM

## 2018-05-15 DIAGNOSIS — D509 Iron deficiency anemia, unspecified: Secondary | ICD-10-CM

## 2018-05-15 DIAGNOSIS — I700 Atherosclerosis of aorta: Secondary | ICD-10-CM

## 2018-05-15 DIAGNOSIS — T451X5A Adverse effect of antineoplastic and immunosuppressive drugs, initial encounter: Secondary | ICD-10-CM

## 2018-05-15 DIAGNOSIS — C61 Malignant neoplasm of prostate: Secondary | ICD-10-CM

## 2018-05-15 DIAGNOSIS — J9602 Acute respiratory failure with hypercapnia: Secondary | ICD-10-CM

## 2018-05-15 DIAGNOSIS — E039 Hypothyroidism, unspecified: Secondary | ICD-10-CM

## 2018-05-15 DIAGNOSIS — K219 Gastro-esophageal reflux disease without esophagitis: Secondary | ICD-10-CM

## 2018-05-15 DIAGNOSIS — I252 Old myocardial infarction: Secondary | ICD-10-CM

## 2018-05-15 DIAGNOSIS — I10 Essential (primary) hypertension: Secondary | ICD-10-CM

## 2018-05-15 DIAGNOSIS — I2510 Atherosclerotic heart disease of native coronary artery without angina pectoris: Secondary | ICD-10-CM

## 2018-05-15 DIAGNOSIS — T782XXA Anaphylactic shock, unspecified, initial encounter: Secondary | ICD-10-CM

## 2018-05-15 DIAGNOSIS — E872 Acidosis: Secondary | ICD-10-CM

## 2018-05-15 DIAGNOSIS — E785 Hyperlipidemia, unspecified: Secondary | ICD-10-CM

## 2018-05-15 DIAGNOSIS — Z8546 Personal history of malignant neoplasm of prostate: Secondary | ICD-10-CM

## 2018-05-15 DIAGNOSIS — Z955 Presence of coronary angioplasty implant and graft: Secondary | ICD-10-CM

## 2018-05-15 DIAGNOSIS — E1122 Type 2 diabetes mellitus with diabetic chronic kidney disease: Secondary | ICD-10-CM

## 2018-05-15 LAB — COMPREHENSIVE METABOLIC PANEL
Lab: 3.7 MMOL/L — ABNORMAL LOW (ref 3.5–5.1)
Lab: 6.1 g/dL — ABNORMAL LOW (ref 6.0–8.0)

## 2018-05-15 LAB — CBC AND DIFF
Lab: 4.4 K/UL — ABNORMAL LOW (ref 4.5–11.0)
Lab: 84 FL — ABNORMAL HIGH (ref >60)

## 2018-05-15 LAB — POC GLUCOSE
Lab: 331 mg/dL — ABNORMAL HIGH (ref 70–100)
Lab: 321 mg/dL — ABNORMAL HIGH (ref 70–100)
Lab: 191 mg/dL — ABNORMAL HIGH (ref 70–100)

## 2018-05-15 LAB — PHOSPHORUS: Lab: 4.3 mg/dL — ABNORMAL LOW (ref 2.0–4.5)

## 2018-05-15 MED ORDER — DIPHENHYDRAMINE HCL 25 MG PO CAP
25 mg | ORAL | 0 refills | Status: DC | PRN
Start: 2018-05-15 — End: 2018-05-16

## 2018-05-15 MED ORDER — RIVAROXABAN 20 MG PO TAB
20 mg | Freq: Every day | ORAL | 0 refills | Status: DC
Start: 2018-05-15 — End: 2018-05-16
  Administered 2018-05-15: 14:00:00 20 mg via ORAL

## 2018-05-15 MED ORDER — EPINEPHRINE 0.3 MG/0.3 ML IJ ATIN
INTRAMUSCULAR | 1 refills | 30.00000 days | Status: AC
Start: 2018-05-15 — End: 2018-05-15

## 2018-05-15 MED ORDER — TRIAMTERENE-HYDROCHLOROTHIAZID 37.5-25 MG PO TAB
1 | Freq: Every day | ORAL | 0 refills | Status: DC
Start: 2018-05-15 — End: 2018-05-16
  Administered 2018-05-15: 16:00:00 1 via ORAL

## 2018-05-15 MED ORDER — INSULIN ASPART 100 UNIT/ML SC FLEXPEN
0-24 [IU] | Freq: Every evening | SUBCUTANEOUS | 0 refills | Status: DC
Start: 2018-05-15 — End: 2018-05-16

## 2018-05-15 MED ORDER — POTASSIUM CHLORIDE 20 MEQ PO TBTQ
20 meq | Freq: Once | ORAL | 0 refills | Status: CP
Start: 2018-05-15 — End: ?
  Administered 2018-05-15: 20:00:00 20 meq via ORAL

## 2018-05-15 MED ORDER — LISINOPRIL 5 MG PO TAB
2.5 mg | Freq: Every day | ORAL | 0 refills | Status: DC
Start: 2018-05-15 — End: 2018-05-16
  Administered 2018-05-15: 16:00:00 2.5 mg via ORAL

## 2018-05-15 MED ORDER — PANTOPRAZOLE 40 MG PO TBEC
40 mg | Freq: Every day | ORAL | 0 refills | Status: DC
Start: 2018-05-15 — End: 2018-05-16
  Administered 2018-05-15: 14:00:00 40 mg via ORAL

## 2018-05-15 MED ORDER — CHOLECALCIFEROL (VITAMIN D3) 25 MCG (1,000 UNIT) PO TAB
1000 [IU] | Freq: Two times a day (BID) | ORAL | 0 refills | Status: DC
Start: 2018-05-15 — End: 2018-05-16
  Administered 2018-05-15: 14:00:00 1000 [IU] via ORAL

## 2018-05-15 MED ORDER — FERROUS SULFATE 325 MG (65 MG IRON) PO TAB
325 mg | Freq: Every day | ORAL | 0 refills | Status: DC
Start: 2018-05-15 — End: 2018-05-16
  Administered 2018-05-15: 14:00:00 325 mg via ORAL

## 2018-05-15 MED ORDER — ATORVASTATIN 40 MG PO TAB
80 mg | Freq: Every evening | ORAL | 0 refills | Status: DC
Start: 2018-05-15 — End: 2018-05-16

## 2018-05-15 MED ORDER — ASPIRIN 81 MG PO TBEC
81 mg | Freq: Every evening | ORAL | 0 refills | Status: DC
Start: 2018-05-15 — End: 2018-05-16

## 2018-05-15 MED ORDER — ALBUTEROL SULFATE 2.5 MG /3 ML (0.083 %) IN NEBU
2.5 mg | RESPIRATORY_TRACT | 0 refills | Status: DC | PRN
Start: 2018-05-15 — End: 2018-05-16

## 2018-05-15 MED ORDER — POTASSIUM CHLORIDE 20 MEQ PO TBTQ
20 meq | Freq: Once | ORAL | 0 refills | Status: CP
Start: 2018-05-15 — End: ?
  Administered 2018-05-15: 12:00:00 20 meq via ORAL

## 2018-05-15 MED ORDER — INSULIN ASPART 100 UNIT/ML SC FLEXPEN
14 [IU] | Freq: Three times a day (TID) | SUBCUTANEOUS | 0 refills | Status: DC
Start: 2018-05-15 — End: 2018-05-16

## 2018-05-15 MED ORDER — PREDNISONE 5 MG PO TAB
5 mg | Freq: Two times a day (BID) | ORAL | 0 refills | Status: DC
Start: 2018-05-15 — End: 2018-05-16
  Administered 2018-05-15: 14:00:00 5 mg via ORAL

## 2018-05-15 MED ORDER — EPINEPHRINE 0.3 MG/0.3 ML IJ ATIN
INTRAMUSCULAR | 1 refills | 30.00000 days | Status: AC
Start: 2018-05-15 — End: ?

## 2018-05-16 ENCOUNTER — Encounter: Admit: 2018-05-16 | Discharge: 2018-05-16 | Payer: MEDICARE

## 2018-05-16 ENCOUNTER — Ambulatory Visit: Admit: 2018-05-16 | Discharge: 2018-05-30 | Payer: MEDICARE

## 2018-05-18 ENCOUNTER — Encounter: Admit: 2018-05-18 | Discharge: 2018-05-18 | Payer: MEDICARE

## 2018-05-18 ENCOUNTER — Encounter: Admit: 2018-05-18 | Discharge: 2018-05-19 | Payer: MEDICARE

## 2018-05-18 DIAGNOSIS — R0602 Shortness of breath: ICD-10-CM

## 2018-05-18 MED ORDER — ALBUTEROL SULFATE 2.5 MG /3 ML (0.083 %) IN NEBU
2.5 mg | RESPIRATORY_TRACT | 0 refills | Status: DC | PRN
Start: 2018-05-18 — End: 2018-05-25

## 2018-05-18 MED ORDER — PATCH DOCUMENTATION - LIDOCAINE 5%
Freq: Two times a day (BID) | TRANSDERMAL | 0 refills | Status: DC
Start: 2018-05-18 — End: 2018-05-25

## 2018-05-18 MED ORDER — ENOXAPARIN 100 MG/ML SC SYRG
100 mg | Freq: Once | SUBCUTANEOUS | 0 refills | Status: CP
Start: 2018-05-18 — End: ?
  Administered 2018-05-19: 05:00:00 100 mg via SUBCUTANEOUS

## 2018-05-18 MED ORDER — ENOXAPARIN 100 MG/ML SC SYRG
100 mg | Freq: Two times a day (BID) | SUBCUTANEOUS | 0 refills | Status: DC
Start: 2018-05-18 — End: 2018-05-20
  Administered 2018-05-19 – 2018-05-20 (×3): 100 mg via SUBCUTANEOUS

## 2018-05-18 MED ORDER — VANCOMYCIN PHARMACY TO MANAGE
1 | 0 refills | Status: DC
Start: 2018-05-18 — End: 2018-05-21

## 2018-05-18 MED ORDER — ALBUTEROL SULFATE 2.5 MG /3 ML (0.083 %) IN NEBU
2.5 mg | Freq: Four times a day (QID) | RESPIRATORY_TRACT | 0 refills | Status: DC | PRN
Start: 2018-05-18 — End: 2018-05-19

## 2018-05-18 MED ORDER — INSULIN ASPART 100 UNIT/ML SC FLEXPEN
10 [IU] | Freq: Three times a day (TID) | SUBCUTANEOUS | 0 refills | Status: DC
Start: 2018-05-18 — End: 2018-05-19
  Administered 2018-05-19: 15:00:00 10 [IU] via SUBCUTANEOUS

## 2018-05-18 MED ORDER — RIVAROXABAN 20 MG PO TAB
20 mg | Freq: Every day | ORAL | 0 refills | Status: DC
Start: 2018-05-18 — End: 2018-05-19

## 2018-05-18 MED ORDER — LEVOTHYROXINE 25 MCG PO TAB
25 ug | Freq: Every day | ORAL | 0 refills | Status: DC
Start: 2018-05-18 — End: 2018-05-25
  Administered 2018-05-19 – 2018-05-25 (×7): 25 ug via ORAL

## 2018-05-18 MED ORDER — PANTOPRAZOLE 40 MG PO TBEC
40 mg | Freq: Every day | ORAL | 0 refills | Status: DC
Start: 2018-05-18 — End: 2018-05-25
  Administered 2018-05-19 – 2018-05-25 (×7): 40 mg via ORAL

## 2018-05-18 MED ORDER — INSULIN ASPART 100 UNIT/ML SC FLEXPEN
0-6 [IU] | Freq: Before meals | SUBCUTANEOUS | 0 refills | Status: DC
Start: 2018-05-18 — End: 2018-05-19
  Administered 2018-05-19: 05:00:00 3 [IU] via SUBCUTANEOUS

## 2018-05-18 MED ORDER — SERTRALINE 50 MG PO TAB
50 mg | Freq: Every day | ORAL | 0 refills | Status: DC
Start: 2018-05-18 — End: 2018-05-25
  Administered 2018-05-19 – 2018-05-25 (×7): 50 mg via ORAL

## 2018-05-18 MED ORDER — ATORVASTATIN 40 MG PO TAB
80 mg | Freq: Every evening | ORAL | 0 refills | Status: DC
Start: 2018-05-18 — End: 2018-05-25
  Administered 2018-05-19 – 2018-05-25 (×7): 80 mg via ORAL

## 2018-05-18 MED ORDER — VANCOMYCIN 1,500 MG IVPB
15 mg/kg | Freq: Two times a day (BID) | INTRAVENOUS | 0 refills | Status: DC
Start: 2018-05-18 — End: 2018-05-21
  Administered 2018-05-19 – 2018-05-21 (×10): 1500 mg via INTRAVENOUS

## 2018-05-18 MED ORDER — HYDRALAZINE 20 MG/ML IJ SOLN
10 mg | INTRAVENOUS | 0 refills | Status: DC | PRN
Start: 2018-05-18 — End: 2018-05-25
  Administered 2018-05-23: 10:00:00 10 mg via INTRAVENOUS

## 2018-05-18 MED ORDER — ACETAMINOPHEN 325 MG PO TAB
650 mg | ORAL | 0 refills | Status: DC | PRN
Start: 2018-05-18 — End: 2018-05-25

## 2018-05-18 MED ORDER — DOCUSATE SODIUM 100 MG PO CAP
100 mg | Freq: Every day | ORAL | 0 refills | Status: DC | PRN
Start: 2018-05-18 — End: 2018-05-25

## 2018-05-18 MED ORDER — SENNOSIDES 8.6 MG PO TAB
2 | Freq: Every evening | ORAL | 0 refills | Status: DC | PRN
Start: 2018-05-18 — End: 2018-05-25

## 2018-05-18 MED ORDER — MELATONIN 3 MG PO TAB
3 mg | Freq: Every evening | ORAL | 0 refills | Status: DC | PRN
Start: 2018-05-18 — End: 2018-05-25

## 2018-05-18 MED ORDER — MORPHINE 15 MG PO TBER
15 mg | Freq: Every evening | ORAL | 0 refills | Status: DC | PRN
Start: 2018-05-18 — End: 2018-05-19
  Administered 2018-05-19 (×2): 15 mg via ORAL

## 2018-05-18 MED ORDER — LIDOCAINE 5 % TP PTMD
2 | Freq: Every day | TOPICAL | 0 refills | Status: DC
Start: 2018-05-18 — End: 2018-05-25
  Administered 2018-05-19: 15:00:00 1 via TOPICAL

## 2018-05-18 MED ORDER — ASPIRIN 81 MG PO TBEC
81 mg | Freq: Every evening | ORAL | 0 refills | Status: DC
Start: 2018-05-18 — End: 2018-05-25
  Administered 2018-05-19 – 2018-05-25 (×7): 81 mg via ORAL

## 2018-05-18 MED ORDER — FERROUS SULFATE 325 MG (65 MG IRON) PO TAB
325 mg | Freq: Two times a day (BID) | ORAL | 0 refills | Status: DC
Start: 2018-05-18 — End: 2018-05-25
  Administered 2018-05-19 – 2018-05-25 (×13): 325 mg via ORAL

## 2018-05-18 MED ORDER — CARVEDILOL 3.125 MG PO TAB
3.125 mg | Freq: Two times a day (BID) | ORAL | 0 refills | Status: DC
Start: 2018-05-18 — End: 2018-05-25
  Administered 2018-05-19 – 2018-05-25 (×14): 3.125 mg via ORAL

## 2018-05-18 MED ORDER — CEFEPIME 1G/100ML NS IVPB (MB+)
1 g | INTRAVENOUS | 0 refills | Status: DC
Start: 2018-05-18 — End: 2018-05-19
  Administered 2018-05-19 (×4): 1 g via INTRAVENOUS

## 2018-05-18 MED ORDER — INSULIN GLARGINE 100 UNIT/ML (3 ML) SC INJ PEN
50 [IU] | Freq: Every evening | SUBCUTANEOUS | 0 refills | Status: DC
Start: 2018-05-18 — End: 2018-05-20
  Administered 2018-05-19: 05:00:00 50 [IU] via SUBCUTANEOUS

## 2018-05-18 MED ORDER — METHYLPREDNISOLONE SOD SUC(PF) 125 MG/2 ML IJ SOLR
62.5 mg | INTRAVENOUS | 0 refills | Status: DC
Start: 2018-05-18 — End: 2018-05-21
  Administered 2018-05-19 – 2018-05-21 (×9): 62.5 mg via INTRAVENOUS

## 2018-05-19 ENCOUNTER — Inpatient Hospital Stay
Admit: 2018-05-19 | Discharge: 2018-05-25 | Disposition: A | Discharge status: Home / Self Care | Payer: MEDICARE | Source: Other Acute Inpatient Hospital

## 2018-05-19 MED ORDER — INSULIN ASPART 100 UNIT/ML SC FLEXPEN
12 [IU] | Freq: Three times a day (TID) | SUBCUTANEOUS | 0 refills | Status: DC
Start: 2018-05-19 — End: 2018-05-25

## 2018-05-19 MED ORDER — INSULIN ASPART 100 UNIT/ML SC FLEXPEN
0-12 [IU] | Freq: Before meals | SUBCUTANEOUS | 0 refills | Status: DC
Start: 2018-05-19 — End: 2018-05-23

## 2018-05-19 MED ORDER — HYDROCODONE-ACETAMINOPHEN 10-325 MG PO TAB
1 | ORAL | 0 refills | Status: DC | PRN
Start: 2018-05-19 — End: 2018-05-25
  Administered 2018-05-19 – 2018-05-25 (×13): 1 via ORAL

## 2018-05-19 MED ORDER — CEFEPIME 2G/100ML NS IVPB (MB+)
2 g | INTRAVENOUS | 0 refills | Status: DC
Start: 2018-05-19 — End: 2018-05-21
  Administered 2018-05-19 – 2018-05-21 (×14): 2 g via INTRAVENOUS

## 2018-05-19 NOTE — Progress Notes
At 2130 on 05/18/18 pt had 6 seconds of Vtach.  Pt is asymptomatic and was having blood gases drawn at the time with RT.  Notified Dr. Marlou Starks on [facility] txt and in person.  Physician acknowledged and she did not give any additional orders.    At 2340 on 05/18/18 I notified Dr Alonna Minium that I was able to obtain all the labs and one set of blood cultures but not the second set.  Physician acknowledged on [facility] txt with no further orders.    Pt is resting and will continue to monitor.

## 2018-05-19 NOTE — Drug Level
Pharmacy Vancomycin Note  Subjective:   Alexander Holland is a 68 y.o. male being treated for HCAP.    Objective:     Current Vancomycin Orders   Medication Dose Route Frequency   ??? vancomycin (VANCOCIN) 1,500 mg in sodium chloride 0.9% (NS) IVPB  15 mg/kg Intravenous Q12H*    And   ??? vancomycin, pharmacy to manage  1 each Service Per Pharmacy     Start Date of  Vancomycin therapy: 05/19/2018  Additional Abx: cefepime  Cultures: 2/3 blood NGTD  White Blood Cells   Date/Time Value Ref Range Status   05/19/2018 0530 5.2 4.5 - 11.0 K/UL Final   05/18/2018 2210 5.6 4.5 - 11.0 K/UL Final     Creatinine   Date/Time Value Ref Range Status   05/19/2018 0530 1.03 0.4 - 1.24 MG/DL Final   45/40/9811 9147 1.00 0.4 - 1.24 MG/DL Final     Blood Urea Nitrogen   Date/Time Value Ref Range Status   05/19/2018 0530 22 7 - 25 MG/DL Final     Estimated CrCl:  80    Assessment:   Target levels for this patient: AUC 500, trough 10-20    Plan:   1. Vancomycin 1500mg  IV q12h.  2. Next scheduled level(s): AUC evaluation at steady state  3. Pharmacy will continue to monitor and adjust therapy as needed.    Marrion Coy, PHARMD  05/19/2018

## 2018-05-19 NOTE — Progress Notes
RT Adult Assessment Note    NAME:Alexander Holland             MRN: [id number]             DOB:03/22/1951          AGE: 68 y.o.  ADMISSION DATE: 05/18/2018             DAYS ADMITTED: LOS: 0 days    RT Treatment Plan:  Protocol Plan: Medications  Albuterol: Neb PRN    Protocol Plan: Procedures  Oxygen/Humidity: O2 to keep SpO2 > 92%  Monitoring: Pulse oximetry BID & PRN    Additional Comments:  Impressions of the patient: A little SOA  Intervention(s)/outcome(s):   Patient education that was completed:  Recommendations to the care team:     Vital Signs:  Pulse: 102  RR: 18 PER MINUTE  SpO2: 96 %  O2 Device: Cannula  Liter Flow: 3 Lpm  O2%:    Breath Sounds:    Respiratory Effort: SOA (Short of Air)

## 2018-05-19 NOTE — Consults
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)  High volume disease with bone and visceral metastasis  9/17 Transurethral resection of prostate: Pathology revealed adenocarcinoma of the prostate  10/17 MRI pelvis and bon scan showed : Extension of prostate tumor into locat tissue and with Widespread osseous metastases in axial and appendicular skeleton  Has multiple line of treatment bicalutamide, lupron, Docetaxel and cabazitaxel and abiraterone  Recent CT showed progress mainly in the bone lesion   He has been following with his oncologist Dr.Wulff-Burchfield at [facility]  Enrolled in Raub 2 study  and randomized to abiraterone/prednisone plus cabazitaxel arm-  had completed 6 cycles  Had worsening bony pain and radiation oncology consulted for palliative  radiation   Slowey rising PSA Level recent 04/20/18(1.95), however, it is castrated resistance   MRI lumber Multiple lumbar spine osseous metastase  MRI Pelvis Sacral and bilateral ilial metastases. The largest metastasis involves   the central left ilium, SI joint, and lateral sacrum measuring at least   5.9 cm. This is destructive with some extraosseous extension across the   Cortex.  On pain medication - PTA Morphine SR 15mg  QHS???&???Norco q3hr PRN   Admitted for worsening SOB in the recent history of anaphylactic shock possibly change MRI contrast    He has progressive castrated resistant metastatic prostate cancer (mainly bone metastasis).  There is clear plan of management for his cancer by ??? ABDOMEN SURGERY      Umbilical Hernia   ??? HX HEART CATHETERIZATION     ??? LEFT HEART CATHETERIZATION      s/p PCI ~1995        SOCIAL HISTORY:  Social History     Socioeconomic History   ??? Marital status: Married     Spouse name: Not on file   ??? Number of children: Not on file   ??? Years of education: Not on file   ??? Highest education level: Not on file   Occupational History   ??? Not on file   Tobacco Use   ??? Smoking status: Former Smoker     Packs/day: 1.00     Years: 30.00     Pack years: 30.00     Types: Cigarettes     Last attempt to quit: 02/01/1996     Years since quitting: 22.3   ??? Smokeless tobacco: Never Used   Substance and Sexual Activity   ??? Alcohol use: Yes     Comment: rarely   ??? Drug use: No   ??? Sexual activity: Not on file   Other Topics Concern   ??? Not on file   Social History Narrative   ??? Not on file        FAMILY HISTORY:  No family history on file.     IMMUNIZATIONS:  Immunization History   Administered Date(s) Administered   ??? Pneumococcal Vaccine (23-Val Adult) 02/09/2011           ALLERGIES:  Gadolinium-containing contrast media and Multihance [gadobenate dimeglumine]    HOME MEDICATIONS:  Medications Prior to Admission   Medication Sig   ??? abiraterone (ZYTIGA) 500 mg tablet Take 1,000 mg by mouth daily. Take on an empty stomach, at least 2 hours before or after food.  Do not crush or chew.   ??? ACCU-CHEK SOFTCLIX LANCETS MISC Use  as directed twice daily.     ??? albuterol (VENTOLIN HFA, PROAIR HFA) 90 mcg/actuation inhaler Inhale 2 Puffs by mouth every 6 hours as needed.     ??? albuterol 0.083% (PROVENTIL; VENTOLIN) 2.5 mg /3 mL (0.083 %) nebulizer solution Inhale 3 mL solution by nebulizer as directed every 4 hours as needed for Wheezing or Shortness of Breath.   ??? aspirin EC 81 mg tablet Take 81 mg by mouth at bedtime daily. Take with food.   ??? atorvastatin (LIPITOR) 80 mg tablet Take 80 mg by mouth at bedtime daily. ??? Lungs: clear to ausculation bilaterally, no wheezes/rhonchi/crackles  ??? Abdomen: soft, non-tender, non-distended, normo-active bowel sounds  ??? Hepatosplenomegaly: No/Yes  ??? Extremities: no edema, pedal pulses 2 (+) bilaterally  ??? Neuro: no focal deficits  ??? Skin: no rashes    LABS:  Recent CBC   Recent Labs     05/18/18  2210 05/19/18  0530   WBC 5.6 5.2   HGB 8.5* 8.1*   HCT 27.3* 26.5*   PLTCT 100* 111*   MCV 84.9 85.7   INR 1.3*  --         Recent CMP   Recent Labs     05/18/18  2210 05/19/18  0530   NA 137 135*   K 3.8 4.7   CL 94* 96*   CO2 35* 35*   GAP 8 4   CR 1.00 1.03   GFR >60 >60   GLU 305* 361*   CA 8.2* 8.3*   MG 1.9  --    PO4 2.6  --    ALBUMIN 3.2*  --    ALKPHOS 352*  --    AST 31  --    ALT 21  --    TOTBILI 1.4*  --           RADIOLOGY:  Pertinent radiology reviewed.

## 2018-05-20 ENCOUNTER — Encounter: Admit: 2018-05-20 | Discharge: 2018-05-20 | Payer: MEDICARE

## 2018-05-20 MED ORDER — INSULIN GLARGINE 100 UNIT/ML (3 ML) SC INJ PEN
70 [IU] | Freq: Every evening | SUBCUTANEOUS | 0 refills | Status: DC
Start: 2018-05-20 — End: 2018-05-24
  Administered 2018-05-23: 04:00:00 70 [IU] via SUBCUTANEOUS

## 2018-05-20 MED ORDER — INSULIN GLARGINE 100 UNIT/ML (3 ML) SC INJ PEN
20 [IU] | SUBCUTANEOUS | 0 refills | Status: DC
Start: 2018-05-20 — End: 2018-05-20

## 2018-05-20 MED ORDER — INSULIN GLARGINE 100 UNIT/ML (3 ML) SC INJ PEN
40 [IU] | Freq: Two times a day (BID) | SUBCUTANEOUS | 0 refills | Status: DC
Start: 2018-05-20 — End: 2018-05-20

## 2018-05-20 MED ORDER — RIVAROXABAN 20 MG PO TAB
20 mg | Freq: Every day | ORAL | 0 refills | Status: DC
Start: 2018-05-20 — End: 2018-05-25
  Administered 2018-05-20 – 2018-05-24 (×5): 20 mg via ORAL

## 2018-05-20 NOTE — Consults
.  Pulmonary and Critical Care Medicine Consult      Admission Date: 05/18/2018                                                LOS: 1 day    Reason for Pulmonary Consult:  hypercarbia    Active Problems:    Hypercapnia      Impression:   Alexander Holland  is a 68 y.o. male with co-morbidities of: 1) metastatic prostate carcinoma: 2) CAD s/p CABG; 3)  CHF with recent echo demonstrating EF 4-45%; 4) morbid obesity who was transferred to [facility] after recent anaphylactic episode during MRI with respiratory/cardiac arrest (requiring intubation and chest compressions) secondary to contrast.  He has known diagnosis of OSA and is adherent to CPAP therapy, but not undergone retitration for years per patient wife.      Issues of acute on chronic respiratory failure with hypoxia and hypercarbia.  ABG is consistent with a compensated respiratory acidosis.  Clinical suspicion is that CPAP pressure too low (patient with CPAP for > 5 years and he and wife cannot recall a retitration over that time) coupled with the respiratory depressive effects of his pain medication regimen with resultant hypercarbia.  Patient also relatively sedentary and with atelectasis on chest Xray.        Recommendation:  1) Contact respiratory therapy to titrate CPAP - may need to change to bipap to find pressure that will correct CO2 level on AM ABG    2) Minimize sedating pain medications as much as possible    3) Incentive spirometer and encourage to use 5 times per hour while awake    4) albuterol nmt q 4 hours while awake    5) humidify nasal cannula oxygen    6) We will follow      ______________________________________________________________________    History of Present Illness:     Alexander Holland  is a 68 y.o. male with a history of metastatic prostate cancer who, while undergoing MRI, had apparent anaphylactic reaction and respiratory arrest necessitating intubation.  Co-morbidities ??? aspirin EC 81 mg tablet Take 81 mg by mouth at bedtime daily. Take with food.   05/13/2018   ??? atorvastatin (LIPITOR) 80 mg tablet Take 80 mg by mouth at bedtime daily.   05/12/2018   ??? blood sugar diagnostic (ACCU-CHEK AVIVA PLUS) test strip 1 Strip by Test route before meals and at bedtime.     Unknown   ??? carvedilol (COREG) 3.125 mg tablet Take one tablet by mouth twice daily with meals. Take with food. 60 tablet 11 05/13/2018   ??? cholecalciferol (VITAMIN D-3) 1,000 units tablet Take 1,000 Units by mouth twice daily.   05/13/2018   ??? EPINEPHrine (EPIPEN) 1 mg/mL injection pen (2-Pack) Inject 0.3 mg (1 Pen) into thigh if needed for anaphylactic reaction. May repeat in 5-15 minutes if needed.  Indications: a significant type of allergic reaction called anaphylaxis 2 each 1    ??? ferrous sulfate 325 mg (65 mg iron) tablet Take one tablet by mouth twice daily with meals.   05/13/2018   ??? furosemide (LASIX) 40 mg tablet Take one tablet by mouth twice daily. Morning and afternoon 180 tablet 3 05/13/2018   ??? HYDROcodone/acetaminophen (NORCO) 10/325 mg tablet Take one tablet by mouth every 3 hours as needed for Pain  Can cause  constipation, consider adding Senna as a laxative 120 tablet 0 Taking   ??? insulin aspart (NOVOLOG) 100 unit/mL flexPEN Inject 10 Units into area(s) as directed three times daily with meals. (Patient taking differently: Inject 21 Units under the skin twice daily. In the morning with breakfast and evening with supper) 3 box 0 05/13/2018   ??? insulin glargine (LANTUS SOLOSTAR) 100 unit/mL (3 mL) injection PEN Inject 68 Units into area(s) as directed at bedtime daily. (Patient taking differently: Inject 81 Units under the skin at bedtime daily.) 3 box 0 05/12/2018   ??? levothyroxine (SYNTHROID) 25 mcg tablet Take 25 mcg by mouth daily. Before breakfast    05/13/2018   ??? lisinopril (PRINIVIL, ZESTRIL) 2.5 mg tablet Take 1 tablet by mouth daily. 90 tablet 3 05/13/2018 insulin glargine (LANTUS SOLOSTAR) injection PEN 50 Units, 50 Units, Subcutaneous, QHS  levothyroxine (SYNTHROID) tablet 25 mcg, 25 mcg, Oral, QDAY  lidocaine (LIDODERM) 5 % topical patch 2 patch, 2 patch, Topical, QDAY    And  Verification of Patch Placement and Integrity - Lidocaine 5%, , Transdermal, BID  methylPREDNISolone (SOLU-MEDROL PF) injection 62.5 mg, 62.5 mg, Intravenous, Q8H  pantoprazole DR (PROTONIX) tablet 40 mg, 40 mg, Oral, QDAY(21)  sertraline (ZOLOFT) tablet 50 mg, 50 mg, Oral, QDAY  vancomycin (VANCOCIN) 1,500 mg in sodium chloride 0.9% (NS) IVPB, 15 mg/kg, Intravenous, Q12H*    Continuous Infusions:  PRN and Respiratory Meds:acetaminophen Q6H PRN, albuterol 0.083% PRN, docusate QDAY PRN, hydrALAZINE Q6H PRN, HYDROcodone/acetaminophen Q4H PRN, melatonin QHS PRN, senna QHS PRN, vancomycin (VANCOCIN) IVPB Q12H* **AND** vancomycin, pharmacy to manage Per Pharmacy          Vital Signs:  Last Filed in 24 hours Vital Signs:  24 hour Range    BP: 137/71 (02/04 1908)  Temp: 36.4 ???C (97.5 ???F) (02/04 1908)  Pulse: 80 (02/04 1908)  Respirations: 18 PER MINUTE (02/04 1908)  SpO2: 98 % (02/04 1908)  Height: 170.2 cm (67) (02/03 2200) BP: (109-146)/(58-71)   Temp:  [36.4 ???C (97.5 ???F)-37.6 ???C (99.6 ???F)]   Pulse:  [75-96]   Respirations:  [18 PER MINUTE-20 PER MINUTE]   SpO2:  [93 %-100 %]      Physical Exam:  Gen: AAOx3, NAD; slow to answer; wearing NC oxygen; cannot speak in full sentences  HEENT:  NCAT, EOMI, PERRL, No oral lesions; mouth dry  RESP: Decreased breath sounds throughout; intermittent rhonchi that clear with cough  CVS: RRR, No RGM, No JVD,   ABD: Protuberant, Soft, NT, ND, BS+  Ext: No Rashes, trace Edema  Neuro: No gross defecits, strength 5/5, non-focal  Psych: Mood stable   Skin: no rashes on exposed skin    Lab:  Recent Labs     05/18/18  2210 05/19/18  0530   NA 137 135*   K 3.8 4.7   CL 94* 96*   CO2 35* 35*   BUN 22 22   CR 1.00 1.03   GLU 305* 361*   GAP 8 4   GFR >60 >60 MG 1.9  --    CA 8.2* 8.3*   PO4 2.6  --      Recent Labs     05/18/18  2210   ALKPHOS 352*   AST 31   ALT 21   TOTPROT 5.8*   TOTBILI 1.4*   ALBUMIN 3.2*     Recent Labs     05/18/18  2210 05/19/18  0530   HGB 8.5* 8.1*   HCT 27.3* 26.5*  WBC 5.6 5.2   PLTCT 100* 111*   INR 1.3*  --      Recent Labs     05/18/18  2145   PHART 7.42   PCO2A 52*   PO2ART 103*   HCO3A 31.5*   O2SATACAL 97.9       Radiology and other diagnostic tests:  Chest Xray 05/18/18  Sternotomy wires and mediastinal clips are again noted. The heart remains   mildly enlarged with mild pulmonary vascular congestion and interstitial   edema noted. Scattered areas of scarring are again noted. There are small   bilateral pleural effusions. No pneumothorax is identified.CXray 05/18/18    CT chest April 20, 2018  1. ???No thoracic lymphadenopathy or pulmonary metastatic disease.   2. ???Multifocal osseous metastatic disease. Please see the separately   dictated same-day bone scan for full discussion of osseous findings.    ABG 05/18/18  PH 7.42/pCO2 52/pO2 103    Jessup Ogas Tery Sanfilippo, MD  Pulmonary

## 2018-05-20 NOTE — Progress Notes
PHYSICAL THERAPY  ASSESSMENT      Name: Alexander Holland            MRN: [id number]                DOB: Sep 25, 1950          Age: 68 y.o.  Admission Date: 05/18/2018             LOS: 2 days      Mobility  Patient Turn/Position: Weight shifted (Bed)(just worked with PT)  Progressive Mobility Level: Walk in room  Distance Walked (feet): 40 ft  Level of Assistance: Stand by assistance  Assistive Device: Walker  Time Tolerated: 11-30 minutes  Activity Limited By: Shortness of air    Subjective  Significant hospital events: Physician: 68 yo male with a history of stage IV Prostate Cancer, CAD, HFrEF, diabetes, COPD, recent cardiac arrest after an anaphylactic reaction, pt was discharged home on 1/31 after that hospitalization. Now directly admitted for worsening sob since yesterday.  Mental / Cognitive Status: Alert;Oriented;Cooperative;Follows Commands  Pain: Patient has no complaint of pain  Pain Interventions: Patient agrees to participate in therapy;Patient agrees to participate in therapy with modifications to session  Comments: on 3 L 02 during therapy session. 02 remained in 90s during entire therapy session until pt laid back into bed, then 02 dropped to 70s. After about 30 seconds pt able to recover back to high 80s  Ambulation Assist: Independent Mobility in [employer] with Device  Patient Owned Equipment: Single [employer] Situation: Lives with Family  Type of Home: House  Entry Stairs: 1-2 Stairs  In-Home Stairs: Able to Live on One Level  Comments: no recent falls. on 2 L 02 at rest 4 L 02 during activity prior to admission.     ROM  ROM Position Assessed: Seated  ROM Method: Active  LE ROM: WFL    Strength  Strength Position Assessed: Seated  Overall Strength: WFL    Bed Mobility/Transfer  Bed Mobility: Sit to Supine: Standby Assist;Verbal Cues;Requires Extra Time;Safety Considerations  Comments: Pt sitting on edge of bed upon arrival.   Transfer Type: Sit to/from Stand Patient Currently Requires Physical Assist With: Stairs;Ambulation  Patient Currently Requires Equipment: Owns what is needed    Therapist  Romana Juniper, PT  Date  05/20/2018

## 2018-05-21 ENCOUNTER — Encounter: Admit: 2018-05-21 | Discharge: 2018-05-21 | Payer: MEDICARE

## 2018-05-21 MED ORDER — PREDNISONE 20 MG PO TAB
40 mg | Freq: Every day | ORAL | 0 refills | Status: CP
Start: 2018-05-21 — End: ?
  Administered 2018-05-22 – 2018-05-23 (×2): 40 mg via ORAL

## 2018-05-21 MED ORDER — VANCOMYCIN PHARMACY TO MANAGE
1 | 0 refills | Status: DC
Start: 2018-05-21 — End: 2018-05-21

## 2018-05-21 MED ORDER — VANCOMYCIN 1G/250ML D5W IVPB (VIAL2BAG)
1000 mg | Freq: Two times a day (BID) | INTRAVENOUS | 0 refills | Status: DC
Start: 2018-05-21 — End: 2018-05-21

## 2018-05-21 MED ORDER — AMOXICILLIN-POT CLAVULANATE 875-125 MG PO TAB
875 mg | Freq: Two times a day (BID) | ORAL | 0 refills | Status: DC
Start: 2018-05-21 — End: 2018-05-25
  Administered 2018-05-22 – 2018-05-25 (×8): 875 mg via ORAL

## 2018-05-21 NOTE — Progress Notes
- pt denies any bleeding on admission  ???- started also solumedrol, and empiric abx on admission - de-escalate today, stopped Vanc/Cefepime and started Augmentin, stopped Solu-Medrol and started prednisone 40 mg po daily  -on admission did not order CTA chest (2/2 contrast allergy) or V/Q scan (pt has COPD, so likely unreliable) since patient anyway is supposed on be on anticoagulation and will not change management, will defer further specifics to onco/pulmonology  -Reports breathing improved, continue current management  -Check Overnight oximetry, will check exercise oximetry prior to discharge  ???  Depression  Acute on Chronic Pain  - persistent pain on chest from CPR  - Chronic pain described as constant aching worse when he lays down to sleep at night. Per family pain has been present since cancer dx.   - PTA Morphine SR 15mg  QHS???&???Norco q3hr PRN   PLAN  - Restarted PTA morphine SR on admission, will hold and place on Norco prn-PTA med  - consider PRN Fentanyl for breakthrough  - added Lidoderm patches  - Resume PTA Zoloft ???  ???  Recent anaphylactic shock and cardiac arrest ???  - 1/29 Cardiac Arrest suspected secondary to anaphylaxis from MRI contrast.   - C/o chest pain but states its around bruised ares from CPR.   - Replace electrolytes for a goal K+ >4.0 & Mag+ >2.0.  -???hold PTA prednisone-on steroids as above  ???  HFpEF  CAD s/p CABG 2012  HTN & HLD  - CAD s/p stent ~69yrs ago???  -???CABG in 2012 through [facility] service.   - PTA Lisinopril, Carvedilol, Lasix &???Lipitor.   - PTA ASA 81mg  & [place] Ramp   ???  DM  Hypothyroidism  - PTA Synthroid - continue  -???Adjust insulin: 50 QHS, 10 TID, LDCF, change to MDCF, and 12 units TID novolog, increase Lantus to 70 units QHS ( home dose 81 units, currently on 50 units qhs)   ???  Iron Deficiency Anemia???  - Baseline Hgb ~ 8-9  - PTA Ferrous Sulfate & B12 1000mg   ???  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 Lura Em with [facility] Results for orders placed or performed during the hospital encounter of 05/18/18 (from the past 24 hour(s))   POC GLUCOSE    Collection Time: 05/20/18  1:50 PM   Result Value Ref Range    Glucose, POC 275 (H) 70 - 100 MG/DL   POC GLUCOSE    Collection Time: 05/20/18  5:37 PM   Result Value Ref Range    Glucose, POC 279 (H) 70 - 100 MG/DL   VANCOMYCIN TROUGH    Collection Time: 05/20/18 11:50 PM   Result Value Ref Range    Vancomycin Trough 23.6 (H) 10.0 - 20.0 MCG/ML   POC GLUCOSE    Collection Time: 05/20/18 11:52 PM   Result Value Ref Range    Glucose, POC 277 (H) 70 - 100 MG/DL   CBC AND DIFF    Collection Time: 05/21/18  5:32 AM   Result Value Ref Range    White Blood Cells 4.7 4.5 - 11.0 K/UL    RBC 2.80 (L) 4.4 - 5.5 M/UL    Hemoglobin 7.3 (L) 13.5 - 16.5 GM/DL    Hematocrit 16.1 (L) 40 - 50 %    MCV 85.4 80 - 100 FL    MCH 26.2 26 - 34 PG    MCHC 30.7 (L) 32.0 - 36.0 G/DL    RDW 09.6 (H) 11 -  15 %    Platelet Count 117 (L) 150 - 400 K/UL    MPV 7.9 7 - 11 FL    Neutrophils 84 (H) 41 - 77 %    Lymphocytes 5 (L) 24 - 44 %    Monocytes 11 4 - 12 %    Eosinophils 0 0 - 5 %    Basophils 0 0 - 2 %    Absolute Neutrophil Count 3.90 1.8 - 7.0 K/UL    Absolute Lymph Count 0.20 (L) 1.0 - 4.8 K/UL    Absolute Monocyte Count 0.50 0 - 0.80 K/UL    Absolute Eosinophil Count 0.00 0 - 0.45 K/UL    Absolute Basophil Count 0.00 0 - 0.20 K/UL   BASIC METABOLIC PANEL    Collection Time: 05/21/18  5:32 AM   Result Value Ref Range    Sodium 140 137 - 147 MMOL/L    Potassium 4.3 3.5 - 5.1 MMOL/L    Chloride 102 98 - 110 MMOL/L    CO2 34 (H) 21 - 30 MMOL/L    Anion Gap 4 3 - 12    Glucose 208 (H) 70 - 100 MG/DL    Blood Urea Nitrogen 30 (H) 7 - 25 MG/DL    Creatinine 4.54 0.4 - 1.24 MG/DL    Calcium 8.8 8.5 - 09.8 MG/DL    eGFR Non African American >60 >60 mL/min    eGFR African American >60 >60 mL/min   VANCOMYCIN 2HR POST DOSE    Collection Time: 05/21/18  5:32 AM   Result Value Ref Range    Vancomycin 2HR POST Dose 34.2 ug/mL

## 2018-05-21 NOTE — Progress Notes
Patient to Radiation Oncology at 10:03 for 10:15 appointment.

## 2018-05-21 NOTE — Progress Notes
- please obtain noc ox on Auto-CPAP to assess for adequacy of current therapy    We will continue to follow. Above plan discussed with attending physician, Dr. Virl Cagey.    Marylynn Pearson, APRN-NP  Pager [phone]  ________________________________________________________________________    Subjective  Poor sleep overnight and tired this AM. Reports shortness of breath is at baseline and denies any cough or sputum production. Follows at the [place] but is unable to provide physician name or if he sees a pulmonologist.   Family to bring CPAP today.     Review of Systems   Constitutional: Positive for malaise/fatigue.   Respiratory: Positive for shortness of breath. Negative for cough and sputum production.    Gastrointestinal: Negative for diarrhea, nausea and vomiting.   All other systems reviewed and are negative.        Objective  Vital Signs: Last Filed Vital Signs: 24 Hour Range   BP: 151/88 (02/06 0750)  Temp: 36.3 ???C (97.4 ???F) (02/06 0750)  Pulse: 92 (02/06 0750)  Respirations: 20 PER MINUTE (02/06 0750)  SpO2: 94 % (02/06 0803) BP: (121-153)/(55-90)   Temp:  [36.3 ???C (97.4 ???F)-36.9 ???C (98.4 ???F)]   Pulse:  [68-92]   Respirations:  [18 PER MINUTE-20 PER MINUTE]   SpO2:  [94 %-97 %]        Intake/Output Summary:  (Last 24 hours)    Intake/Output Summary (Last 24 hours) at 05/21/2018 0846  Last data filed at 05/21/2018 0625  Gross per 24 hour   Intake 1450 ml   Output 1700 ml   Net -250 ml       General: Awake, in no acute distress. Obese  HEENT: NC/AT, sclera non-icteric, moist mucus membranes.  CV: regular rhythm, normal rate, no murmurs.  Lungs: diminished bilateral bases  Abdomen: soft, non-tender, non-distended, normo-active bowel sounds  Extremities: no edema, pedal pulses 2 (+) bilaterally  Neuro: no focal deficits  Skin: no rashes    Lab Review  Recent CBC   Recent Labs     05/18/18  2210 05/19/18  0530 05/20/18  0536 05/21/18  0532   WBC 5.6 5.2 4.0* 4.7   HGB 8.5* 8.1* 8.3* 7.3*   HCT 27.3* 26.5* 26.9* 23.9* PLTCT 100* 111* 114* 117*   MCV 84.9 85.7 85.9 85.4   INR 1.3*  --   --   --         Recent CMP   Recent Labs     05/18/18  2210 05/19/18  0530 05/20/18  0536 05/21/18  0532   NA 137 135* 136* 140   K 3.8 4.7 4.7 4.3   CL 94* 96* 98 102   CO2 35* 35* 33* 34*   GAP 8 4 5 4    BUN 22 22 30* 30*   CR 1.00 1.03 1.01 1.02   GFR >60 >60 >60 >60   GLU 305* 361* 296* 208*   CA 8.2* 8.3* 8.6 8.8   MG 1.9  --   --   --    PO4 2.6  --   --   --    ALBUMIN 3.2*  --   --   --    ALKPHOS 352*  --   --   --    AST 31  --   --   --    ALT 21  --   --   --    TOTBILI 1.4*  --   --   --  Other labs   Results for DELFINO, FRIESEN (MRN [id number]) as of 05/21/2018 14:55   Ref. Range 05/21/2018 09:58   pH-Arterial Latest Ref Range: 7.35 - 7.45  7.33 (L)   pCO2-Arterial Latest Ref Range: 35 - 45 MMHG 58 (H)   pO2-Arterial Latest Ref Range: 80 - 100 MMHG 70 (L)   Bicarbonate-ART-Cal Latest Ref Range: 21 - 28 MMOL/L 27.8   Base Excess-Arterial Latest Units: MMOL/L 3.9   O2 Sat-Arterial Latest Ref Range: 95 - 99 % 93.7 (L)        Point of Care Testing  (Last 24 hours)  Glucose: (!) 208 (05/21/18 0532)  POC Glucose (Download): (!) 218 (05/21/18 0749)    Radiology and other Diagnostics Review:          CT CHEST W (04/20/18)  1. ???No thoracic lymphadenopathy or pulmonary metastatic disease.   2. ???Multifocal osseous metastatic disease. Please see the separately   dictated same-day bone scan for full discussion of osseous findings.    Medications  Scheduled IV PRN   aspirin EC tablet 81 mg, 81 mg, Oral, QHS  atorvastatin (LIPITOR) tablet 80 mg, 80 mg, Oral, QHS  carvediloL (COREG) tablet 3.125 mg, 3.125 mg, Oral, BID w/meals  cefepime (MAXIPIME) 2 g in sodium chloride 0.9% (NS) 100 mL IVPB (MB+), 2 g, Intravenous, Q8H*  ferrous sulfate (FEOSOL) tablet 325 mg, 325 mg, Oral, BID w/meals  insulin aspart U-100 (NOVOLOG FLEXPEN) injection PEN 0-12 Units, 0-12 Units, Subcutaneous, ACHS (22)

## 2018-05-21 NOTE — Progress Notes
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 Fruit was undergoing an MRI at [facility] when around 1635 his face and chest became flushed. He developed itching throughout and was quickly removed from MRI. He became dizzy,???c/o SOA, and suddenly stopped breathing with no pulse. CPR was started, Benadryl, Solumedrol &???2 doses of 0.3 IM Epi were given. ROSC was obtained after two rounds of CPR per report then he was transferred to [place]-[facility] via EMS for further management.) now on 05/18/18 transferred to [place] and directly admitted for worsening sob since yesterday.  ???  SOB POA  Mixed acute on chronic respiratory failure  Suspect COPD exacerbation, OHS  OSA???  - CXR with volume overload, ABG ordered - to follow  - Duonebs???Q4hr & PRN???  - Continue PTA CPAP QHS   - Wean O2 as able???- add IS, encourage OOB and cough, deep breathing, currently on Oxygen 3 lit, at home on 2 lit at rest-4 lit with exercise.   -???held???PTA lasix on admission  - Echo 05/14/18: Technically difficult study.Estimated ejection fraction 40 to 45%. Grade 1 diastolic dysfunction. Estimated peak systolic PA pressure 29 mmHg  - monitor on telemetry  - at OSH from day of admission D dimer was 1328. Patient initially stated he took his Xarelto on 2/3, then later on further clarification with wife and patient - they think actually patient did not take his xarelto and unsure if he had it also day pta. On admission reports he has been regular with it, assuming he got it when he was hospitalized.   - pt PTA on xarelto -> therefore given above and increased PE risk started lovenox 100 BID on admission, will transition to [place] today - pt denies any bleeding on admission  ???- started also solumedrol, and empiric abx on admission - de-escalate based on clinical course. On Vanc/Cefepime for now  -on admission did not order CTA chest (2/2 contrast allergy) or V/Q scan (pt has COPD, so likely unreliable) since patient anyway is supposed on be on anticoagulation and will not change management, will defer further specifics to onco/pulmonology  -Reports breathing improved, continue current management  ???  Depression  Acute on Chronic Pain  - persistent pain on chest from CPR  - Chronic pain described as constant aching worse when he lays down to sleep at night. Per family pain has been present since cancer dx.   - PTA Morphine SR 15mg  QHS???&???Norco q3hr PRN   PLAN  - Restarted PTA morphine SR on admission, will hold and place on Norco prn-PTA med  - consider PRN Fentanyl for breakthrough  - added Lidoderm patches  - Resume PTA Zoloft ???  ???  Recent anaphylactic shock and cardiac arrest ???  - 1/29 Cardiac Arrest suspected secondary to anaphylaxis from MRI contrast.   - C/o chest pain but states its around bruised ares from CPR.   - Replace electrolytes for a goal K+ >4.0 & Mag+ >2.0.  -???hold PTA prednisone  ???  HFpEF  CAD s/p CABG 2012  HTN & HLD  - CAD s/p  stent ~31yrs ago???  -???CABG in 2012 through [facility].   - PTA Lisinopril, Carvedilol, Lasix &???Lipitor.   - PTA ASA 81mg  & [place] [facility]   ???  DM  Hypothyroidism  - PTA Synthroid - continue  -???Adjust insulin: 50 QHS, 10 TID, LDCF, change to MDCF, and 12 units TID novolog, increase Lantus to 70 units QHS ( home dose 81 units, currently on 50 units qhs)   ???  Iron Deficiency Anemia???  - Baseline Hgb ~ 8-9  - PTA Ferrous Sulfate & B12 1000mg   ???  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 Lura Em with [facility]  - Dx 01/04/2018 with staging studies at the time revealing widespread osseous metastatic cancer. 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 He has also been having more difficulty walking recently. He has not had any bowel or bladder difficulties.   - 1/6 Was started on Morphine 15mg  PO Qhs for pain in addition to continuing Norco.   - 1/13 Saw Dr Reola Mosher She with [facility]Onc for evaluation of palliative radiation. They also recommended additional MRI imaging.   - PTA Zytiga 1,000mg  PO daily,???non formulary - resume upon discharge. Pending Oncology recs???  ???  Code Status: full  Dispo: continue???inpatient  ???  ______________________________________________________________________________  ???  ________________________________________________________________________    Alexander Holland is a 68 y.o. male.  Patient reports breathing better. No N/V, Chest pain  No fevers    Medications  Scheduled Meds:aspirin EC tablet 81 mg, 81 mg, Oral, QHS  atorvastatin (LIPITOR) tablet 80 mg, 80 mg, Oral, QHS  carvediloL (COREG) tablet 3.125 mg, 3.125 mg, Oral, BID w/meals  cefepime (MAXIPIME) 2 g in sodium chloride 0.9% (NS) 100 mL IVPB (MB+), 2 g, Intravenous, Q8H*  ferrous sulfate (FEOSOL) tablet 325 mg, 325 mg, Oral, BID w/meals  insulin aspart U-100 (NOVOLOG FLEXPEN) injection PEN 0-12 Units, 0-12 Units, Subcutaneous, ACHS (22)  insulin aspart U-100 (NOVOLOG FLEXPEN) injection PEN 12 Units, 12 Units, Subcutaneous, TID w/ meals  insulin glargine (LANTUS SOLOSTAR) injection PEN 70 Units, 70 Units, Subcutaneous, QHS  levothyroxine (SYNTHROID) tablet 25 mcg, 25 mcg, Oral, QDAY  lidocaine (LIDODERM) 5 % topical patch 2 patch, 2 patch, Topical, QDAY    And  Verification of Patch Placement and Integrity - Lidocaine 5%, , Transdermal, BID methylPREDNISolone (SOLU-MEDROL PF) injection 62.5 mg, 62.5 mg, Intravenous, Q8H  pantoprazole DR (PROTONIX) tablet 40 mg, 40 mg, Oral, QDAY(21)  rivaroxaban (XARELTO) tablet 20 mg, 20 mg, Oral, QDAY w/dinner  sertraline (ZOLOFT) tablet 50 mg, 50 mg, Oral, QDAY  vancomycin (VANCOCIN) 1,500 mg in sodium chloride 0.9% (NS) IVPB, 15 mg/kg, Intravenous, Q12H*    Continuous Infusions:  PRN and Respiratory Meds:acetaminophen Q6H PRN, albuterol 0.083% PRN, docusate QDAY PRN, hydrALAZINE Q6H PRN, HYDROcodone/acetaminophen Q4H PRN, melatonin QHS PRN, senna QHS PRN, vancomycin (VANCOCIN) IVPB Q12H* **AND** vancomycin, pharmacy to manage Per Pharmacy        Objective:                          Vital Signs: Last Filed                 Vital Signs: 24 Hour Range   BP: 143/71 (02/05  1924)  Temp: 36.9 ???C (98.4 ???F) (02/05 1924)  Pulse: 83 (02/05 1924)  Respirations: 18 PER MINUTE (02/05 1924)  SpO2: 97 % (02/05 1924) BP: (134-152)/(65-89)   Temp:  [36.1 ???C (97 ???F)-36.9 ???C (98.4 ???F)]   Pulse:  [71-88]   Respirations:  [18 PER MINUTE]   SpO2:  [94 %-100 %]    Intensity Pain Scale (Self Report): 6 (05/20/18 1435) Vitals:    05/18/18 2200   Weight: 100.3 kg (221 lb 1.9 oz)       Intake/Output Summary:  (Last 24 hours)    Intake/Output Summary (Last 24 hours) at 05/20/2018 2009  Last data filed at 05/20/2018 1800  Gross per 24 hour   Intake 1200 ml   Output 1450 ml   Net -250 ml      Stool Occurrence: 1    Physical Exam  General:  Alert, awake, oriented x 3 , cooperative, no distress   Lungs:  CTA, few rales basely  Heart:   Regular rate and rhythm, S1, S2 normal, no murmur  Abdomen:  Soft, non-tender.  Bowel sounds normal.    Extremities: no edema  Neurologic: Non focal grossly    Lab Review  24-hour labs:    Results for orders placed or performed during the hospital encounter of 05/18/18 (from the past 24 hour(s))   POC GLUCOSE    Collection Time: 05/19/18 10:09 PM   Result Value Ref Range    Glucose, POC 328 (H) 70 - 100 MG/DL CBC AND DIFF    Collection Time: 05/20/18  5:36 AM   Result Value Ref Range    White Blood Cells 4.0 (L) 4.5 - 11.0 K/UL    RBC 3.13 (L) 4.4 - 5.5 M/UL    Hemoglobin 8.3 (L) 13.5 - 16.5 GM/DL    Hematocrit 16.1 (L) 40 - 50 %    MCV 85.9 80 - 100 FL    MCH 26.4 26 - 34 PG    MCHC 30.8 (L) 32.0 - 36.0 G/DL    RDW 09.6 (H) 11 - 15 %    Platelet Count 114 (L) 150 - 400 K/UL    MPV 7.9 7 - 11 FL    Neutrophils 91 (H) 41 - 77 %    Lymphocytes 3 (L) 24 - 44 %    Monocytes 5 4 - 12 %    Eosinophils 0 0 - 5 %    Basophils 1 0 - 2 %    Absolute Neutrophil Count 3.60 1.8 - 7.0 K/UL    Absolute Lymph Count 0.10 (L) 1.0 - 4.8 K/UL    Absolute Monocyte Count 0.20 0 - 0.80 K/UL    Absolute Eosinophil Count 0.00 0 - 0.45 K/UL    Absolute Basophil Count 0.00 0 - 0.20 K/UL   BASIC METABOLIC PANEL    Collection Time: 05/20/18  5:36 AM   Result Value Ref Range    Sodium 136 (L) 137 - 147 MMOL/L    Potassium 4.7 3.5 - 5.1 MMOL/L    Chloride 98 98 - 110 MMOL/L    CO2 33 (H) 21 - 30 MMOL/L    Anion Gap 5 3 - 12    Glucose 296 (H) 70 - 100 MG/DL    Blood Urea Nitrogen 30 (H) 7 - 25 MG/DL    Creatinine 0.45 0.4 - 1.24 MG/DL    Calcium 8.6 8.5 - 40.9 MG/DL    eGFR Non African American >60 >60 mL/min    eGFR  African American >60 >60 mL/min   POC GLUCOSE    Collection Time: 05/20/18 10:04 AM   Result Value Ref Range    Glucose, POC 373 (H) 70 - 100 MG/DL   POC GLUCOSE    Collection Time: 05/20/18  1:50 PM   Result Value Ref Range    Glucose, POC 275 (H) 70 - 100 MG/DL   POC GLUCOSE    Collection Time: 05/20/18  5:37 PM   Result Value Ref Range    Glucose, POC 279 (H) 70 - 100 MG/DL       Point of Care Testing  (Last 24 hours)  Glucose: (!) 296 (05/20/18 0536)  POC Glucose (Download): (!) 279 (05/20/18 1737)    Radiology and other Diagnostics Review:    Pertinent radiology reviewed.    Gladis Riffle, MD   Med private P pager [phone]

## 2018-05-21 NOTE — Progress Notes
Cung L Wholey.received radiation therapy treatment today.  1 of 5 treatments.

## 2018-05-21 NOTE — Progress Notes
sit to stand and stand to sit with SBA for safety and use of RW. Patient completed level 3 Theraband exercises, 3 exercises 5 times each, and no rest breaks needed.    Activity Tolerance  Endurance: 3/5 Tolerates 25-30 Minutes Exercise w/Multiple Rests  Sitting Balance: 5/5 Moves/Returns Trunkal Midpoint in All Planes > 2 Inches    Cognition  Overall Cognitive Status: WFL to Adequately Complete Self Care Tasks Safely    Assessment  Assessment: Decreased ADL Status;Decreased UE Strength;Decreased Safe/Judg during ADL;Decreased Endurance;Decreased Self-Care Trans  Prognosis: Good;w/ Family  Goal Formulation: Patient  Comments: Patient presents with decreased endurance and decreased activity tolerance which impacts his ability to be independent and safe in ADLs and functional mobility.     Plan  Progress: Improving as Expected  OT Frequency: Discontinue OT    ADL Goals  Patient Will Perform Grooming: Standing at Sink, met  Patient Will Perform LE Dressing: w/ Stand By Assist, met    Functional Transfer Goals  Pt Will Perform All Functional Transfers: w/ Stand By Assist, met      OT Discharge Recommendations  Recommendation: Home/prior living situation  Patient Currently Requires Physical Assist With: All mobility;All home functioning ADLs      Therapist: Adline Peals, [place] [id number]  Date: 05/21/2018

## 2018-05-22 ENCOUNTER — Encounter: Admit: 2018-05-22 | Discharge: 2018-05-22 | Payer: MEDICARE

## 2018-05-22 NOTE — Progress Notes
Pulmonary Progress Note         Name:  Alexander Holland   LOVFI'E Date:  05/22/2018  Admission Date: 05/18/2018  LOS: 4 days           Impression:      Alexander Holland  is a 68 y.o. male with co-morbidities of: 1) metastatic prostate carcinoma: 2) CAD s/p CABG; 3)  CHF with recent echo demonstrating EF 4-45%; 4) morbid obesity who was transferred to [facility] after recent anaphylactic episode during MRI with respiratory/cardiac arrest (requiring intubation and chest compressions) secondary to contrast.  Consulted for hypercarbic respiratory failure.    #Morbid obesity  #OSA  #Hypercapnic respiratory failure  #COPD   - smoked 2 ppd x 25 years, quit 1997   - [facility]   - PTA Albuterol (HFA, neb)   - CT without emphysema   - no PFT available for review   - ABG 7.42/52/103/7.7/31.5   - CPAP compliant, unsure of pressure, no significant weight change since initial study    > Home CPAP download: pressure 15 cmH20; residual AHI 0.4; avg leak 12 min, avg use 11 hr/noc       #Metastatic prostate cancer (stage IV)  #Acute on chronic pain  - PTA Zytiga 1,000mg  PO daily,???  - follows with Dr. Lura Em and Dr. Reola Mosher She w/ [facility]  - bone mets; pursuing palliative radiation   - PTA xarelto    #CHF (EF 40-45%)  - Echo 05/14/18:???Technically difficult study.Estimated ejection fraction 40 to 45%.???Grade 1 diastolic dysfunction.???Estimated peak systolic PA pressure 29 mmHg      Recommendations:     - de-escalate steroids to Prednisone 40 mg for additional 2 days  - consider de-escalating antibiotic coverage to PO Augmentin x 5 days  - repeat ABG this AM with stable hypercapnia, compensated  - home machine download with set pressure of 15 cmH20 and no residual AHI, will need to verify hospital machine pressure range as he may be under-treated on hospital machine (RT  Unable to verify hospital auto-machine pressure range) - pt to use home CPAP (at bedside) and consider repeat noc ox given concern for inadequate pressure on hospital CPAP  - 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 Above plan discussed with attending physician, Dr. Virl Cagey.    Marylynn Pearson, APRN-NP  Pager [phone]  ________________________________________________________________________    Subjective  Denies any shortness of breath or cough. Notes pain with deep inspiration r/t previous CPR. Respiratory symptoms otherwise at baseline without wheeze or sputum production.     Review of Systems   Constitutional: Positive for malaise/fatigue.   Respiratory: Positive for shortness of breath. Negative for cough and sputum production.    Gastrointestinal: Negative for diarrhea, nausea and vomiting.   All other systems reviewed and are negative.        Objective  Vital Signs: Last Filed Vital Signs: 24 Hour Range   BP: 165/61 (02/07 0828)  Temp: 36.3 ???C (97.4 ???F) (02/07 1884)  Pulse: 90 (02/07 0828)  Respirations: 18 PER MINUTE (02/07 0828)  SpO2: 92 % (02/07 0828) BP: (130-169)/(53-82)   Temp:  [36.3 ???C (97.4 ???F)-36.6 ???C (97.9 ???F)]   Pulse:  [74-146]   Respirations:  [16 PER MINUTE-22 PER MINUTE]   SpO2:  [75 %-99 %]        Intake/Output  Summary:  (Last 24 hours)    Intake/Output Summary (Last 24 hours) at 05/22/2018 0856  Last data filed at 05/22/2018 0405  Gross per 24 hour   Intake 1048 ml   Output 750 ml   Net 298 ml       General: Awake, in no acute distress. Obese  HEENT: NC/AT, sclera non-icteric, moist mucus membranes.  CV: regular rhythm, normal rate, no murmurs.  Lungs: diminished bilateral bases  Abdomen: soft, non-tender, non-distended, normo-active bowel sounds  Extremities: no edema, pedal pulses 2 (+) bilaterally  Neuro: no focal deficits  Skin: no rashes    Lab Review  Recent CBC   Recent Labs

## 2018-05-22 NOTE — Progress Notes
Alexander Holland.received radiation therapy treatment today.  2 of 5 treatments.

## 2018-05-22 NOTE — Progress Notes
RT Exercise Oximetry Note    NAME:Alexander Holland                                                           MRN: [id number]                 DOB:06-23-1950            AGE: 69 y.o.  ADMISSION DATE: 05/18/2018             DAYS ADMITTED: LOS: 4 days      Date performed: 05/22/2018  Time performed: 1505  Time walked: 6 minutes    At Rest While Awake Results    Room air at rest while awake:  SpO2 = 87%    Oxygen dose required at rest while awake: 2 lpm with resulting SpO2 93%  SpO2 at 1 liters per minute = 87%  SpO2 at 2 liters per minute = 93%      If required oxygen dose with rest is greater than 4 lpm:      With Exercise Results:    Room air SpO2 with exercise, if needed: 85%    Oxygen required with exercise: 2 lpm with resulting SpO2 90%  SpO2 at 1 liters per minute = 87%  SpO2 at 2 liters per minute = 90%      If required oxygen dose with exercise is greater than 4 lpm:      Comments: Patient requires 2lpm of O2 with rest and with exercise.      Therapist: Loistine Chance, RT

## 2018-05-23 MED ORDER — INSULIN ASPART 100 UNIT/ML SC FLEXPEN
0-6 [IU] | Freq: Before meals | SUBCUTANEOUS | 0 refills | Status: DC
Start: 2018-05-23 — End: 2018-05-25
  Administered 2018-05-25: 04:00:00 2 [IU] via SUBCUTANEOUS

## 2018-05-23 NOTE — Progress Notes
General Progress Note    Name:  CULLAN FEARRINGTON   ZOXWR'U Date:  05/23/2018  Admission Date: 05/18/2018  LOS: 5 days                     Assessment/Plan:    Active Problems:    Hypercapnia    68 yo???male???with a history of stage IV???Prostate???Cancer, CAD, HFrEF, diabetes, COPD, HTN and HLD, recent cardiac arrest after an anaphylactic reaction (on 05/13/18 mr. [place] was undergoing an MRI at [facility] when around 1635 his face and chest became flushed. He developed itching throughout and was quickly removed from MRI. He became dizzy,???c/o SOA, and suddenly stopped breathing with no pulse. CPR was started, Benadryl, Solumedrol &???2 doses of 0.3 IM Epi were given. ROSC was obtained after two rounds of CPR per report then he was transferred to [place]-[facility] via EMS for further management.) now on 05/18/18 transferred to [place] and directly admitted for worsening sob since yesterday.  ???  SOB POA  Mixed acute on chronic respiratory failure  Suspect COPD exacerbation, OHS  OSA???  - CXR with volume overload, ABG with elevated PCO2  - Duonebs???Q4hr & PRN???  - Continue PTA CPAP QHS   - Wean O2 as able???- add IS, encourage OOB and cough, deep breathing, currently on Oxygen 2 lit, at home on 2 lit at rest-4 lit with exercise.   -???held???PTA lasix on admission  - Echo 05/14/18: Technically difficult study.Estimated ejection fraction 40 to 45%. Grade 1 diastolic dysfunction. Estimated peak systolic PA pressure 29 mmHg  - monitor on telemetry  - at OSH from day of admission D dimer was 1328. Patient initially stated he took his Xarelto on 2/3, then later on further clarification with wife and patient - they think actually patient did not take his xarelto and unsure if he had it also day pta. On admission reports he has been regular with it, assuming he got it when he was hospitalized.   - pt PTA on xarelto -> therefore given above and increased PE risk started lovenox 100 BID on admission, transitioned to [employer], increase Lantus to 70 units QHS ( home dose 81 units, was initially placed on 50 units qhs).  Glucose low range today, changed from MDCF to [facility].  Follow glucose levels  ???  Iron Deficiency Anemia???  - Baseline Hgb ~ 8-9  - PTA Ferrous Sulfate & B12 1000mg   ???  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 Lura Em with [facility]  - Dx 01/04/2018 with staging studies at the time revealing widespread osseous metastatic cancer.  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 He has also been having more difficulty walking recently. He has not had any bowel or bladder difficulties.   - 1/6 Was started on Morphine 15mg  PO Qhs for pain in addition to continuing Norco.   - 1/13 Saw Dr Reola Mosher She with Rad[facility]Onc for evaluation of palliative radiation. They also recommended additional MRI imaging.   - PTA Zytiga 1,000mg  PO daily,???non formulary - resume upon discharge. Pending Oncology recs???  ???  Code Status: full  Dispo: continue???inpatient  Possible discharge in a.m.  ______________________________________________________________________________  ???  ________________________________________________________________________    [name] is  a 68 y.o. male.  Patient reports breathing better but still short of breath with minimal exertion. No N/V, Chest pain  No fevers    Medications  Scheduled Meds:amoxicillin-potassium clavulanate (AUGMENTIN) tablet 875 mg, 875 mg, Oral, BID w/meals  aspirin EC tablet 81 mg, 81 mg, Oral, QHS  atorvastatin (LIPITOR) tablet 80 mg, 80 mg, Oral, QHS  carvediloL (COREG) tablet 3.125 mg, 3.125 mg, Oral, BID w/meals  ferrous sulfate (FEOSOL) tablet 325 mg, 325 mg, Oral, BID w/meals insulin aspart U-100 (NOVOLOG FLEXPEN) injection PEN 0-12 Units, 0-12 Units, Subcutaneous, ACHS (22)  insulin aspart U-100 (NOVOLOG FLEXPEN) injection PEN 12 Units, 12 Units, Subcutaneous, TID w/ meals  insulin glargine (LANTUS SOLOSTAR) injection PEN 70 Units, 70 Units, Subcutaneous, QHS  levothyroxine (SYNTHROID) tablet 25 mcg, 25 mcg, Oral, QDAY  lidocaine (LIDODERM) 5 % topical patch 2 patch, 2 patch, Topical, QDAY    And  Verification of Patch Placement and Integrity - Lidocaine 5%, , Transdermal, BID  pantoprazole DR (PROTONIX) tablet 40 mg, 40 mg, Oral, QDAY(21)  rivaroxaban (XARELTO) tablet 20 mg, 20 mg, Oral, QDAY w/dinner  sertraline (ZOLOFT) tablet 50 mg, 50 mg, Oral, QDAY    Continuous Infusions:  PRN and Respiratory Meds:acetaminophen Q6H PRN, albuterol 0.083% PRN, docusate QDAY PRN, hydrALAZINE Q6H PRN, HYDROcodone/acetaminophen Q4H PRN, melatonin QHS PRN, senna QHS PRN        Objective:                          Vital Signs: Last Filed                 Vital Signs: 24 Hour Range   BP: 135/54 (02/08 0854)  Temp: 36.6 ???C (97.9 ???F) (02/08 3086)  Pulse: 90 (02/08 0854)  Respirations: 18 PER MINUTE (02/08 0854)  SpO2: 99 % (02/08 0854) BP: (131-195)/(53-83)   Temp:  [36.5 ???C (97.7 ???F)-37 ???C (98.6 ???F)]   Pulse:  [73-90]   Respirations:  [18 PER MINUTE]   SpO2:  [92 %-100 %]    Intensity Pain Scale (Self Report): 6 (05/22/18 2130) Vitals:    05/18/18 2200   Weight: 100.3 kg (221 lb 1.9 oz)       Intake/Output Summary:  (Last 24 hours)    Intake/Output Summary (Last 24 hours) at 05/23/2018 0925  Last data filed at 05/23/2018 0854  Gross per 24 hour   Intake ???   Output 1225 ml   Net -1225 ml      Stool Occurrence: 0    Physical Exam  General:  Alert, awake, oriented x 3 , cooperative, no distress   Lungs:  CTA, few rales basely  Heart:   Regular rate and rhythm, S1, S2 normal, no murmur  Abdomen:  Soft, non-tender.  Bowel sounds normal.    Extremities: no edema  Neurologic: Non focal grossly    Lab Review eGFR Non African American >60 >60 mL/min    eGFR African American >60 >60 mL/min   POC GLUCOSE    Collection Time: 05/23/18  8:57 AM   Result Value Ref Range    Glucose, POC 62 (L) 70 - 100 MG/DL   POC GLUCOSE    Collection Time: 05/23/18  9:14 AM   Result Value Ref Range    Glucose, POC 78 70 - 100 MG/DL       Point of Care Testing  (Last 24 hours)  Glucose: (!) 112 (05/23/18 0507)  POC Glucose (Download): 78 (05/23/18 0914)  Radiology and other Diagnostics Review:    Pertinent radiology reviewed.    Brayton Mars, MD   Med private P pager [phone]

## 2018-05-24 MED ORDER — INSULIN GLARGINE 100 UNIT/ML (3 ML) SC INJ PEN
60 [IU] | Freq: Every evening | SUBCUTANEOUS | 0 refills | Status: DC
Start: 2018-05-24 — End: 2018-05-25

## 2018-05-24 MED ORDER — PREDNISONE 5 MG PO TAB
5 mg | Freq: Two times a day (BID) | ORAL | 0 refills | Status: DC
Start: 2018-05-24 — End: 2018-05-25
  Administered 2018-05-24 – 2018-05-25 (×2): 5 mg via ORAL

## 2018-05-24 MED ORDER — LISINOPRIL 2.5 MG PO TAB
2.5 mg | Freq: Every day | ORAL | 0 refills | Status: DC
Start: 2018-05-24 — End: 2018-05-25
  Administered 2018-05-24 – 2018-05-25 (×2): 2.5 mg via ORAL

## 2018-05-24 MED ORDER — FUROSEMIDE 20 MG PO TAB
40 mg | Freq: Every day | ORAL | 0 refills | Status: DC
Start: 2018-05-24 — End: 2018-05-25
  Administered 2018-05-24 – 2018-05-25 (×2): 40 mg via ORAL

## 2018-05-25 ENCOUNTER — Inpatient Hospital Stay: Admit: 2018-05-25 | Discharge: 2018-05-25 | Payer: MEDICARE

## 2018-05-25 ENCOUNTER — Encounter: Admit: 2018-05-25 | Discharge: 2018-05-25 | Payer: MEDICARE

## 2018-05-25 ENCOUNTER — Inpatient Hospital Stay: Admit: 2018-05-18 | Discharge: 2018-05-18 | Payer: MEDICARE

## 2018-05-25 DIAGNOSIS — K219 Gastro-esophageal reflux disease without esophagitis: ICD-10-CM

## 2018-05-25 DIAGNOSIS — E1165 Type 2 diabetes mellitus with hyperglycemia: ICD-10-CM

## 2018-05-25 DIAGNOSIS — I252 Old myocardial infarction: ICD-10-CM

## 2018-05-25 DIAGNOSIS — C61 Malignant neoplasm of prostate: ICD-10-CM

## 2018-05-25 DIAGNOSIS — F329 Major depressive disorder, single episode, unspecified: ICD-10-CM

## 2018-05-25 DIAGNOSIS — G4733 Obstructive sleep apnea (adult) (pediatric): ICD-10-CM

## 2018-05-25 DIAGNOSIS — Z794 Long term (current) use of insulin: ICD-10-CM

## 2018-05-25 DIAGNOSIS — I5042 Chronic combined systolic (congestive) and diastolic (congestive) heart failure: ICD-10-CM

## 2018-05-25 DIAGNOSIS — E039 Hypothyroidism, unspecified: ICD-10-CM

## 2018-05-25 DIAGNOSIS — Z8614 Personal history of Methicillin resistant Staphylococcus aureus infection: ICD-10-CM

## 2018-05-25 DIAGNOSIS — D509 Iron deficiency anemia, unspecified: ICD-10-CM

## 2018-05-25 DIAGNOSIS — C7951 Secondary malignant neoplasm of bone: ICD-10-CM

## 2018-05-25 DIAGNOSIS — Z87891 Personal history of nicotine dependence: ICD-10-CM

## 2018-05-25 DIAGNOSIS — J441 Chronic obstructive pulmonary disease with (acute) exacerbation: Principal | ICD-10-CM

## 2018-05-25 DIAGNOSIS — E785 Hyperlipidemia, unspecified: ICD-10-CM

## 2018-05-25 DIAGNOSIS — J9621 Acute and chronic respiratory failure with hypoxia: ICD-10-CM

## 2018-05-25 DIAGNOSIS — G8929 Other chronic pain: ICD-10-CM

## 2018-05-25 DIAGNOSIS — J9622 Acute and chronic respiratory failure with hypercapnia: ICD-10-CM

## 2018-05-25 DIAGNOSIS — Z9981 Dependence on supplemental oxygen: ICD-10-CM

## 2018-05-25 DIAGNOSIS — I2510 Atherosclerotic heart disease of native coronary artery without angina pectoris: ICD-10-CM

## 2018-05-25 DIAGNOSIS — I130 Hypertensive heart and chronic kidney disease with heart failure and stage 1 through stage 4 chronic kidney disease, or unspecified chronic kidney disease: ICD-10-CM

## 2018-05-25 DIAGNOSIS — Z951 Presence of aortocoronary bypass graft: ICD-10-CM

## 2018-05-25 DIAGNOSIS — Z87892 Personal history of anaphylaxis: ICD-10-CM

## 2018-05-25 DIAGNOSIS — Z955 Presence of coronary angioplasty implant and graft: ICD-10-CM

## 2018-05-25 DIAGNOSIS — E872 Acidosis: ICD-10-CM

## 2018-05-25 MED ORDER — INSULIN GLARGINE 100 UNIT/ML (3 ML) SC INJ PEN
60 [IU] | Freq: Every evening | SUBCUTANEOUS | 0 refills | 60.00000 days | Status: AC
Start: 2018-05-25 — End: ?

## 2018-05-25 MED ORDER — AMOXICILLIN-POT CLAVULANATE 875-125 MG PO TAB
1 | ORAL_TABLET | Freq: Two times a day (BID) | ORAL | 0 refills | 7.00000 days | Status: AC
Start: 2018-05-25 — End: ?

## 2018-05-25 MED ORDER — INSULIN ASPART 100 UNIT/ML SC FLEXPEN
12 [IU] | Freq: Three times a day (TID) | SUBCUTANEOUS | 0 refills | 42.00000 days | Status: AC
Start: 2018-05-25 — End: ?

## 2018-05-25 NOTE — Progress Notes
PHYSICAL THERAPY  MOBILITY NOTE      Name: Alexander Holland            MRN: [id number]                DOB: 1950/07/27          Age: 68 y.o.  Admission Date: 05/18/2018             LOS: 7 days        Patient was assigned for activity with the mobility aide by the supervising therapist.  Patient declined to participate despite encouragement. Pt requested to rest before going down to radiation treatment. Notified pt that he was assigned to PT for the afternoon and was agreeable to mobilizing then. PT Chris notified and Rehab Tech will see pt in PM if PT is unable to do so.

## 2018-05-25 NOTE — Care Plan
Problem: Discharge Planning  Goal: Participation in plan of care  Outcome: Goal Achieved  Goal: Knowledge regarding plan of care  Outcome: Goal Achieved  Goal: Prepared for discharge  Outcome: Goal Achieved     Problem: Self-Care Deficit  Goal: Maximize ADL functioning  Outcome: Goal Achieved     Problem: Mobility/Activity Intolerance  Goal: Maximize functional ADL's and mobility outcomes  Outcome: Goal Achieved

## 2018-05-25 NOTE — Discharge Instructions - Pharmacy
Physical Exam:  Vital Signs: Last Filed In 24 Hours Vital Signs: 24 Hour Range   BP: (P) 181/67 (02/03 1953)  Temp: (P) 36.8 ???C (98.3 ???F) (02/03 1953)  Pulse: (P) 97 (02/03 1953)  Respirations: (P) 18 PER MINUTE (02/03 1953)  SpO2: (P) 94 % (02/03 1953) BP: (P) 181/67  Temp:  [36.8 ???C (98.3 ???F)]   Pulse:  [97]   Respirations:  [18 PER MINUTE]   SpO2:  [94 %]    Intensity Pain Scale (Self Report): (P) 10 (05/18/18 1953) ???   ???  General:  Alert, awake, oriented x 3 , cooperative, sob  Head:  Normocephalic, without obvious abnormality, atraumatic  Eyes:  Conjunctivae/corneas clear   Nose: Nares normal. Mucosa normal.  No drainage or sinus tenderness  Throat: Lips, mucosa and tongue normal  Neck:    Supple, symmetrical, trachea midline  Lungs:  Expiratory wheezing, few rales basely  Heart:   Regular rate and rhythm, S1, S2 normal, no murmur  Abdomen:  Soft, non-tender.  Bowel sounds normal.    Extremities: Extremities normal, atraumatic, no cyanosis or edema  Skin: Skin color, texture, turgor normal.    Neurologic: Non focal grossly  ???  Admission Lab/Radiology studies notable for:   Hgb 8.5, plt 100, wbc 5.6; Inr 1.3  Cl 94, HCO3 35, Cr 1.00, glucose 305; Alk phos 352; lactic acid 1.2  Single view of the chest    Clinical history:    Shortness of breath.    Findings:    Comparison chest film: May 13, 2018    Sternotomy wires and mediastinal clips are again noted. The heart remains   mildly enlarged with mild pulmonary vascular congestion and interstitial   edema noted. Scattered areas of scarring are again noted. There are small   bilateral pleural effusions. No pneumothorax is identified.    IMPRESSION      Mild cardiomegaly with findings of CHF. Small bilateral pleural effusions.      ???Finalized by Golda Acre, M.D. on 05/19/2018 8:37 AM. Dictated by Golda Acre, M.D. on 05/19/2018 8:36 AM.      Brief Hospital Course:  The patient was admitted and the following issues was enrolled on the CHARTED2 trial, and randomized to the abiraterone + cabazitaxel arm.  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 He has also been having more difficulty walking recently.???He has not had any bowel or bladder difficulties.   - 1/6 Was started on Morphine 15mg  PO Qhs for pain in addition to continuing Norco -- regimen adjusted to only norco on discharge  - 1/13 Saw Dr Reola Mosher She with Rad-Onc for evaluation of palliative radiation. They also recommended additional MRI imaging.   - PTA Zytiga 1,000mg  PO daily,???non formulary - resume upon discharge. Seen by Oncology ???  ???  Condition at Discharge: Stable    Discharge Diagnoses:      Hospital Problems        Active Problems    Hypercapnia          Surgical Procedures: None    Significant Diagnostic Studies and Procedures: noted in brief hospital course    Consults:  Oncology and Pulmonary    Patient Disposition: Home       Patient instructions/medications:       Activity as Tolerated    It is important to keep increasing your activity level after you leave the hospital.  Moving around can help  prevent blood clots, lung infection (pneumonia) and other problems.  Gradually increasing the number of times you are up moving around will help you return to your normal activity level more quickly.  Continue to increase the number of times you are up to the chair and walking daily to return to your normal activity level. Begin to work toward your normal activity level at discharge     Diabetes Information    Diabetes is managed by checking your blood sugar, taking your medicine, exercising and eating healthy.    Target blood sugar range is 80 - 140 mg/dL    Check blood sugars before meals and at bedtime    Treatment of low blood sugars: If your blood sugar is low, you may feel shaky, weak, sweaty, hungry, confused, or have a headache. Check your *Only take your opioid medication as prescribed.  If your pain is not controlled with the prescribed dose, or the medication is not lasting long enough, call your doctor.  *Do not break or crush your opioid medication unless your doctor or pharmacist says you can.  With certain medications, this can be dangerous, and may cause death.  *Never share your medications with others, even if they appear to have a good reason.  Never take someone else's pain medication-this is dangerous, and illegal (a crime).  Overdoses and deaths have occurred.  *Keep your opioid medications safe, as you would with cash, in a lock box or similar container.  *Make sure your opioids are going to be secure, especially if you are around children or teens.  *Talk with your doctor or pharmacist before you take other medications.  *Avoid driving, operating machinery, or drinking alcohol while taking opioid pain medication.  This may be unsafe.    Pain medications can cause constipation. Constipation is bowel movements that are less often than normal. Stools often become very hard and difficult to pass. This may lead to stomach pain and bloating. It may also cause pain when trying to use the bathroom. Constipation may be treated with suppositories, laxatives or stool softeners. A diet high in fiber with plenty of fluids helps to maintain regular, soft bowel movements.     Additional Discharge Instructions    Please discuss with your primary care physician, need to obtain a BiPAP as outpatient for nocturnal therapy.      Current Discharge Medication List       START taking these medications    Details   amoxicillin-potassium clavulanate (AUGMENTIN) 875/125 mg tablet Take one tablet by mouth twice daily with meals for 2 days. Take with food.  Qty: 4 tablet, Refills: 0    PRESCRIPTION TYPE:  Normal          CONTINUE these medications which have been CHANGED or REFILLED    Details

## 2018-05-26 ENCOUNTER — Encounter: Admit: 2018-05-26 | Discharge: 2018-05-26 | Payer: MEDICARE

## 2018-05-27 ENCOUNTER — Encounter: Admit: 2018-05-27 | Discharge: 2018-05-27 | Payer: MEDICARE

## 2018-05-27 DIAGNOSIS — C61 Malignant neoplasm of prostate: Principal | ICD-10-CM

## 2018-05-27 DIAGNOSIS — Z923 Personal history of irradiation: Principal | ICD-10-CM

## 2018-05-27 DIAGNOSIS — C7951 Secondary malignant neoplasm of bone: ICD-10-CM

## 2018-05-30 DIAGNOSIS — C61 Malignant neoplasm of prostate: Secondary | ICD-10-CM

## 2018-05-30 DIAGNOSIS — C7951 Secondary malignant neoplasm of bone: Principal | ICD-10-CM

## 2018-06-08 ENCOUNTER — Encounter: Admit: 2018-06-08 | Discharge: 2018-06-08 | Payer: MEDICARE

## 2018-06-08 DIAGNOSIS — C61 Malignant neoplasm of prostate: Principal | ICD-10-CM

## 2018-06-08 DIAGNOSIS — Z79818 Long term (current) use of other agents affecting estrogen receptors and estrogen levels: ICD-10-CM

## 2018-06-08 DIAGNOSIS — J449 Chronic obstructive pulmonary disease, unspecified: ICD-10-CM

## 2018-06-08 DIAGNOSIS — R0600 Dyspnea, unspecified: ICD-10-CM

## 2018-06-08 DIAGNOSIS — Z87891 Personal history of nicotine dependence: ICD-10-CM

## 2018-06-08 DIAGNOSIS — E039 Hypothyroidism, unspecified: ICD-10-CM

## 2018-06-08 DIAGNOSIS — I10 Essential (primary) hypertension: ICD-10-CM

## 2018-06-08 DIAGNOSIS — D649 Anemia, unspecified: ICD-10-CM

## 2018-06-08 DIAGNOSIS — C775 Secondary and unspecified malignant neoplasm of intrapelvic lymph nodes: ICD-10-CM

## 2018-06-08 DIAGNOSIS — A4902 Methicillin resistant Staphylococcus aureus infection, unspecified site: ICD-10-CM

## 2018-06-08 DIAGNOSIS — G4733 Obstructive sleep apnea (adult) (pediatric): ICD-10-CM

## 2018-06-08 DIAGNOSIS — Z006 Encounter for examination for normal comparison and control in clinical research program: ICD-10-CM

## 2018-06-08 DIAGNOSIS — J45909 Unspecified asthma, uncomplicated: ICD-10-CM

## 2018-06-08 DIAGNOSIS — N189 Chronic kidney disease, unspecified: ICD-10-CM

## 2018-06-08 DIAGNOSIS — C778 Secondary and unspecified malignant neoplasm of lymph nodes of multiple regions: ICD-10-CM

## 2018-06-08 DIAGNOSIS — I2510 Atherosclerotic heart disease of native coronary artery without angina pectoris: Principal | ICD-10-CM

## 2018-06-08 DIAGNOSIS — C7951 Secondary malignant neoplasm of bone: ICD-10-CM

## 2018-06-08 DIAGNOSIS — I214 Non-ST elevation (NSTEMI) myocardial infarction: ICD-10-CM

## 2018-06-08 DIAGNOSIS — E119 Type 2 diabetes mellitus without complications: ICD-10-CM

## 2018-06-08 DIAGNOSIS — B0229 Other postherpetic nervous system involvement: ICD-10-CM

## 2018-06-08 DIAGNOSIS — I509 Heart failure, unspecified: ICD-10-CM

## 2018-06-08 DIAGNOSIS — K219 Gastro-esophageal reflux disease without esophagitis: ICD-10-CM

## 2018-06-08 DIAGNOSIS — G893 Neoplasm related pain (acute) (chronic): ICD-10-CM

## 2018-06-08 DIAGNOSIS — E785 Hyperlipidemia, unspecified: ICD-10-CM

## 2018-06-08 DIAGNOSIS — M1990 Unspecified osteoarthritis, unspecified site: ICD-10-CM

## 2018-06-08 DIAGNOSIS — E669 Obesity, unspecified: ICD-10-CM

## 2018-06-08 LAB — CBC AND DIFF
Lab: 0.3 K/UL (ref 0–0.80)
Lab: 1 % (ref 0–2)
Lab: 26 % — ABNORMAL LOW (ref 40–50)
Lab: 3 10*3/uL — ABNORMAL LOW (ref 4.5–11.0)
Lab: 31 g/dL — ABNORMAL LOW (ref 32.0–36.0)
Lab: 7.8 FL (ref 7–11)
Lab: 9 % — ABNORMAL LOW (ref 4–12)

## 2018-06-08 LAB — COMPREHENSIVE METABOLIC PANEL
Lab: 0.6 mg/dL — ABNORMAL LOW (ref 0.3–1.2)
Lab: 0.8 mg/dL (ref 0.4–1.24)
Lab: 100 MMOL/L (ref 98–110)
Lab: 13 mg/dL — ABNORMAL HIGH (ref 7–25)
Lab: 15 U/L — ABNORMAL LOW (ref 7–56)
Lab: 3.9 MMOL/L (ref 3.5–5.1)
Lab: 31 MMOL/L — ABNORMAL HIGH (ref 21–30)
Lab: 379 U/L — ABNORMAL HIGH (ref 25–110)
Lab: 6.5 g/dL (ref 6.0–8.0)
Lab: >60 mL/min (ref >60)
Lab: >60 mL/min (ref >60)

## 2018-06-08 LAB — TESTOSTERONE,TOTAL: Lab: 22 ng/dL — ABNORMAL LOW (ref 270–1070)

## 2018-06-08 LAB — LDH-LACTATE DEHYDROGENASE: Lab: 190 U/L (ref 100–210)

## 2018-06-08 LAB — PROSTATIC SPECIFIC ANTIGEN-PSA: Lab: 0.5 ng/mL — ABNORMAL LOW (ref <4.01)

## 2018-06-08 LAB — PHOSPHORUS: Lab: 2.1 mg/dL (ref 2.0–4.5)

## 2018-06-08 MED ORDER — ENZALUTAMIDE 40 MG PO CAP
160 mg | ORAL_CAPSULE | Freq: Every day | ORAL | 3 refills | Status: AC
Start: 2018-06-08 — End: 2018-11-27
  Filled 2018-07-17 (×2): qty 120, 30d supply, fill #1

## 2018-06-08 MED ORDER — LEUPROLIDE (3 MONTH) 22.5 MG IM SYKT
22.5 mg | Freq: Once | INTRAMUSCULAR | 0 refills | Status: CP
Start: 2018-06-08 — End: ?
  Administered 2018-06-08: 15:00:00 22.5 mg via INTRAMUSCULAR

## 2018-06-08 NOTE — Progress Notes
End of Treatment Summary Note    Date:  05/27/2018    Alexander Holland is a 68 y.o. male.     There were no encounter diagnoses.  Staging: Cancer Staging  Prostate cancer [facility])  Staging form: Prostate, AJCC 7th Edition  - Clinical stage from 01/31/2016: Stage IV (T3b, N1, M1b, PSA: Less than 10, Gleason 8-10) - Signed by Ross Marcus, MD on 01/31/2016        RADIATION THERAPY TREATMENT SUMMARY:     Site Treatment Dates Technique Energy Dose/ Fraction (cGy) Total Dose (cGy) Fractions Missed Treatment Days   Left sacrum 05/21/18 - 05/27/18 3D-CRT 10X 700 3500 5 0   Concurrent systemic therapy: None    SUMMARY OF TREATMENT: Alexander Holland is a 68 year old man with metastatic castrate resistant prostate cancer.  He has pain in the left hip, which has progression of a large medial left iliac bone metastasis.  This is the only area of progression on bone scan.  A treatment was planned to treat this lesion more aggressively to 7 Gy x 5.  A MRI was used to assist treatment planning.  Unfortunately, he developed an apparent anaphylactic reaction with contrast infusion, and required resuscitation.  His treatment start was delayed, but ultimately, he was able to complete radiation.  He had improve in hip pain.    ACUTE TOXICITY: None    DISPOSITION: Alexander Holland will follow up with me in 1 month.    Harriette Bouillon, MD  Attending Physician  Department of Radiation Oncology, [facility]

## 2018-06-08 NOTE — Progress Notes
from [facility]. Since his last visit he experienced a cardiac arrest during an allergic reaction from Gadolinium contrast. He was successfully resuscitated and hospitalized for monitoring. Subsequently had COPD flare and was rehospitalized. He subsequently had palliative radiation to large medial left iliac bone metastasis.     Alexander Holland reports that he has been feeling back at his pre-arrest baseline. His L hip pain has essentially resolved and doesn't impact his ambulation or sleep. Taking only 1 dose of Hydro/APAP per day, which is at bedtime. Denies any constipation.     He has improving R rib pain after developing CPR-related rib fractures, but cough, dyspnea, and wheezing have resolved.     Denies any other new problems or concerning symptoms. Specifically, he denies any dyspnea, cough, nausea, vomiting, abdominal pain, diarrhea, constipation, or rash.            Review of Systems   HENT: Negative.    Eyes: Negative.    Respiratory: Positive for apnea (Chronic obstructive sleep apnea).    Cardiovascular: Negative.    Gastrointestinal: Negative.    Endocrine: Negative.    Musculoskeletal: Negative.    Skin: Negative.    Allergic/Immunologic: Negative.    Neurological: Negative.    Hematological: Negative.    Psychiatric/Behavioral: Negative.      Objective:      Past medical, surgical, family, and social history have been reviewed with the patient on 06/08/2018, and confirmed accuracy of the information outlined below:  Medical History:   Diagnosis Date   ??? Anemia    ??? Arthritis     DJD-Hip   ??? Asthma    ??? CAD (coronary artery disease)    ??? CHF (congestive heart failure) (HCC) 08/2015   ??? Chronic renal insufficiency    ??? COPD (chronic obstructive pulmonary disease) (HCC)    ??? DM (diabetes mellitus) (HCC)     120's,130's   ??? DOE (dyspnea on exertion)    ??? Ex-smoker    ??? GERD (gastroesophageal reflux disease)    ??? HLD (hyperlipidemia)    ??? HTN (hypertension)    ??? Hypothyroidism ??? MRSA (methicillin resistant Staphylococcus aureus)    ??? NSTEMI (non-ST elevated myocardial infarction) (HCC)     1997, 2012   ??? Obesity    ??? OSA on CPAP    ??? Postherpetic neuralgia      Surgical History:   Procedure Laterality Date   ??? CORONARY STENT PLACEMENT  1997   ??? CARDIAC SURGERY  2012    CABG   ??? CYSTOSCOPY WITH TRANSURETHRAL PROSTATECTOMY N/A 01/05/2016    Performed by Mittie Bodo, MD at [facility]   ??? TUNNELED VENOUS PORT PLACEMENT Right 06/06/2017    right EJ port placement per IR   ??? ABDOMEN SURGERY      Umbilical Hernia   ??? HX HEART CATHETERIZATION     ??? LEFT HEART CATHETERIZATION      s/p PCI ~1995     No family history on file.  Social History     Socioeconomic History   ??? Marital status: Married     Spouse name: Not on file   ??? Number of children: Not on file   ??? Years of education: Not on file   ??? Highest education level: Not on file   Occupational History   ??? Not on file   Tobacco Use   ??? Smoking status: Former Smoker     Packs/day: 1.00     Years: 30.00  Pack years: 30.00     Types: Cigarettes     Last attempt to quit: 02/01/1996     Years since quitting: 22.3   ??? Smokeless tobacco: Never Used   Substance and Sexual Activity   ??? Alcohol use: Yes     Comment: rarely   ??? Drug use: No   ??? Sexual activity: Not on file   Other Topics Concern   ??? Not on file   Social History Narrative   ??? Not on file   Alexander Holland is married and lives with his wife. 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 They have 3 children (2 daughters, 1 son).        ??? abiraterone (ZYTIGA) 500 mg tablet Take 1,000 mg by mouth daily. Take on an empty stomach, at least 2 hours before or after food.  Do not crush or chew.   ??? ACCU-CHEK SOFTCLIX LANCETS MISC Use  as directed twice daily.     ??? albuterol (VENTOLIN HFA, PROAIR HFA) 90 mcg/actuation inhaler Inhale 2 Puffs by mouth every 6 hours as needed.     ??? albuterol 0.083% (PROVENTIL; VENTOLIN) 2.5 mg /3 mL (0.083 %) nebulizer ??? ondansetron (ZOFRAN) 8 mg tablet Take one tablet by mouth every 8 hours as needed for Nausea or Vomiting. Indications: nausea and vomiting caused by cancer drugs   ??? potassium chloride SR (K-DUR) 20 mEq tablet Take one tablet by mouth daily. Take with a meal and a full glass of water.   ??? prednisone (DELTASONE) 5 mg tablet Take one tablet by mouth twice daily with meals.   ??? rivaroxaban (XARELTO) 20 mg tablet Take one tablet by mouth daily. Take with food.   ??? senna (SENOKOT) 8.6 mg tablet Take 2 tablets by mouth at bedtime as needed for Constipation.   ??? sertraline (ZOLOFT) 50 mg tablet Take 1 tablet by mouth daily.   ??? triamterene-hydrochlorothiazide (DYAZIDE) 37.5-25 mg capsule Take 1 capsule by mouth every morning.     Vitals:    06/08/18 0744   BP: 127/52   BP Source: Arm, Left Upper   Patient Position: Sitting   Pulse: 79   Resp: 20   Temp: 36.8 ???C (98.2 ???F)   TempSrc: Oral   SpO2: 95%   Weight: 99.3 kg (219 lb)   Height: 170.2 cm (67.01)   PainSc: Five     Body mass index is 34.29 kg/m???.   Pain Score: Five  Pain Loc: Rib CagePain Addressed:  Patient to call office if pain not relieved or worsened and Current regimen working to control pain.    Patient Evaluated for a Clinical Trial: No treatment clinical trial available for this patient.      [place] Cooperative Oncology Group performance status is 1, Restricted in physically strenuous activity but ambulatory and able to carry out work of a light or sedentary nature, e.g., light house work, office work.     Physical Exam  Constitutional: Well-developed, well-nourished gentleman sitting comfortably in exam room.   Eyes: EOMI, no conjunctival injection, anicteric sclerae  ENT: Nares patent, lips and oral mucosae moist, no exudates or ulcerations. Good dentition. No stridor.  CV: Normal rate, regular rhythm, no murmur, rub, or gallop.   Respiratory: Normal work of breathing on room air. Good air movement throughout chest, no wheeze, rales, or rhonchi. GI: Abdomen obese, soft, and nondistended. Bowel sounds present in all 4 quadrants. No tenderness to palpation. No hepatomegaly, spleen  tip not palpated.  GU: No catheter present  Msk: Normal muscle bulk and tone. 1+ nonpitting edema LLE.   Neuro: Cranial nerves grossly intact and symmetric. No focal neurologic deficits. Antalgic gait with a cane (cane is new).  Integument/skin: No rash visualized nor any skin eruptions palpated.  Heme/lymph: No pathologic-appearing bruising. No cervical or supraclavicular lymphdenopathy.  Psych: Alert and oriented to person, place, date, and situation. Affect is full and content. Good insight and judgement.              Data:  I have reviewed the patient's CBC/differential and compared to prior values. The full CBC is as follows:  CBC with Diff Latest Ref Rng & Units 06/08/2018 05/23/2018 05/22/2018 05/21/2018 05/20/2018   WBC 4.5 - 11.0 K/UL 3.0(L) 4.4(L) 4.2(L) 4.7 4.0(L)   RBC 4.4 - 5.5 M/UL 2.96(L) 3.25(L) 3.06(L) 2.80(L) 3.13(L)   HGB 13.5 - 16.5 GM/DL 8.2(L) 8.8(L) 8.2(L) 7.3(L) 8.3(L)   HCT 40 - 50 % 26.1(L) 28.3(L) 26.5(L) 23.9(L) 26.9(L)   MCV 80 - 100 FL 88.2 87.0 86.4 85.4 85.9   MCH 26 - 34 PG 27.6 26.9 26.8 26.2 26.4   MCHC 32.0 - 36.0 G/DL 31.3(L) 30.9(L) 31.0(L) 30.7(L) 30.8(L)   RDW 11 - 15 % 20.2(H) 20.1(H) 20.2(H) 20.0(H) 20.5(H)   PLT 150 - 400 K/UL 163 113(L) 117(L) 117(L) 114(L)   MPV 7 - 11 FL 7.8 8.1 8.1 7.9 7.9   NEUT 41 - 77 % 73 80(H) 82(H) 84(H) 91(H)   ANC 1.8 - 7.0 K/UL 2.20 3.50 3.40 3.90 3.60   LYMA 24 - 44 % 14(L) 9(L) 6(L) 5(L) 3(L)   ALYM 1.0 - 4.8 K/UL 0.40(L) 0.40(L) 0.20(L) 0.20(L) 0.10(L)   MONA 4 - 12 % 9 8 12 11 5    AMONO 0 - 0.80 K/UL 0.30 0.40 0.50 0.50 0.20   EOSA 0 - 5 % 3 3 0 0 0   AEOS 0 - 0.45 K/UL 0.10 0.10 0.00 0.00 0.00   BASA 0 - 2 % 1 0 0 0 1   ABAS 0 - 0.20 K/UL 0.00 0.00 0.00 0.00 0.00       I have reviewed the patient's CMP and compared to prior values. The full CMP is as follows: ???Continue Ferrous sulfate 325 mg PO q12h, follow up colonoscopy results from GI  ???Continue B12 1000 mg IM monthly thereafter, to be given indefinitely.     4. 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  ???No treatment indicated    5. Goals of care:  06/08/18: Stated goal is longevity.  He stated that he is willing to accept the risk of medical burden (new symptoms, hospitalizations) in pursuit of this goal.           Total face-to-face time for visit: 20 minutes, all (> 50%) of which was spent in education and counseling with patient in clinic. Discussed the patient's cancer diagnosis, management, and reviewed goals.

## 2018-06-08 NOTE — Progress Notes
[name]???) with bleach, if possible.     Contraindications / Safety Precautions / Adverse Effects:  Contraindications to therapy, safety precautions, and common adverse effects (listed below) were discussed with the patient.  I explained that most patients do NOT experience all these side effects and that this list was not inclusive.  I instructed patient to report any adverse effects to their doctor, pharmacist or nurse.     What You May Expect?  What Should You Do?    Fatigue ? Rest often? Take naps if needed. Move slowly when getting up.  ? Eat well balanced meals and drink plenty of fluids. Light exercise may help.  ? Do not drive a motor vehicle or operate machinery when feeling tired.   Flu-like symptoms such as fever, chills, headache, muscle and joint aches, stuffy or runny nose, or cough may occur.  ? Take acetaminophen (Tylenol???) 650mg  orally every 3-4 hours if needed.   ? If the fever and chills last longer than 48 hours after your dose, it could be a sign of infection. Tell your nurse or doctor immediately.      Diarrhea, May occur days to weeks after the drug is given / after treatment starts ? Drink plenty of clear fluids. Limit hot, spicy, fried foods, foods/drinks with caffeine, orange or prune juice.  ? Try a low fiber BRAT diet (Bananas, white Rice, Apple sauce, Toast made with white bread).  ? Take anti-diarrhea drug(s) if given to you by your doctor.   Increased sweating, feelings of warmth (hot flashes) which may improve over time ? Avoid triggers such as alcohol, caffeine (tea, coffee, cola), chocolate, hot and spicy food, stress, and heat.  ? Exercise regularly. Keep cool ??? dress lightly and drink plenty of water   Mild swelling in arms and legs, puffiness ? Keep your feet up when sitting, eat a low-salt diet, and avoid tight-fitting clothing   Decreased blood counts, fevers, chills, infections   Your health care providers will test your blood frequently to monitor your blood counts.  ? Report any signs of infection, fever, and unusual bleeding or bruising.   ? Phone your doctor right away or go to the nearest emergency department, if your oral temperature is over 38???C or 100.4???F (unless stated otherwise by your healthcare team). Tell the healthcare team that you are on hormonal therapy.  ? Check your temperature, especially if you are feeling unwell with sweats, fever or chills. Take your temperature before using acetaminophen (Tylenol???), since it may mask fever.  ? Check with your doctor before getting any vaccines.     Vaccination Status Education     The patient was reminded about the importance of receiving an annual influenza vaccine as indicated.    REMS Program:  No REMS is required for this medication.    Drug-Drug Interactions:  A medication history and reconciliation was performed (including prescription medications, supplements, over the counter medications, and herbal products). The medication list was updated and the patient???s current medication list is included below.  I stressed the importance of maintaining an accurate medication list and informing their medical team prior to taking any new medications.     Home Medications    Medication Sig   ACCU-CHEK SOFTCLIX LANCETS MISC Use  as directed twice daily.     albuterol (VENTOLIN HFA, PROAIR HFA) 90 mcg/actuation inhaler Inhale 2 Puffs by mouth every 6 hours as needed.     albuterol 0.083% (PROVENTIL; VENTOLIN) 2.5 mg /3 mL (0.083 %)  nebulizer solution Inhale 3 mL solution by nebulizer as directed every 4 hours as needed for Wheezing or Shortness of Breath.   aspirin EC 81 mg tablet Take 81 mg by mouth at bedtime daily. Take with food.   atorvastatin (LIPITOR) 80 mg tablet Take 80 mg by mouth at bedtime daily.   blood sugar diagnostic (ACCU-CHEK AVIVA PLUS) test strip 1 Strip by Test route before meals and at bedtime.     carvedilol (COREG) 3.125 mg tablet Take one tablet by mouth twice daily calls were not answered, the specialty pharmacy would not ship the medication.    This medication is considered low risk per our internal oral chemotherapy risk categorization and the patient will be contacted for education, toxicity check at 2 weeks, and reassessment annually, if applicable (low risk monitoring).        Patient was given the opportunity to ask questions. Patient verbalized understanding, agreed with the plan and had no questions or concerns regarding therapy.     Manley Mason, [place]  Clinical Pharmacist  06/08/18

## 2018-06-09 ENCOUNTER — Encounter: Admit: 2018-06-09 | Discharge: 2018-06-09 | Payer: MEDICARE

## 2018-06-09 MED ORDER — EDOXABAN 60 MG PO TAB
60 mg | ORAL_TABLET | Freq: Every day | ORAL | 0 refills | Status: AC
Start: 2018-06-09 — End: 2018-08-17
  Filled 2018-06-11: qty 30, 30d supply

## 2018-06-09 NOTE — Progress Notes
The Prior Authorization for Alexander Holland was submitted for [name] via CMM.  Will continue to follow.    Mont Dutton  Pharmacy Patient Advocate  [phone]

## 2018-06-09 NOTE — Progress Notes
Initial Assessment: Oral Chemotherapy  Enzalutamide (Xtandi???)    Alexander Holland is a 68 y.o. male with a diagnosis of prostate cancer.    Indication/Regimen    Enzalutamide Alexander Holland???) is being used appropriately for treatment of prostate cancer.     The dosing regimen of 160 mg (four 40mg  capsules) by mouth once daily is appropriate for Alexander Holland. It is planned to continue until progression or unacceptable toxicity.      Patient History:   Cancer Diagnosis: Prostate cancer   Relevant drug-specific markers: PSA 0.54     Past Treatment Plans    ONCOLOGY 1   Plan Name Cycles Start Date Discontinue Date Discontinue Reason Discontinue User    (INV) HSC [id number]; [phone]; ARM A:  CABAZITAXEL + ABIRATERONE + PREDNISONE 7 of 12 cycles planned 06/09/2017  06/08/2018 Progression Alexander Holland, Alexander Must, MD    OP PROSTATE ABIRATERONE + PREDNISONE Treatment not started 05/26/2017  06/02/2017 Other Alexander Holland, Alexander Must, MD    OP GU DOCETAXEL 6 of 6 cycles started 02/19/2016  02/14/2017 Therapy Complete Alexander Holland, PHARMD          Wt Readings from Last 1 Encounters:   06/08/18 99.3 kg (219 lb)        Estimated body surface area is 2.17 meters squared as calculated from the following:    Height as of 06/08/18: 170.2 cm (67.01).    Weight as of 06/08/18: 99.3 kg (219 lb).    Allergies:  Allergies   Allergen Reactions   ??? Gadolinium-Containing Contrast Media ANAPHYLAXIS     Pt coded after receiving contrast medium in MRI- If Contrast MRI study needed, pt Holland be scheduled at main campus with pre-medications and radiologist recomendations   ??? Multihance [Gadobenate Dimeglumine] ANAPHYLAXIS     Pt coded after receiving contrast medium in MRI- If Contrast MRI study needed, pt Holland be scheduled at main campus with pre-medications and radiologist recomendations       Baseline Labs:     CBC w/Diff    Lab Results   Component Value Date/Time    WBC 3.0 (L) 06/08/2018 07:20 AM    RBC 2.96 (L) 06/08/2018 07:20 AM minutes as needed.     omeprazole DR(+) (PRILOSEC) 20 mg capsule Take 20 mg by mouth daily.     ondansetron (ZOFRAN) 8 mg tablet Take one tablet by mouth every 8 hours as needed for Nausea or Vomiting. Indications: nausea and vomiting caused by cancer drugs   potassium chloride SR (K-DUR) 20 mEq tablet Take one tablet by mouth daily. Take with a meal and a full glass of water.   rivaroxaban (XARELTO) 20 mg tablet Take one tablet by mouth daily. Take with food.   senna (SENOKOT) 8.6 mg tablet Take 2 tablets by mouth at bedtime as needed for Constipation.   sertraline (ZOLOFT) 50 mg tablet Take 1 tablet by mouth daily.   triamterene-hydrochlorothiazide (DYAZIDE) 37.5-25 mg capsule Take 1 capsule by mouth every morning.       Medication reconciliation is based on the patient???s most recent medication list in the electronic medical record (EMR) including herbal products and OTC medications. The patient's medication list will be updated during patient education, after speaking with the patient and prior to dispensing the medication.     Drug-drug interactions (DDIs)    DDIs were evaluated: The following drug-drug interactions were identified: rivaroxaban, omeprazole, ondansetron, and atorvastatin. Enzalutamide may decrease the concentrations of the listed medications. Discussed with Dr. Lura Em and will change anticoagulation  from rivaroxaban to rather edoxaban or enoxaparin pending on cost. As for the interactions with omeprazole, ondansetron, and atorvastatin - will monitor for signs of decreased efficacy.    Drug-Food Interactions    Drug-food interactions were evaluated.  Doses with or without food at the same time each day.  Swallow capsules whole; do not chew, dissolve, or open the capsules.     Contraindications    No contraindications to therapy were identified as there are no contraindications in the prescribing information.     Vaccination Status Assessment     Immunization History

## 2018-06-10 ENCOUNTER — Encounter: Admit: 2018-06-10 | Discharge: 2018-06-10 | Payer: MEDICARE

## 2018-06-10 NOTE — Telephone Encounter
[facility] pharmacy informed PA was approved for [employer]. Reports cost will be $128.50.

## 2018-06-11 ENCOUNTER — Encounter: Admit: 2018-06-11 | Discharge: 2018-06-11 | Payer: MEDICARE

## 2018-06-12 ENCOUNTER — Encounter: Admit: 2018-06-12 | Discharge: 2018-06-12 | Payer: MEDICARE

## 2018-06-15 ENCOUNTER — Encounter: Admit: 2018-06-15 | Discharge: 2018-06-15 | Payer: MEDICARE

## 2018-06-16 ENCOUNTER — Encounter: Admit: 2018-06-16 | Discharge: 2018-06-16 | Payer: MEDICARE

## 2018-06-17 ENCOUNTER — Encounter: Admit: 2018-06-17 | Discharge: 2018-06-17 | Payer: MEDICARE

## 2018-06-24 ENCOUNTER — Encounter: Admit: 2018-06-24 | Discharge: 2018-06-24 | Payer: MEDICARE

## 2018-06-24 DIAGNOSIS — C61 Malignant neoplasm of prostate: Principal | ICD-10-CM

## 2018-06-26 ENCOUNTER — Encounter: Admit: 2018-06-26 | Discharge: 2018-06-26 | Payer: MEDICARE

## 2018-06-26 DIAGNOSIS — C61 Malignant neoplasm of prostate: Principal | ICD-10-CM

## 2018-06-26 NOTE — Progress Notes
The medication assistance application for Xtandi  for ATANACIO MELNYK is still pending review. Will continue to follow.     Mont Dutton  Pharmacy Patient Advocate  [phone]

## 2018-06-30 ENCOUNTER — Encounter: Admit: 2018-06-30 | Discharge: 2018-06-30 | Payer: MEDICARE

## 2018-07-02 ENCOUNTER — Encounter: Admit: 2018-07-02 | Discharge: 2018-07-02 | Payer: MEDICARE

## 2018-07-02 DIAGNOSIS — C61 Malignant neoplasm of prostate: ICD-10-CM

## 2018-07-02 DIAGNOSIS — G893 Neoplasm related pain (acute) (chronic): Principal | ICD-10-CM

## 2018-07-02 MED ORDER — HYDROCODONE-ACETAMINOPHEN 10-325 MG PO TAB
1 | ORAL_TABLET | ORAL | 0 refills | 30.00000 days | Status: AC | PRN
Start: 2018-07-02 — End: 2018-08-14

## 2018-07-06 ENCOUNTER — Encounter: Admit: 2018-07-06 | Discharge: 2018-07-06 | Payer: MEDICARE

## 2018-07-07 ENCOUNTER — Encounter: Admit: 2018-07-07 | Discharge: 2018-07-07 | Payer: MEDICARE

## 2018-07-10 ENCOUNTER — Encounter: Admit: 2018-07-10 | Discharge: 2018-07-10 | Payer: MEDICARE

## 2018-07-13 ENCOUNTER — Encounter: Admit: 2018-07-13 | Discharge: 2018-07-13 | Payer: MEDICARE

## 2018-07-14 ENCOUNTER — Encounter: Admit: 2018-07-14 | Discharge: 2018-07-14 | Payer: MEDICARE

## 2018-07-15 ENCOUNTER — Encounter: Admit: 2018-07-15 | Discharge: 2018-07-15 | Payer: MEDICARE

## 2018-07-17 ENCOUNTER — Encounter: Admit: 2018-07-17 | Discharge: 2018-07-17 | Payer: MEDICARE

## 2018-07-20 ENCOUNTER — Encounter: Admit: 2018-07-20 | Discharge: 2018-07-20 | Payer: MEDICARE

## 2018-07-20 DIAGNOSIS — C7951 Secondary malignant neoplasm of bone: ICD-10-CM

## 2018-07-20 DIAGNOSIS — C61 Malignant neoplasm of prostate: Principal | ICD-10-CM

## 2018-07-20 DIAGNOSIS — C778 Secondary and unspecified malignant neoplasm of lymph nodes of multiple regions: ICD-10-CM

## 2018-07-21 ENCOUNTER — Encounter: Admit: 2018-07-21 | Discharge: 2018-07-21 | Payer: MEDICARE

## 2018-07-21 MED FILL — EDOXABAN 60 MG PO TAB: 60 mg | ORAL | 30 days supply | Qty: 30 | Fill #1 | Status: AC

## 2018-07-22 ENCOUNTER — Encounter: Admit: 2018-07-22 | Discharge: 2018-07-22 | Payer: MEDICARE

## 2018-07-22 DIAGNOSIS — E119 Type 2 diabetes mellitus without complications: ICD-10-CM

## 2018-07-22 DIAGNOSIS — E039 Hypothyroidism, unspecified: ICD-10-CM

## 2018-07-22 DIAGNOSIS — I2510 Atherosclerotic heart disease of native coronary artery without angina pectoris: Principal | ICD-10-CM

## 2018-07-22 DIAGNOSIS — Z87891 Personal history of nicotine dependence: ICD-10-CM

## 2018-07-22 DIAGNOSIS — E669 Obesity, unspecified: ICD-10-CM

## 2018-07-22 DIAGNOSIS — Z7901 Long term (current) use of anticoagulants: ICD-10-CM

## 2018-07-22 DIAGNOSIS — I509 Heart failure, unspecified: ICD-10-CM

## 2018-07-22 DIAGNOSIS — E785 Hyperlipidemia, unspecified: ICD-10-CM

## 2018-07-22 DIAGNOSIS — C61 Malignant neoplasm of prostate: Principal | ICD-10-CM

## 2018-07-22 DIAGNOSIS — D649 Anemia, unspecified: ICD-10-CM

## 2018-07-22 DIAGNOSIS — A4902 Methicillin resistant Staphylococcus aureus infection, unspecified site: ICD-10-CM

## 2018-07-22 DIAGNOSIS — J449 Chronic obstructive pulmonary disease, unspecified: ICD-10-CM

## 2018-07-22 DIAGNOSIS — R0600 Dyspnea, unspecified: ICD-10-CM

## 2018-07-22 DIAGNOSIS — I10 Essential (primary) hypertension: ICD-10-CM

## 2018-07-22 DIAGNOSIS — I214 Non-ST elevation (NSTEMI) myocardial infarction: ICD-10-CM

## 2018-07-22 DIAGNOSIS — K219 Gastro-esophageal reflux disease without esophagitis: ICD-10-CM

## 2018-07-22 DIAGNOSIS — C7951 Secondary malignant neoplasm of bone: ICD-10-CM

## 2018-07-22 DIAGNOSIS — J45909 Unspecified asthma, uncomplicated: ICD-10-CM

## 2018-07-22 DIAGNOSIS — C778 Secondary and unspecified malignant neoplasm of lymph nodes of multiple regions: ICD-10-CM

## 2018-07-22 DIAGNOSIS — T782XXA Anaphylactic shock, unspecified, initial encounter: ICD-10-CM

## 2018-07-22 DIAGNOSIS — M1990 Unspecified osteoarthritis, unspecified site: ICD-10-CM

## 2018-07-22 DIAGNOSIS — N189 Chronic kidney disease, unspecified: ICD-10-CM

## 2018-07-22 DIAGNOSIS — G4733 Obstructive sleep apnea (adult) (pediatric): ICD-10-CM

## 2018-07-22 DIAGNOSIS — B0229 Other postherpetic nervous system involvement: ICD-10-CM

## 2018-07-22 LAB — COMPREHENSIVE METABOLIC PANEL
Lab: 1 mg/dL — ABNORMAL HIGH (ref 0.4–1.24)
Lab: 12 10*3/uL — ABNORMAL LOW (ref 3–12)
Lab: 14 U/L (ref 7–56)
Lab: 3.4 g/dL — ABNORMAL LOW (ref 3.5–5.0)
Lab: 4 MMOL/L — ABNORMAL LOW (ref >60)
Lab: 7 g/dL — ABNORMAL HIGH (ref 6.0–8.0)
Lab: >60 mL/min (ref >60)

## 2018-07-22 LAB — CBC AND DIFF: Lab: 0.1 10*3/uL (ref 0–0.20)

## 2018-07-22 LAB — TESTOSTERONE,TOTAL: Lab: <10 ng/dL — ABNORMAL LOW (ref 270–1070)

## 2018-07-31 ENCOUNTER — Encounter: Admit: 2018-07-31 | Discharge: 2018-07-31 | Payer: MEDICARE

## 2018-07-31 NOTE — Progress Notes
ALKPHOS 268 (H) 07/22/2018 07:52 AM    AST 35 07/22/2018 07:52 AM    ALT 14 07/22/2018 07:52 AM    TOTBILI 0.7 07/22/2018 07:52 AM    GFR >60 07/22/2018 07:52 AM    GFRAA >60 07/22/2018 07:52 AM              Serum creatinine: 1.02 mg/dL 16/10/96 0454  Estimated creatinine clearance: 78.5 mL/min      Adverse Effects Assessment     Alexander Holland is not experiencing any significant adverse effects to this medication regimen.     Interaction Check    Alexander Holland reports the following medication changes since the last medication history during their education visit: stopped Xarelto and started Edoxaban    Drug-drug and drug-food interactions between the patients??? specialty medication and his medication list were assessed.     The following drug-drug interactions were identified: Enzalutamide may decrease the concentration of Zofran, Omeprazole, and Atorvastatin and they will be managed by monitoring for decreased efficacy of the interactive medications.     Alexander Holland was instructed to speak with his health care provider and/or the oral chemotherapy pharmacist before starting any new drug, including prescription or over the counter, natural / herbal products, or vitamins.     Risk Evaluation and Mitigation Strategy (REMS) Assessment    No REMS is required for this medication.    Followup Plan     Alexander Holland was encouraged to call the oral chemotherapy pharmacist at [phone] with questions.   Re-assessment has been completed. Next reassessment planned for 1 year(s).     Minda Meo, [facility]  Oncology Clinical Pharmacist  07/31/2018

## 2018-08-12 ENCOUNTER — Encounter: Admit: 2018-08-12 | Discharge: 2018-08-12 | Payer: MEDICARE

## 2018-08-12 MED ORDER — GABAPENTIN 100 MG PO CAP
ORAL_CAPSULE | Freq: Two times a day (BID) | 0 refills | Status: DC
Start: 2018-08-12 — End: 2018-08-31

## 2018-08-12 MED ORDER — METHYLPREDNISOLONE 4 MG PO DSPK
ORAL_TABLET | 0 refills | Status: DC
Start: 2018-08-12 — End: 2018-10-12

## 2018-08-12 NOTE — Telephone Encounter
Pt called to report he is having increase in pain in his left hip that moves down his toes and also includes numbness. He notes he feels like someone stomped on his foot. He also reports his shin is tender to touch.  Pt not using Hydrocodone very regularly due to fear of being a dope addict. Dr Lura Em updated. Reassured pt we want him to use Hydrocodone more regularly and keep a diary, we will add in a Medrol dose pack and gabapentin. Instructed pt steroid will increase his blood sugar and to watch it closely. Also instructed to take steroid in morning as it can make his have trouble sleeping.  Will call pt Friday for update. Pt verbalizes understanding.

## 2018-08-14 ENCOUNTER — Encounter: Admit: 2018-08-14 | Discharge: 2018-08-14 | Payer: MEDICARE

## 2018-08-14 DIAGNOSIS — G893 Neoplasm related pain (acute) (chronic): Principal | ICD-10-CM

## 2018-08-14 DIAGNOSIS — C61 Malignant neoplasm of prostate: ICD-10-CM

## 2018-08-14 MED ORDER — HYDROCODONE-ACETAMINOPHEN 10-325 MG PO TAB
1 | ORAL_TABLET | ORAL | 0 refills | 15.00000 days | Status: DC | PRN
Start: 2018-08-14 — End: 2018-08-31

## 2018-08-17 ENCOUNTER — Encounter: Admit: 2018-08-17 | Discharge: 2018-08-17 | Payer: MEDICARE

## 2018-08-17 MED ORDER — EDOXABAN 60 MG PO TAB
60 mg | ORAL_TABLET | Freq: Every day | ORAL | 3 refills | Status: DC
Start: 2018-08-17 — End: 2018-12-03
  Filled 2018-08-17 (×2): qty 30, 30d supply, fill #1

## 2018-08-17 MED FILL — ENZALUTAMIDE 40 MG PO CAP: 40 mg | ORAL | 30 days supply | Qty: 120 | Fill #2 | Status: AC

## 2018-08-21 ENCOUNTER — Encounter: Admit: 2018-08-21 | Discharge: 2018-08-21 | Payer: MEDICARE

## 2018-08-21 NOTE — Telephone Encounter
Faxed received from [employer] [facility] requesting CMN be filled out.  Patient has not been seen in clinic.  Spoke with Roderic Ovens at [employer].  She will contact patient's PCP.  Einar Crow, RN

## 2018-08-27 ENCOUNTER — Ambulatory Visit: Admit: 2018-08-27 | Discharge: 2018-08-27 | Payer: MEDICARE

## 2018-08-27 ENCOUNTER — Encounter: Admit: 2018-08-27 | Discharge: 2018-08-27 | Payer: MEDICARE

## 2018-08-27 DIAGNOSIS — M1990 Unspecified osteoarthritis, unspecified site: ICD-10-CM

## 2018-08-27 DIAGNOSIS — E669 Obesity, unspecified: ICD-10-CM

## 2018-08-27 DIAGNOSIS — I2510 Atherosclerotic heart disease of native coronary artery without angina pectoris: Principal | ICD-10-CM

## 2018-08-27 DIAGNOSIS — E1140 Type 2 diabetes mellitus with diabetic neuropathy, unspecified: Principal | ICD-10-CM

## 2018-08-27 DIAGNOSIS — R0600 Dyspnea, unspecified: ICD-10-CM

## 2018-08-27 DIAGNOSIS — E785 Hyperlipidemia, unspecified: ICD-10-CM

## 2018-08-27 DIAGNOSIS — E782 Mixed hyperlipidemia: ICD-10-CM

## 2018-08-27 DIAGNOSIS — N189 Chronic kidney disease, unspecified: ICD-10-CM

## 2018-08-27 DIAGNOSIS — G4733 Obstructive sleep apnea (adult) (pediatric): ICD-10-CM

## 2018-08-27 DIAGNOSIS — E119 Type 2 diabetes mellitus without complications: ICD-10-CM

## 2018-08-27 DIAGNOSIS — Z87891 Personal history of nicotine dependence: ICD-10-CM

## 2018-08-27 DIAGNOSIS — K219 Gastro-esophageal reflux disease without esophagitis: ICD-10-CM

## 2018-08-27 DIAGNOSIS — I509 Heart failure, unspecified: ICD-10-CM

## 2018-08-27 DIAGNOSIS — I10 Essential (primary) hypertension: ICD-10-CM

## 2018-08-27 DIAGNOSIS — D649 Anemia, unspecified: ICD-10-CM

## 2018-08-27 DIAGNOSIS — J45909 Unspecified asthma, uncomplicated: ICD-10-CM

## 2018-08-27 DIAGNOSIS — E039 Hypothyroidism, unspecified: ICD-10-CM

## 2018-08-27 DIAGNOSIS — B0229 Other postherpetic nervous system involvement: ICD-10-CM

## 2018-08-27 DIAGNOSIS — J449 Chronic obstructive pulmonary disease, unspecified: ICD-10-CM

## 2018-08-27 DIAGNOSIS — A4902 Methicillin resistant Staphylococcus aureus infection, unspecified site: ICD-10-CM

## 2018-08-27 DIAGNOSIS — I214 Non-ST elevation (NSTEMI) myocardial infarction: ICD-10-CM

## 2018-08-27 MED ORDER — JARDIANCE 10 MG PO TAB
10 mg | ORAL_TABLET | Freq: Every day | ORAL | 11 refills | Status: DC
Start: 2018-08-27 — End: 2018-10-12

## 2018-08-27 NOTE — Progress Notes
Social history: Retired.  Previously worked at a [employer].  Married.  Previously smoked.  Quit in 1997.       Review of Systems   Constitutional: Negative for activity change, appetite change, fatigue and unexpected weight change.   HENT: Negative for dental problem and trouble swallowing.    Eyes: Negative for visual disturbance.   Respiratory: Negative for shortness of breath.    Cardiovascular: Negative for chest pain and leg swelling.   Gastrointestinal: Negative for constipation, diarrhea, nausea and vomiting.   Endocrine: Negative for cold intolerance, heat intolerance, polydipsia, polyphagia and polyuria.   Genitourinary: Negative for difficulty urinating and dysuria.   Skin: Negative for wound.   Neurological: Positive for numbness.   Psychiatric/Behavioral: Negative for dysphoric mood and sleep disturbance. The patient is not nervous/anxious.          Objective:         ??? ACCU-CHEK SOFTCLIX LANCETS MISC Use  as directed twice daily.     ??? albuterol (VENTOLIN HFA, PROAIR HFA) 90 mcg/actuation inhaler Inhale 2 Puffs by mouth every 6 hours as needed.     ??? albuterol 0.083% (PROVENTIL; VENTOLIN) 2.5 mg /3 mL (0.083 %) nebulizer solution Inhale 3 mL solution by nebulizer as directed every 4 hours as needed for Wheezing or Shortness of Breath.   ??? aspirin EC 81 mg tablet Take 81 mg by mouth at bedtime daily. Take with food.   ??? atorvastatin (LIPITOR) 80 mg tablet Take 80 mg by mouth at bedtime daily.   ??? blood sugar diagnostic (ACCU-CHEK AVIVA PLUS) test strip 1 Strip by Test route before meals and at bedtime.     ??? carvedilol (COREG) 3.125 mg tablet Take one tablet by mouth twice daily with meals. Take with food.   ??? cholecalciferol (VITAMIN D-3) 1,000 units tablet Take 1,000 Units by mouth twice daily.   ??? edoxaban (SAVAYSA) 60 mg tab tablet Take one tablet by mouth daily.   ??? empagliflozin (JARDIANCE) 10 mg tablet Take one tablet by mouth daily.

## 2018-08-30 NOTE — Progress Notes
Enzalutamide for metastatic, castrate-resistant prostate cancer.     Alexander Holland reports that his left leg pain is not well-controlled. He describes this as anterior L leg pain beginning at the knee and radiation inferiorly. This is exacerbated by walking. He has been taking Hydrocodone/APAP 10/325 mg 2-3 times per day and doesn't think it provides significant benefit. L hip pain has not recurred.     He has paresthesias in the toes of both feet, which is a subtle change from previously, when he just had paresthesias over his L foot.     Denies any constipation.     Denies any other new problems or concerning symptoms. Specifically, he denies any dyspnea, cough, nausea, vomiting, abdominal pain, diarrhea, constipation, or rash.            Review of Systems   HENT: Negative.    Eyes: Negative.    Respiratory: Positive for apnea (Chronic obstructive sleep apnea).    Cardiovascular: Negative.    Gastrointestinal: Negative.    Endocrine: Negative.    Musculoskeletal: Negative.    Skin: Negative.    Allergic/Immunologic: Negative.    Neurological: Negative.    Hematological: Negative.    Psychiatric/Behavioral: Negative.      Objective:      Past medical, surgical, family, and social history have been reviewed with the patient on 08/31/2018, and confirmed accuracy of the information outlined below:  Medical History:   Diagnosis Date   ??? Anemia    ??? Arthritis     DJD-Hip   ??? Asthma    ??? CAD (coronary artery disease)    ??? CHF (congestive heart failure) (HCC) 08/2015   ??? Chronic renal insufficiency    ??? COPD (chronic obstructive pulmonary disease) (HCC)    ??? DM (diabetes mellitus) (HCC)     120's,130's   ??? DOE (dyspnea on exertion)    ??? Ex-smoker    ??? GERD (gastroesophageal reflux disease)    ??? HLD (hyperlipidemia)    ??? HTN (hypertension)    ??? Hypothyroidism    ??? MRSA (methicillin resistant Staphylococcus aureus)    ??? NSTEMI (non-ST elevated myocardial infarction) (HCC)     1997, 2012   ??? Obesity    ??? OSA on CPAP ??? methylPREDNIsolone (MEDROL DOSEPAK) 4 mg tablet Take medication as directed on package for 6 days. Take with food.   ??? nitroglycerin (NITROSTAT) 0.4 mg tablet Place 0.4 mg under tongue every 5 minutes as needed.     ??? omeprazole DR(+) (PRILOSEC) 20 mg capsule Take 20 mg by mouth daily.     ??? ondansetron (ZOFRAN) 8 mg tablet Take one tablet by mouth every 8 hours as needed for Nausea or Vomiting. Indications: nausea and vomiting caused by cancer drugs   ??? senna (SENOKOT) 8.6 mg tablet Take 2 tablets by mouth at bedtime as needed for Constipation.   ??? sertraline (ZOLOFT) 50 mg tablet Take 1 tablet by mouth daily.   ??? triamterene-hydrochlorothiazide (DYAZIDE) 37.5-25 mg capsule Take 1 capsule by mouth every morning.     Vitals:    08/31/18 0819 08/31/18 0820   BP: 115/41    BP Source: Arm, Left Upper    Patient Position: Sitting    Pulse: 74    Resp: 16    Temp: 36.2 ???C (97.1 ???F)    TempSrc: Temporal    SpO2: 94%    Weight: 100.1 kg (220 lb 9.6 oz)    Height: 170.2 cm (67.01)    PainSc:  Five  Body mass index is 34.54 kg/m???.   Pain Score: Five   Pain Addressed:  Patient to call office if pain not relieved or worsened and Current regimen working to control pain.    Patient Evaluated for a Clinical Trial: No treatment clinical trial available for this patient.      [place] Cooperative Oncology Group performance status is 1, Restricted in physically strenuous activity but ambulatory and able to carry out work of a light or sedentary nature, e.g., light house work, office work.     Physical Exam  Constitutional: Well-developed, well-nourished gentleman sitting comfortably in exam room.   Eyes: EOMI, no conjunctival injection, anicteric sclerae  ENT: Nares patent, lips and oral mucosae moist, no exudates or ulcerations. Good dentition. No stridor.  CV: Normal rate, regular rhythm, no murmur, rub, or gallop.   Respiratory: Normal work of breathing on room air. Good air movement

## 2018-08-31 ENCOUNTER — Encounter: Admit: 2018-08-31 | Discharge: 2018-08-31 | Payer: MEDICARE

## 2018-08-31 DIAGNOSIS — C775 Secondary and unspecified malignant neoplasm of intrapelvic lymph nodes: ICD-10-CM

## 2018-08-31 DIAGNOSIS — J449 Chronic obstructive pulmonary disease, unspecified: ICD-10-CM

## 2018-08-31 DIAGNOSIS — I509 Heart failure, unspecified: ICD-10-CM

## 2018-08-31 DIAGNOSIS — M5416 Radiculopathy, lumbar region: ICD-10-CM

## 2018-08-31 DIAGNOSIS — E669 Obesity, unspecified: ICD-10-CM

## 2018-08-31 DIAGNOSIS — D649 Anemia, unspecified: ICD-10-CM

## 2018-08-31 DIAGNOSIS — I214 Non-ST elevation (NSTEMI) myocardial infarction: ICD-10-CM

## 2018-08-31 DIAGNOSIS — Z79818 Long term (current) use of other agents affecting estrogen receptors and estrogen levels: ICD-10-CM

## 2018-08-31 DIAGNOSIS — G893 Neoplasm related pain (acute) (chronic): ICD-10-CM

## 2018-08-31 DIAGNOSIS — C61 Malignant neoplasm of prostate: Principal | ICD-10-CM

## 2018-08-31 DIAGNOSIS — M1990 Unspecified osteoarthritis, unspecified site: ICD-10-CM

## 2018-08-31 DIAGNOSIS — K219 Gastro-esophageal reflux disease without esophagitis: ICD-10-CM

## 2018-08-31 DIAGNOSIS — D630 Anemia in neoplastic disease: ICD-10-CM

## 2018-08-31 DIAGNOSIS — B0229 Other postherpetic nervous system involvement: ICD-10-CM

## 2018-08-31 DIAGNOSIS — E785 Hyperlipidemia, unspecified: ICD-10-CM

## 2018-08-31 DIAGNOSIS — I10 Essential (primary) hypertension: ICD-10-CM

## 2018-08-31 DIAGNOSIS — E538 Deficiency of other specified B group vitamins: ICD-10-CM

## 2018-08-31 DIAGNOSIS — E162 Hypoglycemia, unspecified: ICD-10-CM

## 2018-08-31 DIAGNOSIS — E039 Hypothyroidism, unspecified: ICD-10-CM

## 2018-08-31 DIAGNOSIS — G4733 Obstructive sleep apnea (adult) (pediatric): ICD-10-CM

## 2018-08-31 DIAGNOSIS — J45909 Unspecified asthma, uncomplicated: ICD-10-CM

## 2018-08-31 DIAGNOSIS — I2510 Atherosclerotic heart disease of native coronary artery without angina pectoris: Principal | ICD-10-CM

## 2018-08-31 DIAGNOSIS — N189 Chronic kidney disease, unspecified: ICD-10-CM

## 2018-08-31 DIAGNOSIS — C7951 Secondary malignant neoplasm of bone: ICD-10-CM

## 2018-08-31 DIAGNOSIS — R0600 Dyspnea, unspecified: ICD-10-CM

## 2018-08-31 DIAGNOSIS — Z87891 Personal history of nicotine dependence: ICD-10-CM

## 2018-08-31 DIAGNOSIS — E119 Type 2 diabetes mellitus without complications: ICD-10-CM

## 2018-08-31 DIAGNOSIS — A4902 Methicillin resistant Staphylococcus aureus infection, unspecified site: ICD-10-CM

## 2018-08-31 LAB — COMPREHENSIVE METABOLIC PANEL
Lab: 0.6 mg/dL — ABNORMAL LOW (ref 0.3–1.2)
Lab: 1.1 mg/dL (ref 0.4–1.24)
Lab: 12 U/L — ABNORMAL LOW (ref 7–56)
Lab: 139 MMOL/L — ABNORMAL LOW (ref 137–147)
Lab: 17 mg/dL — ABNORMAL HIGH (ref 7–25)
Lab: 193 U/L — ABNORMAL HIGH (ref 25–110)
Lab: 24 U/L (ref 7–40)
Lab: 3.6 g/dL (ref 3.5–5.0)
Lab: 3.7 MMOL/L — ABNORMAL LOW (ref 3.5–5.1)
Lab: 32 MMOL/L — ABNORMAL HIGH (ref 21–30)
Lab: 59 mg/dL — ABNORMAL LOW (ref >60)
Lab: 7.3 g/dL (ref 6.0–8.0)
Lab: 9 10*3/uL (ref 3–12)
Lab: 9.4 mg/dL (ref 8.5–10.6)
Lab: 98 MMOL/L — ABNORMAL LOW (ref >60)
Lab: >60 mL/min (ref >60)
Lab: >60 mL/min — ABNORMAL HIGH (ref >60)

## 2018-08-31 LAB — TESTOSTERONE,TOTAL: Lab: 12 ng/dL — ABNORMAL LOW (ref 270–1070)

## 2018-08-31 LAB — POC GLUCOSE: Lab: 78 mg/dL (ref 70–100)

## 2018-08-31 MED ORDER — LEUPROLIDE (3 MONTH) 22.5 MG IM SYKT
22.5 mg | Freq: Once | INTRAMUSCULAR | 0 refills | Status: CP
Start: 2018-08-31 — End: ?
  Administered 2018-08-31: 15:00:00 22.5 mg via INTRAMUSCULAR

## 2018-08-31 MED ORDER — OXYCODONE 10 MG PO TAB
10-15 mg | ORAL_TABLET | ORAL | 0 refills | 6.00000 days | Status: DC | PRN
Start: 2018-08-31 — End: 2018-12-11

## 2018-08-31 MED ORDER — GABAPENTIN 600 MG PO TAB
600 mg | ORAL_TABLET | ORAL | 3 refills | Status: DC
Start: 2018-08-31 — End: 2019-02-15

## 2018-09-15 ENCOUNTER — Encounter: Admit: 2018-09-15 | Discharge: 2018-09-15

## 2018-09-16 ENCOUNTER — Encounter: Admit: 2018-09-16 | Discharge: 2018-09-16

## 2018-09-16 MED FILL — ENZALUTAMIDE 40 MG PO CAP: 40 mg | ORAL | 30 days supply | Qty: 120 | Fill #3 | Status: AC

## 2018-09-16 MED FILL — EDOXABAN 60 MG PO TAB: 60 mg | ORAL | 30 days supply | Qty: 30 | Fill #2 | Status: AC

## 2018-09-17 ENCOUNTER — Encounter: Admit: 2018-09-17 | Discharge: 2018-09-17

## 2018-09-18 ENCOUNTER — Encounter: Admit: 2018-09-18 | Discharge: 2018-09-18

## 2018-09-23 ENCOUNTER — Encounter: Admit: 2018-09-23 | Discharge: 2018-09-23

## 2018-10-02 IMAGING — US VDUPLELT
1 series · 14 of 25 positions shown · non-contrast
Comparison: none

[Series 1: us venous duplex low ext left · portal-venous · 14 of 34 slices shown]
[im 1/34]
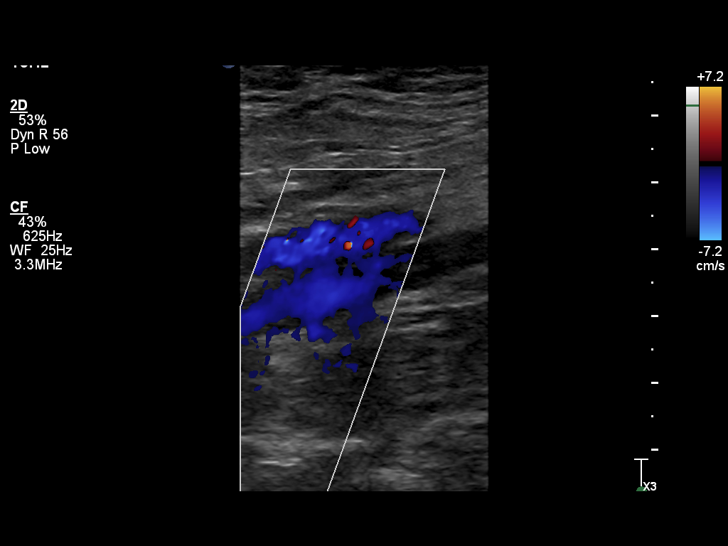
[im 3/34]
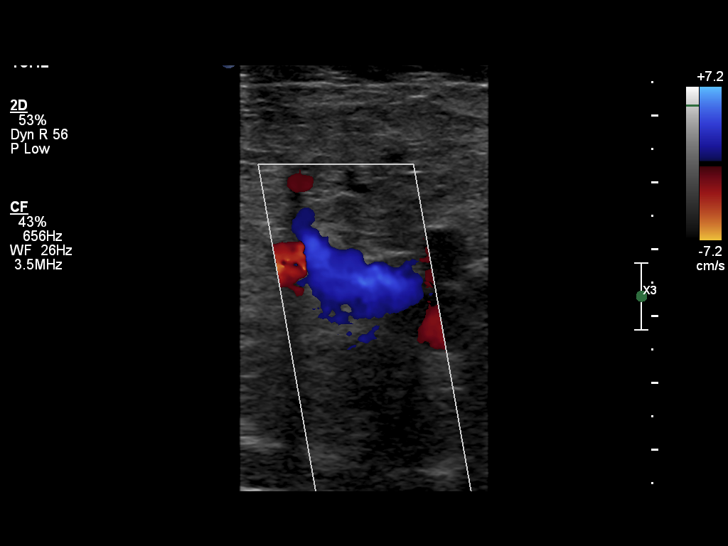
[im 6/34]
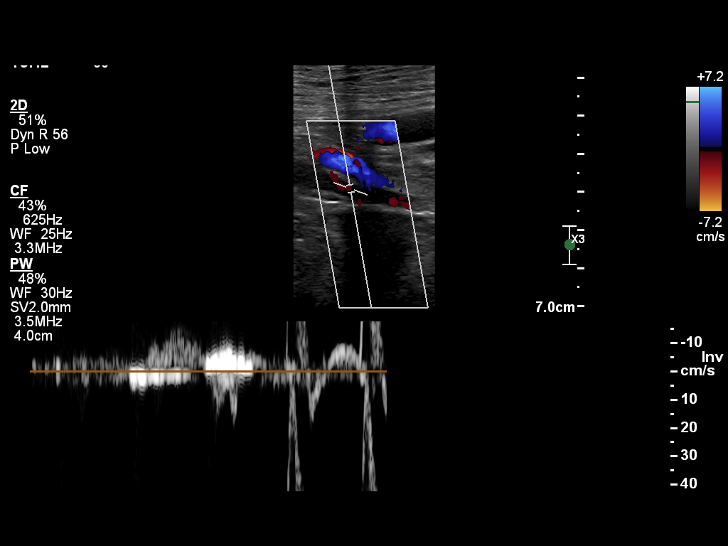
[im 9/34]
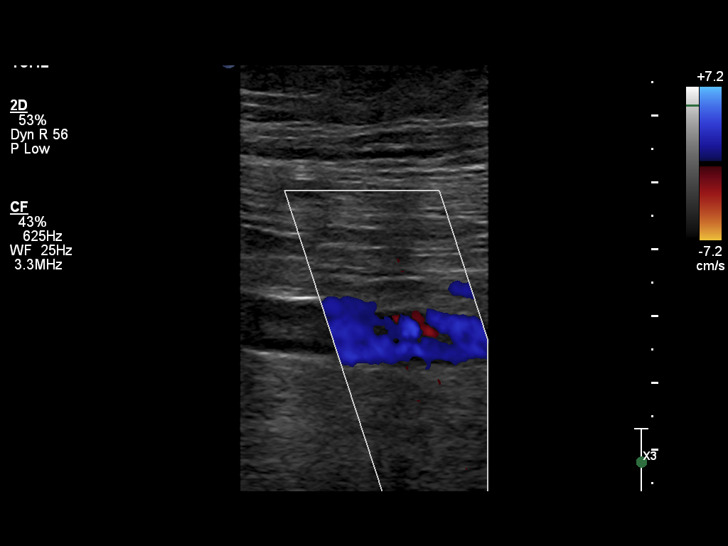
[im 12/34]
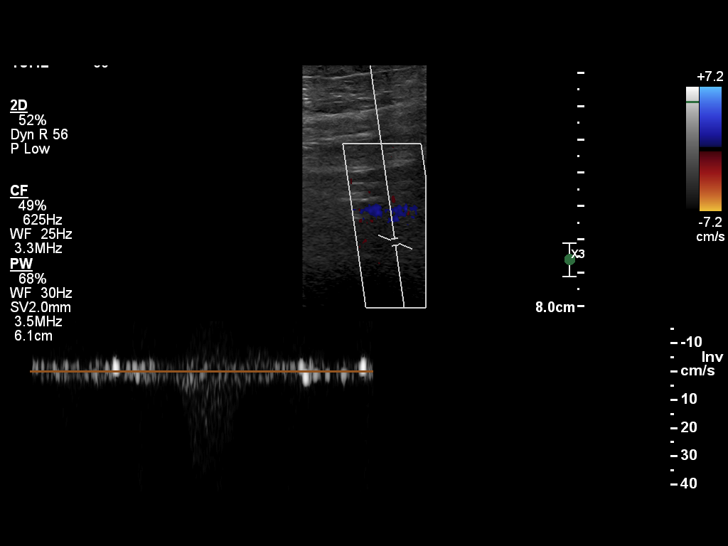
[im 13/34]
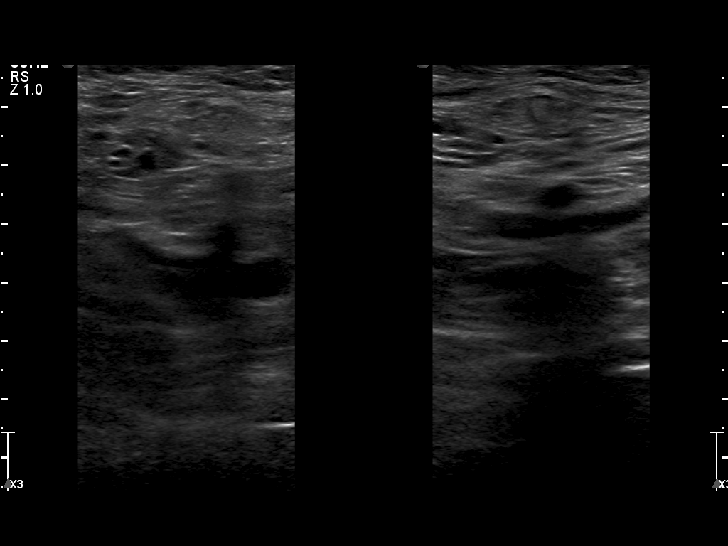
[im 16/34]
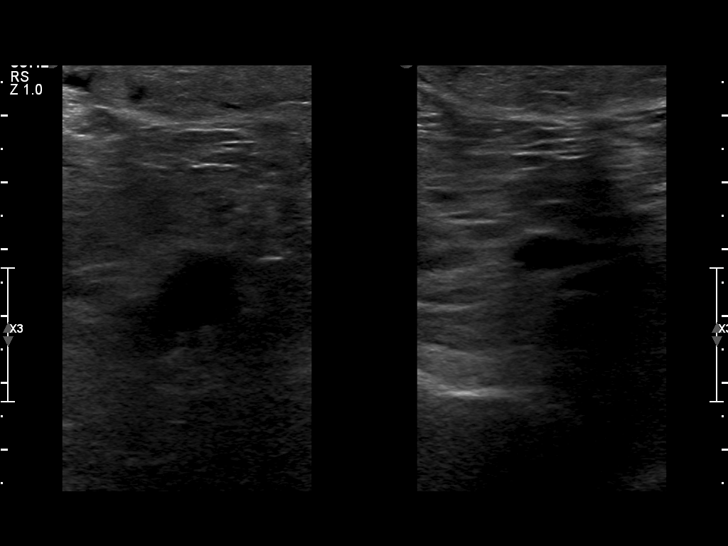
[im 18/34]
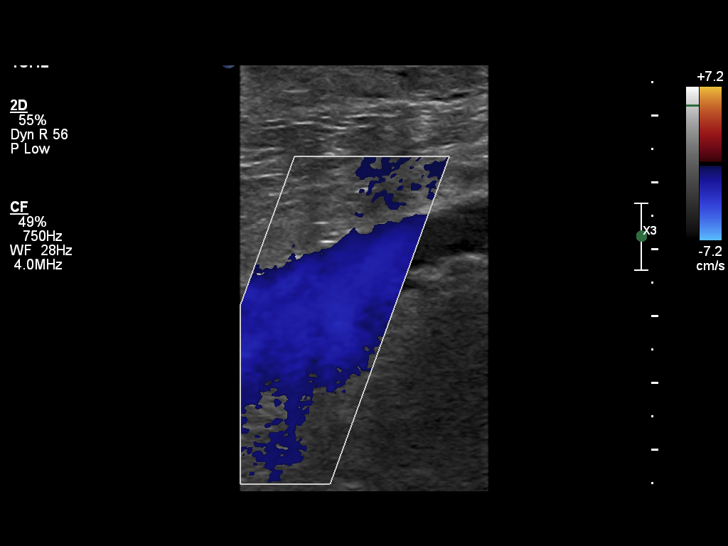
[im 21/34]
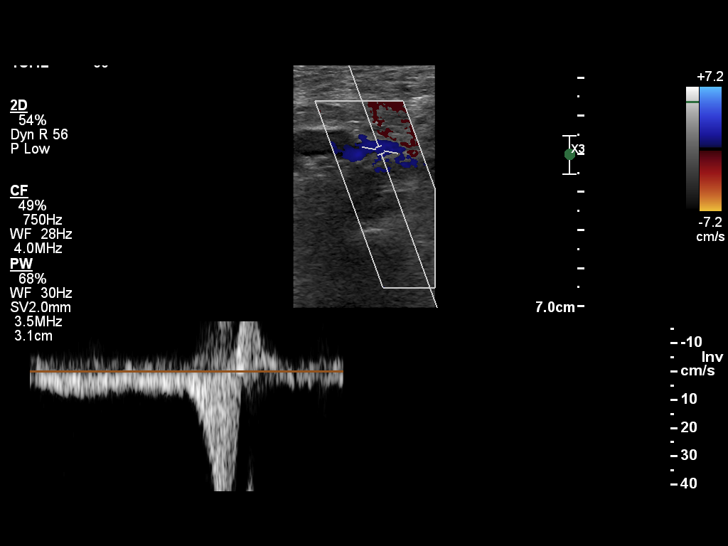
[im 23/34]
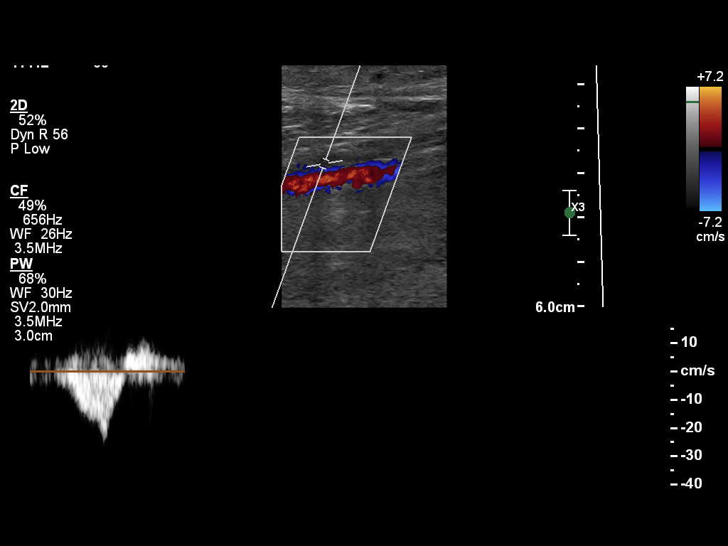
[im 25/34]
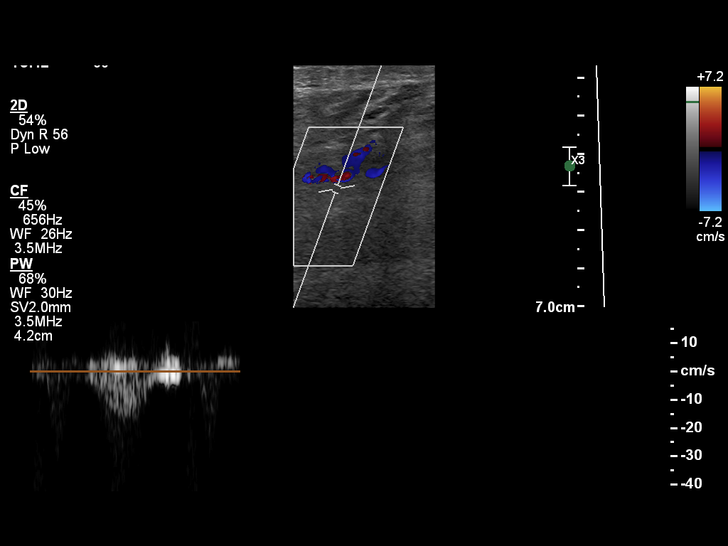
[im 28/34]
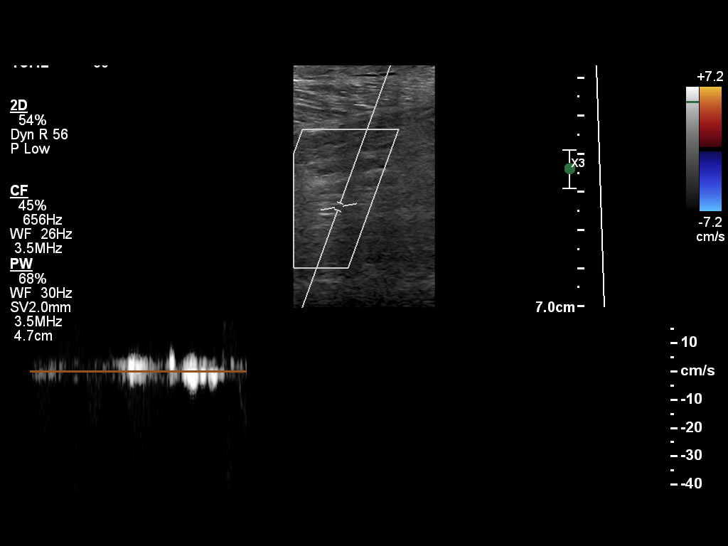
[im 31/34]
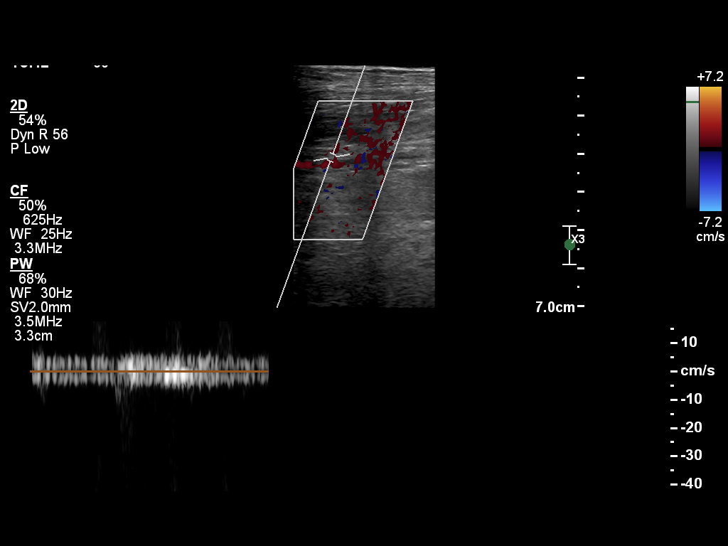
[im 34/34]
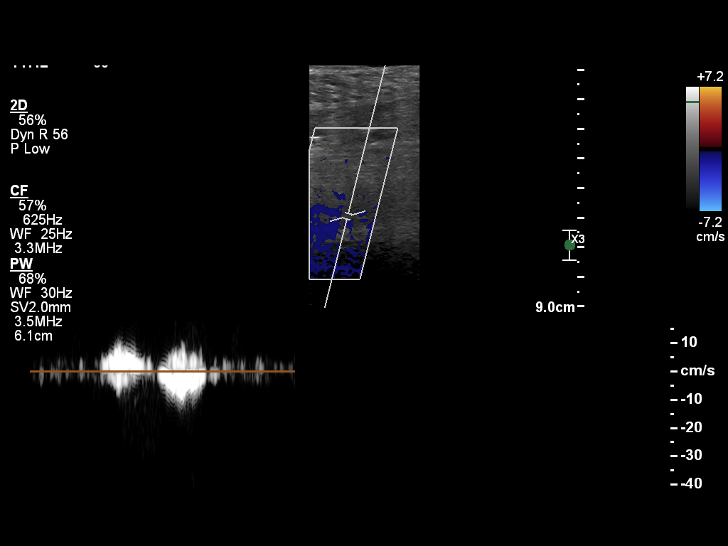

[14 of 25 positions shown; findings below may reference images not displayed]

EXAM

Left lower extremity venous duplex.

INDICATION

Previous left lower extremity DVT.

FINDINGS

Comparison made August 05, 2017.

The left common femoral and superficial femoral contain chronic appearing echogenic nonocclusive
thrombus.

The mid and distal superficial femoral vein, popliteal vein, and tibial veins augment and compress.

IMPRESSION

There is evidence of minimal residual thrombus in the left common femoral and superficial femoral
veins. This is improved since previous study.

Tech Notes:

jl

## 2018-10-06 ENCOUNTER — Encounter: Admit: 2018-10-06 | Discharge: 2018-10-06

## 2018-10-06 DIAGNOSIS — C61 Malignant neoplasm of prostate: Secondary | ICD-10-CM

## 2018-10-08 ENCOUNTER — Encounter: Admit: 2018-10-08 | Discharge: 2018-10-08

## 2018-10-08 DIAGNOSIS — E039 Hypothyroidism, unspecified: Secondary | ICD-10-CM

## 2018-10-08 DIAGNOSIS — E119 Type 2 diabetes mellitus without complications: Secondary | ICD-10-CM

## 2018-10-08 DIAGNOSIS — K219 Gastro-esophageal reflux disease without esophagitis: Secondary | ICD-10-CM

## 2018-10-08 DIAGNOSIS — B0229 Other postherpetic nervous system involvement: Secondary | ICD-10-CM

## 2018-10-08 DIAGNOSIS — Z87891 Personal history of nicotine dependence: Secondary | ICD-10-CM

## 2018-10-08 DIAGNOSIS — N189 Chronic kidney disease, unspecified: Secondary | ICD-10-CM

## 2018-10-08 DIAGNOSIS — J45909 Unspecified asthma, uncomplicated: Secondary | ICD-10-CM

## 2018-10-08 DIAGNOSIS — J449 Chronic obstructive pulmonary disease, unspecified: Secondary | ICD-10-CM

## 2018-10-08 DIAGNOSIS — C7951 Secondary malignant neoplasm of bone: Secondary | ICD-10-CM

## 2018-10-08 DIAGNOSIS — D649 Anemia, unspecified: Secondary | ICD-10-CM

## 2018-10-08 DIAGNOSIS — I214 Non-ST elevation (NSTEMI) myocardial infarction: Secondary | ICD-10-CM

## 2018-10-08 DIAGNOSIS — G4733 Obstructive sleep apnea (adult) (pediatric): Secondary | ICD-10-CM

## 2018-10-08 DIAGNOSIS — I509 Heart failure, unspecified: Secondary | ICD-10-CM

## 2018-10-08 DIAGNOSIS — C61 Malignant neoplasm of prostate: Secondary | ICD-10-CM

## 2018-10-08 DIAGNOSIS — E669 Obesity, unspecified: Secondary | ICD-10-CM

## 2018-10-08 DIAGNOSIS — I10 Essential (primary) hypertension: Secondary | ICD-10-CM

## 2018-10-08 DIAGNOSIS — A4902 Methicillin resistant Staphylococcus aureus infection, unspecified site: Secondary | ICD-10-CM

## 2018-10-08 DIAGNOSIS — M1990 Unspecified osteoarthritis, unspecified site: Secondary | ICD-10-CM

## 2018-10-08 DIAGNOSIS — E785 Hyperlipidemia, unspecified: Secondary | ICD-10-CM

## 2018-10-08 DIAGNOSIS — R0600 Dyspnea, unspecified: Secondary | ICD-10-CM

## 2018-10-08 DIAGNOSIS — I2510 Atherosclerotic heart disease of native coronary artery without angina pectoris: Secondary | ICD-10-CM

## 2018-10-08 NOTE — Progress Notes
[facility] Cancer Clinic   Genetic Counseling  Date: 10/08/2018  Alexander Holland  Alma# [id number]  DOB [birth year]    Dx Code: Kathrynn Humble    Obtained patient???s verbal consent to provide this telehealth visit due to the [facility] Emergency    Alexander Holland, age 68 y.o., was seen in telemedicine clinic today for genetic consultation regarding his diagnosis of aggressive prostate cancer.     Clinical History: Alexander Holland was diagnosed with prostate cancer at 68 years of age, it is gleason 9 and has metastasized, he has had 2 polyps on colonoscopy screening.     Family History provided:  - Maternal:  Mother passed away at 70 years of age with no known cancer history, her ovaries and uterus were intact.   Aunts / Uncles / Cousins: One uncle passed away in his 64's with no known cancer history. One half aunt and half uncle (mother's paternal half siblings) passed away at least in their 78's with no known cancer histories. No cousins with known cancer histories.   Grandparents: Grandmother passed away at 32 years of age with no known cancer history. Grandfather passed away in his 73's with no known cancer history.  Ancestry: English  no known Jewish ancestry  - Paternal:  Father passed away at 26 years of age with no known cancer history.  Aunts / Uncles / Cousins: One uncle passed away at 69 years of age with no known cancer history, his 73 year old son (Alexander Holland first cousin) was diagnosed with cancer type unknown in his 21's.   Grandparents: Grandmother passed away in her 46's from tuberculosis. Grandfather passed away at 47 years of age from alcohol usage.  Ancestry: Welsh    no known Jewish ancestry  - Siblings (7): One sister passed away at 65 years of age from kidney failure. Two brothers are 21 and 2 years of age with no known cancer histories. Four sisters are 34, 70, 54 and 17 years of age with no known cancer histories all have their ovaries and uterus intact. - Children (51):  One 67 year old son and two daughters who are 37 and 81 years of age with no known cancer histories.     Discussion:  We discussed hereditary cancer syndromes involving cancers in the family.  Red flags for hereditary syndromes include bilateral / multifocal disease, young age of onset (often <50 yrs), and 2-3 relatives with the same, or related conditions.    We discussed the NCCN guidelines version 1.2020 regarding genetic testing for hereditary prostate cancer. Based on the aggressive nature of his prostate cancer, Alexander Holland does meet the NCCN and Medicare guidelines to pursue genetic testing.         Testing for multiple genes for cancer susceptibility was recommended (a genetic panel).  IF a gene from the panel is found, this would affect medical management including surveillance for early detection and treatment decisions.    The discussion included an explanation of the different types of results which may be reported (positive / negative / or variant of unknown significance).  We discussed the various syndromes and diseases associated with the genes on the panel.     Our discussion included the patient's increased risk status, how genes affect cancer susceptibility, potential benefits, risk, and limitations of testing, and methods for early detection and prevention.    Plan:  Blood will be drawn on 6/29 and and sent to [facility] for ProstateNext Panel: 14 genes with susceptibility to cancer  including: ATM, BRCA1, BRCA2, CHEK2, EPCAM, HOXB13, MLH1, MSH2, MSH6, NBN, PALB2, RAD51D and TP53. Full gene sequencing and deletion duplication analysis is performed on 13 genes (excluding EPCAM).    Time:  60 minutes.  Obtained and reviewed family history.  Discussed hereditary cancer syndromes, autosomal dominant inheritance / other inheritance as relevant, presymptomatic surveillance, management and treatment.  Testing outcomes, including possibility of uncertain variants, strategies for testing other family members if a gene alteration is found. Insurance information and out of pocket price discussed. Completed paperwork including consent for genetic testing.      Salvatore Marvel, MS, [facility]  Certified Genetic Counselor  [facility]  [address] [place], [place]  [address]    Email: [email or web address]  Telephone [phone]  Fax:  [phone]  Appointments [phone]    Pending:  Follow-up for DNA test results and interpretation will be once results are obtained.  Additional testing on family members will be pursued as appropriate.    Family History   Problem Relation Age of Onset   ??? Kidney Failure Sister    ??? Tuberculosis Paternal Grandmother    ??? Alcohol abuse Paternal Grandfather    ??? Cancer Other 43        type unknown

## 2018-10-11 NOTE — Progress Notes
Name: Alexander Holland          MRN: [id number]      DOB: 1950-12-18      AGE: 68 y.o.   DATE OF SERVICE: 10/12/2018    Subjective:           Reason for Visit: Metastatic prostate cancer  Heme/Onc Care    Cancer Staging  Prostate cancer [facility])  Staging form: Prostate, AJCC 7th Edition  - Clinical stage from 01/31/2016: Stage IV (T3b, N1, M1b, PSA: Less than 10, Gleason 8-10) - Signed by Ross Marcus, MD on 01/31/2016       Prostate cancer (HCC)    01/05/2016 Surgery     Transurethral resection of prostate: Pathology revealed Gleason 4+5=9 adenocarcinoma of the prostate      01/17/2016 Initial Diagnosis     Prostate cancer (HCC)      01/17/2016 Imaging     MRI pelvis: Extension of prostate tumor into mesorectal fascia and seminal vesicles, pelvic lymphadenopathy, +abnormal osseous lesions concerning for metastatic disease        01/26/2016 Imaging     Bone scan: Widespread osseous metastases in axial and appendicular skeleton      01/31/2016 - 03/04/2016 Chemotherapy     Bicalutamide 50 mg PO daily      02/19/2016 - 06/07/2016 Chemotherapy     Docetaxel 75 mg/m2 IV q3 weeks, plan for 6 cycles.  Administered without curative intent.      02/19/2016 -  Chemotherapy     Lupron 22.5 mg IM q3 months       06/09/2017 - 06/08/2018 Chemotherapy     Abiraterone 1000 mg PO daily + Prednisone 5 mg PO BID, given without curative intent (also participating in CHAARTED 2 trial)      06/09/2017 - 10/13/2017 Chemotherapy     Cabazitaxel 25 mg PO IV q21d, given without curative intent as investigational arm of CHAARTED 2 trial      05/21/2018 - 05/27/2018 Radiation     Palliative radiation to L medial iliac bone. Total dose 35 Gy in 5 fractions      07/22/2018 -  Chemotherapy     OP SUPPORT PROSTATE ENZALUTAMIDE  Plan Provider: Risa Grill, MD  Treatment goal: Palliative        History of Present Illness  Alexander Holland returns for clinical/lab follow up while on treatment with Enzalutamide for metastatic, castrate-resistant prostate cancer.     Today he reports that he has been feeling generally well since his last visit. His knees have been more achy and his neck is a little stiff, but he denies pain in these locations. His left leg and hip pain are better and he typically only needs Hydro/APAP at night.     Fatigue is more prominent lately, but this doesn't limit him or prevent him from doing any of his normal activities.     Denies any other new problems or concerning symptoms. Specifically, he denies any dyspnea, cough, nausea, vomiting, abdominal pain, diarrhea, constipation, or rash.            Review of Systems   HENT: Negative.    Eyes: Negative.    Respiratory: Positive for apnea (Chronic obstructive sleep apnea).    Cardiovascular: Negative.    Gastrointestinal: Negative.    Endocrine: Negative.    Musculoskeletal: Negative.    Skin: Negative.    Allergic/Immunologic: Negative.    Neurological: Negative.    Hematological: Negative.    Psychiatric/Behavioral: Negative.  Objective:      Past medical, surgical, family, and social history have been reviewed with the patient on 10/12/2018 and confirmed accuracy of the information outlined below:  Medical History:   Diagnosis Date   ??? Anemia    ??? Arthritis     DJD-Hip   ??? Asthma    ??? CAD (coronary artery disease)    ??? CHF (congestive heart failure) (HCC) 08/2015   ??? Chronic renal insufficiency    ??? COPD (chronic obstructive pulmonary disease) (HCC)    ??? DM (diabetes mellitus) (HCC)     120's,130's   ??? DOE (dyspnea on exertion)    ??? Ex-smoker    ??? GERD (gastroesophageal reflux disease)    ??? HLD (hyperlipidemia)    ??? HTN (hypertension)    ??? Hypothyroidism    ??? MRSA (methicillin resistant Staphylococcus aureus)    ??? NSTEMI (non-ST elevated myocardial infarction) (HCC)     1997, 2012   ??? Obesity    ??? OSA on CPAP    ??? Postherpetic neuralgia      Surgical History:   Procedure Laterality Date   ??? CORONARY STENT PLACEMENT  1997 ??? CARDIAC SURGERY  2012    CABG   ??? CYSTOSCOPY WITH TRANSURETHRAL PROSTATECTOMY N/A 01/05/2016    Performed by Mittie Bodo, MD at [facility] OR   ??? TUNNELED VENOUS PORT PLACEMENT Right 06/06/2017    right EJ port placement per IR   ??? ABDOMEN SURGERY      Umbilical Hernia   ??? HX HEART CATHETERIZATION     ??? LEFT HEART CATHETERIZATION      s/p PCI ~1995     Family History   Problem Relation Age of Onset   ??? Kidney Failure Sister    ??? Tuberculosis Paternal Grandmother    ??? Alcohol abuse Paternal Grandfather    ??? Cancer Other 35        type unknown     Social History     Socioeconomic History   ??? Marital status: Married     Spouse name: Not on file   ??? Number of children: Not on file   ??? Years of education: Not on file   ??? Highest education level: Not on file   Occupational History   ??? Not on file   Tobacco Use   ??? Smoking status: Former Smoker     Packs/day: 1.00     Years: 30.00     Pack years: 30.00     Types: Cigarettes     Last attempt to quit: 02/01/1996     Years since quitting: 22.7   ??? Smokeless tobacco: Never Used   Substance and Sexual Activity   ??? Alcohol use: Yes     Comment: rarely   ??? Drug use: No   ??? Sexual activity: Not on file   Other Topics Concern   ??? Not on file   Social History Narrative   ??? Not on file   Alexander Holland is married and lives with his wife. 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 They have 3 children (2 daughters, 1 son).        ??? ACCU-CHEK SOFTCLIX LANCETS MISC Use  as directed twice daily.     ??? albuterol (VENTOLIN HFA, PROAIR HFA) 90 mcg/actuation inhaler Inhale 2 Puffs by mouth every 6 hours as needed.     ??? albuterol 0.083% (PROVENTIL; VENTOLIN) 2.5 mg /3 mL (0.083 %) nebulizer  solution Inhale 3 mL solution by nebulizer as directed every 4 hours as needed for Wheezing or Shortness of Breath.   ??? aspirin EC 81 mg tablet Take 81 mg by mouth at bedtime daily. Take with food.   ??? atorvastatin (LIPITOR) 80 mg tablet Take 80 mg by mouth at bedtime daily.   ??? blood sugar diagnostic (ACCU-CHEK AVIVA PLUS) test strip 1 Strip by Test route before meals and at bedtime.     ??? carvedilol (COREG) 3.125 mg tablet Take one tablet by mouth twice daily with meals. Take with food.   ??? cholecalciferol (VITAMIN D-3) 1,000 units tablet Take 1,000 Units by mouth twice daily.   ??? edoxaban (SAVAYSA) 60 mg tab tablet Take one tablet by mouth daily.   ??? empagliflozin (JARDIANCE) 10 mg tablet Take one tablet by mouth daily.   ??? enzalutamide (XTANDI) 40 mg capsule Take four capsules by mouth daily. Take at the same time every day.   ??? EPINEPHrine (EPIPEN) 1 mg/mL injection pen (2-Pack) Inject 0.3 mg (1 Pen) into thigh if needed for anaphylactic reaction. May repeat in 5-15 minutes if needed.  Indications: a significant type of allergic reaction called anaphylaxis   ??? ferrous sulfate 325 mg (65 mg iron) tablet Take one tablet by mouth twice daily with meals.   ??? furosemide (LASIX) 40 mg tablet Take one tablet by mouth twice daily. Morning and afternoon   ??? gabapentin (NEURONTIN) 600 mg tablet Take one tablet by mouth every 8 hours.   ??? insulin aspart U-100 (NOVOLOG FLEXPEN) 100 unit/mL (3 mL) injection PEN Inject twelve Units under the skin three times daily with meals.   ??? insulin glargine (LANTUS SOLOSTAR) 100 unit/mL (3 mL) injection PEN Inject sixty Units under the skin at bedtime daily.   ??? levothyroxine (SYNTHROID) 25 mcg tablet Take 25 mcg by mouth daily. Before breakfast    ??? lisinopril (PRINIVIL, ZESTRIL) 2.5 mg tablet Take 1 tablet by mouth daily.   ??? metFORMIN (GLUCOPHAGE) 1,000 mg tablet Take 1,000 mg by mouth twice daily with meals.     ??? nitroglycerin (NITROSTAT) 0.4 mg tablet Place 0.4 mg under tongue every 5 minutes as needed.     ??? omeprazole DR(+) (PRILOSEC) 20 mg capsule Take 20 mg by mouth daily.     ??? ondansetron (ZOFRAN) 8 mg tablet Take one tablet by mouth every 8 hours as needed for Nausea or Vomiting. Indications: nausea and vomiting caused by cancer drugs ??? oxyCODONE (ROXICODONE) 10 mg tablet Take 1-1.5 tablets by mouth every 3 hours as needed for Pain  Take   ??? senna (SENOKOT) 8.6 mg tablet Take 2 tablets by mouth at bedtime as needed for Constipation.   ??? sertraline (ZOLOFT) 50 mg tablet Take 1 tablet by mouth daily.   ??? triamterene-hydrochlorothiazide (DYAZIDE) 37.5-25 mg capsule Take 1 capsule by mouth every morning.     Vitals:    10/12/18 0852 10/12/18 0854   BP: 121/47    BP Source: Arm, Left Upper    Patient Position: Sitting    Pulse: 73    Resp: 16    Temp: 36.7 ???C (98 ???F)    TempSrc: Oral    SpO2: 92%    Weight: 99.2 kg (218 lb 12.8 oz)    Height: 170.2 cm (67.01)    PainSc:  Six     Body mass index is 34.26 kg/m???.   Pain Score: Six  Pain Loc: KneePain Addressed:  Patient to call office if pain not relieved  or worsened and Current regimen working to control pain.    Patient Evaluated for a Clinical Trial: No treatment clinical trial available for this patient.      [place] Cooperative Oncology Group performance status is 1, Restricted in physically strenuous activity but ambulatory and able to carry out work of a light or sedentary nature, e.g., light house work, office work.     Physical Exam  Constitutional: Well-developed, well-nourished gentleman sitting comfortably in exam room.   Eyes: EOMI, no conjunctival injection, anicteric sclerae  ENT: Nares patent, lips and oral mucosae moist, no exudates or ulcerations. Good dentition. No stridor.  CV: Normal rate, regular rhythm, no murmur, rub, or gallop.   Respiratory: Normal work of breathing on room air. Good air movement throughout chest, no wheeze, rales, or rhonchi.  GI: Abdomen obese, soft, and nondistended. Bowel sounds present in all 4 quadrants. No tenderness to palpation. No hepatomegaly, spleen tip not palpated.  GU: No catheter present  Msk: Normal muscle bulk and tone. 1+ nonpitting edema LLE.   Neuro: Cranial nerves grossly intact and symmetric. No focal neurologic deficits. Antalgic gait. Integument/skin: No rash visualized nor any skin eruptions palpated.  Heme/lymph: No pathologic-appearing bruising. No cervical or supraclavicular lymphdenopathy.  Psych: Alert and oriented to person, place, date, and situation. Affect is full and content. Good insight and judgement.              Data:  I have reviewed the patient's CBC/differential and compared to prior values. The full CBC is as follows:  CBC with Diff Latest Ref Rng & Units 10/12/2018 08/31/2018 07/22/2018 06/08/2018 05/23/2018   WBC 4.5 - 11.0 K/UL 4.5 5.0 5.1 3.0(L) 4.4(L)   RBC 4.4 - 5.5 M/UL 3.16(L) 3.36(L) 3.12(L) 2.96(L) 3.25(L)   HGB 13.5 - 16.5 GM/DL 8.2(L) 9.3(L) 8.9(L) 8.2(L) 8.8(L)   HCT 40 - 50 % 27.4(L) 29.9(L) 28.2(L) 26.1(L) 28.3(L)   MCV 80 - 100 FL 86.6 89.1 90.4 88.2 87.0   MCH 26 - 34 PG 26.0 27.6 28.4 27.6 26.9   MCHC 32.0 - 36.0 G/DL 30.1(L) 31.0(L) 31.5(L) 31.3(L) 30.9(L)   RDW 11 - 15 % 19.1(H) 17.1(H) 18.0(H) 20.2(H) 20.1(H)   PLT 150 - 400 K/UL 166 186 193 163 113(L)   MPV 7 - 11 FL 7.8 7.8 7.8 7.8 8.1   NEUT 41 - 77 % 55 66 70 73 80(H)   ANC 1.8 - 7.0 K/UL 2.60 3.30 3.50 2.20 3.50   LYMA 24 - 44 % 15(L) 16(L) 11(L) 14(L) 9(L)   ALYM 1.0 - 4.8 K/UL 0.70(L) 0.80(L) 0.60(L) 0.40(L) 0.40(L)   MONA 4 - 12 % 10 8 10 9 8    AMONO 0 - 0.80 K/UL 0.40 0.40 0.50 0.30 0.40   EOSA 0 - 5 % 18(H) 9(H) 8(H) 3 3   AEOS 0 - 0.45 K/UL 0.80(H) 0.50(H) 0.40 0.10 0.10   BASA 0 - 2 % 2 1 1 1  0   ABAS 0 - 0.20 K/UL 0.10 0.10 0.10 0.00 0.00       I have reviewed the patient's CMP and compared to prior values. The full CMP is as follows:  CMP Latest Ref Rng & Units 10/12/2018 08/31/2018 07/22/2018 06/08/2018 05/23/2018   NA 137 - 147 MMOL/L 139 139 136(L) 140 142   K 3.5 - 5.1 MMOL/L 4.0 3.7 4.0 3.9 3.8   CL 98 - 110 MMOL/L 101 98 99 100 100   CO2 21 - 30 MMOL/L 29 32(H) 25 31(H) 35(H)  GAP 3 - 12 9 9 12 9 7    BUN 7 - 25 MG/DL 20 17 15 13  32(H)   CR 0.4 - 1.24 MG/DL 1.61 0.96 0.45 4.09 8.11   GLUX 70 - 100 MG/DL 914(N) 82(N) 562(Z) 308(M) 112(H) CA 8.5 - 10.6 MG/DL 8.8 9.4 9.2 8.7 9.1   TP 6.0 - 8.0 G/DL 7.0 7.3 7.0 6.5 -   ALB 3.5 - 5.0 G/DL 3.7 3.6 5.7(Q) 3.2(L) -   ALKP 25 - 110 U/L 166(H) 193(H) 268(H) 379(H) -   ALT 7 - 56 U/L 8 12 14 15  -   TBILI 0.3 - 1.2 MG/DL 0.5 0.6 0.7 0.6 -   GFR >60 mL/min >60 >60 >60 >60 >60   GFRAA >60 mL/min >60 >60 >60 >60 >60     Lab Results   Component Value Date    PSA 1.39 10/12/2018    PSA 0.52 08/31/2018    PSA 0.24 07/22/2018    PSA 0.54 06/08/2018    PSA 1.95 04/20/2018    TESTOSTER <10 (L) 10/12/2018    TESTOSTER 12 (L) 08/31/2018    TESTOSTER <10 (L) 07/22/2018    TESTOSTER 22 (L) 06/08/2018    TESTOSTER 44 (L) 03/09/2018                  Assessment and Plan: Alexander Holland is a 68 y.o. gentleman with history of coronary artery disease who presents regarding evaluation and management of metastatic, castrate-resistant prostate cancer.    1. 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   ???In light of this, I recommend that we schedule RTC with restaging imaging in approximately one month. Counseled him that we will not plan to make changes in his systemic therapy without radiographic or clinical progression.  ???Continue Enzalutamide 160 mg PO daily, all labs reviewed and appropriate to continue with treatment.  ???Will also pursue next generation sequencing of his tumor to evaluate for the presence of mutations that would confer sensitivity to targeted therapies or immunotherapy.    ???RTC 1 month with labs and imaging     2. Pain: LLE pain  ???With both mixed nociceptive and neuropathic/radicular components (neuropathic seems more prominent now).  ???Cont Gabapentin to 600 mg PO TID  ???Cont Hydro/APAP 10/325 mg per his preference     3. Anemia:  ???Grade 2, due to balanced vitamin B12 deficiency (may be dietary) and iron deficiency (likely due to GI blood loss given that he is on anticoagulation). Suspect this is not solely or directly due to his cancer.  ???Continue Ferrous sulfate 325 mg PO q12h, follow up colonoscopy results from GI  ???Continue B12 1000 mg IM monthly thereafter, to be given indefinitely.     4. Neuropathy:  ???Grade 1, due to Cabazitaxel, prior Docetaxel, and diabetes.  ???No treatment indicated except for Gabapentin being used for radicular LLE pain.    5. Fatigue:  ???Due to cancer, continue energy conservation    6. Arthralgias:  ???Due to Enzalutamide, continue Hydro/APAP PRN    Goals of care:  06/08/18: Stated goal is longevity.  He stated that he is willing to accept the risk of medical burden (new symptoms, hospitalizations) in pursuit of this goal.           Total face-to-face time for visit: 25 minutes, all (> 50%) of which was spent in education  and counseling with patient and wife in clinic. Discussed the patient's cancer diagnosis, management, pain, arthralgias, fatigue, and reviewed goals.

## 2018-10-12 ENCOUNTER — Encounter: Admit: 2018-10-12 | Discharge: 2018-10-12

## 2018-10-12 DIAGNOSIS — G893 Neoplasm related pain (acute) (chronic): Secondary | ICD-10-CM

## 2018-10-12 DIAGNOSIS — G629 Polyneuropathy, unspecified: Secondary | ICD-10-CM

## 2018-10-12 DIAGNOSIS — E669 Obesity, unspecified: Secondary | ICD-10-CM

## 2018-10-12 DIAGNOSIS — I10 Essential (primary) hypertension: Secondary | ICD-10-CM

## 2018-10-12 DIAGNOSIS — R530 Neoplastic (malignant) related fatigue: Secondary | ICD-10-CM

## 2018-10-12 DIAGNOSIS — G4733 Obstructive sleep apnea (adult) (pediatric): Secondary | ICD-10-CM

## 2018-10-12 DIAGNOSIS — D649 Anemia, unspecified: Secondary | ICD-10-CM

## 2018-10-12 DIAGNOSIS — J45909 Unspecified asthma, uncomplicated: Secondary | ICD-10-CM

## 2018-10-12 DIAGNOSIS — Z87891 Personal history of nicotine dependence: Secondary | ICD-10-CM

## 2018-10-12 DIAGNOSIS — I214 Non-ST elevation (NSTEMI) myocardial infarction: Secondary | ICD-10-CM

## 2018-10-12 DIAGNOSIS — K219 Gastro-esophageal reflux disease without esophagitis: Secondary | ICD-10-CM

## 2018-10-12 DIAGNOSIS — C61 Malignant neoplasm of prostate: Secondary | ICD-10-CM

## 2018-10-12 DIAGNOSIS — E1122 Type 2 diabetes mellitus with diabetic chronic kidney disease: Secondary | ICD-10-CM

## 2018-10-12 DIAGNOSIS — T451X5A Adverse effect of antineoplastic and immunosuppressive drugs, initial encounter: Secondary | ICD-10-CM

## 2018-10-12 DIAGNOSIS — I2510 Atherosclerotic heart disease of native coronary artery without angina pectoris: Secondary | ICD-10-CM

## 2018-10-12 DIAGNOSIS — E538 Deficiency of other specified B group vitamins: Secondary | ICD-10-CM

## 2018-10-12 DIAGNOSIS — M2550 Pain in unspecified joint: Secondary | ICD-10-CM

## 2018-10-12 DIAGNOSIS — E119 Type 2 diabetes mellitus without complications: Secondary | ICD-10-CM

## 2018-10-12 DIAGNOSIS — R0600 Dyspnea, unspecified: Secondary | ICD-10-CM

## 2018-10-12 DIAGNOSIS — C775 Secondary and unspecified malignant neoplasm of intrapelvic lymph nodes: Secondary | ICD-10-CM

## 2018-10-12 DIAGNOSIS — E1140 Type 2 diabetes mellitus with diabetic neuropathy, unspecified: Secondary | ICD-10-CM

## 2018-10-12 DIAGNOSIS — E039 Hypothyroidism, unspecified: Secondary | ICD-10-CM

## 2018-10-12 DIAGNOSIS — E785 Hyperlipidemia, unspecified: Secondary | ICD-10-CM

## 2018-10-12 DIAGNOSIS — C7951 Secondary malignant neoplasm of bone: Secondary | ICD-10-CM

## 2018-10-12 DIAGNOSIS — I509 Heart failure, unspecified: Secondary | ICD-10-CM

## 2018-10-12 DIAGNOSIS — J449 Chronic obstructive pulmonary disease, unspecified: Secondary | ICD-10-CM

## 2018-10-12 DIAGNOSIS — M1990 Unspecified osteoarthritis, unspecified site: Secondary | ICD-10-CM

## 2018-10-12 DIAGNOSIS — A4902 Methicillin resistant Staphylococcus aureus infection, unspecified site: Secondary | ICD-10-CM

## 2018-10-12 DIAGNOSIS — N189 Chronic kidney disease, unspecified: Secondary | ICD-10-CM

## 2018-10-12 DIAGNOSIS — B0229 Other postherpetic nervous system involvement: Secondary | ICD-10-CM

## 2018-10-12 LAB — COMPREHENSIVE METABOLIC PANEL
Lab: 0.5 mg/dL (ref 0.3–1.2)
Lab: 1 mg/dL — ABNORMAL HIGH (ref 0.4–1.24)
Lab: 139 MMOL/L — ABNORMAL LOW (ref >60)
Lab: 142 mg/dL — ABNORMAL HIGH (ref 70–100)
Lab: 166 U/L — ABNORMAL HIGH (ref 25–110)
Lab: 20 U/L — ABNORMAL HIGH (ref 7–40)
Lab: 29 MMOL/L (ref 21–30)
Lab: 3.7 g/dL — ABNORMAL LOW (ref 3.5–5.0)
Lab: 4 MMOL/L — ABNORMAL LOW (ref >60)
Lab: 7 g/dL (ref 6.0–8.0)
Lab: 8 U/L (ref 7–56)
Lab: 8.8 mg/dL (ref 8.5–10.6)
Lab: 9 10*3/uL — ABNORMAL LOW (ref 3–12)
Lab: >60 mL/min (ref >60)
Lab: >60 mL/min — ABNORMAL HIGH (ref >60)

## 2018-10-12 LAB — CBC AND DIFF
Lab: 0.1 10*3/uL (ref 0–0.20)
Lab: 3.1 M/UL — ABNORMAL LOW (ref 4.4–5.5)
Lab: 4.5 10*3/uL — ABNORMAL LOW (ref 4.5–11.0)

## 2018-10-12 LAB — MISC REFERENCE TEST

## 2018-10-12 LAB — TESTOSTERONE,TOTAL: Lab: <10 ng/dL — ABNORMAL LOW (ref 270–1070)

## 2018-10-12 LAB — PROSTATIC SPECIFIC ANTIGEN-PSA: Lab: 1.3 ng/mL (ref <4.01)

## 2018-10-12 MED ORDER — HYDROCODONE-ACETAMINOPHEN 10-325 MG PO TAB
1 | ORAL_TABLET | ORAL | 0 refills | 30.00000 days | Status: DC | PRN
Start: 2018-10-12 — End: 2018-12-11

## 2018-10-12 MED ORDER — MORPHINE 15 MG PO TBER
15 mg | ORAL_TABLET | Freq: Every evening | ORAL | 0 refills | 7.00000 days | Status: DC
Start: 2018-10-12 — End: 2018-12-11

## 2018-10-12 MED ORDER — JARDIANCE 10 MG PO TAB
10 mg | ORAL_TABLET | Freq: Every day | ORAL | 3 refills | Status: AC
Start: 2018-10-12 — End: ?

## 2018-10-12 NOTE — Telephone Encounter
Received refill request from [place] for 90-day supply for patients Jardiance. Per [facility], this is what insurance prefers. Patient recently had telehealth appointment completed on 08/27/18 . Okay to refill per protocol.

## 2018-10-14 ENCOUNTER — Encounter: Admit: 2018-10-14 | Discharge: 2018-10-14

## 2018-10-14 MED FILL — ENZALUTAMIDE 40 MG PO CAP: 40 mg | ORAL | 30 days supply | Qty: 120 | Fill #4 | Status: AC

## 2018-10-19 ENCOUNTER — Encounter: Admit: 2018-10-19 | Discharge: 2018-10-19

## 2018-10-19 MED FILL — EDOXABAN 60 MG PO TAB: 60 mg | ORAL | 30 days supply | Qty: 30 | Fill #3 | Status: AC

## 2018-10-27 ENCOUNTER — Encounter: Admit: 2018-10-27 | Discharge: 2018-10-27

## 2018-10-27 NOTE — Telephone Encounter
Pt spouse Marta Lamas called concerned as pt is scheduled for scans on 11/06/2018 and pt had allergic reaction to contrast with last scans. They are wanting to verify if will be having scans with contrast.    Attempted to contact pt and spouse, no answer, busy signal.

## 2018-10-28 NOTE — Telephone Encounter
Returned call and spoke to [place]. Informed her contrast for CT and bone scan are different than MRI, which is what pt had anaphylactic reaction to. Assured [place] that we do not expect pt to have a reaction to the contrast he would be given on 11/06/2018. Encouraged pt to contact us with any other questions or concerns.    Called Radiology and spoke to [place] who agreed pt should be fine, especially given that pt has had bone scans and CT with contrast several times before.

## 2018-11-04 ENCOUNTER — Encounter: Admit: 2018-11-04 | Discharge: 2018-11-04

## 2018-11-04 NOTE — Telephone Encounter
Pt called to express his concern about having contrast with CT due to episode of anaphylaxis with MRI contrast. Assured pt we have spoken with radiologist and confirmed contrast is different and they do not feel the need for premedication. Also informed pt we would honor is wish if he choses to do without contrast. Dr Wlff-[place] aware and pt will let us know if he wants no contrast.

## 2018-11-06 ENCOUNTER — Encounter: Admit: 2018-11-06 | Discharge: 2018-11-06

## 2018-11-06 ENCOUNTER — Encounter: Admit: 2018-11-06 | Discharge: 2018-11-19

## 2018-11-06 DIAGNOSIS — C7951 Secondary malignant neoplasm of bone: Secondary | ICD-10-CM

## 2018-11-06 DIAGNOSIS — C61 Malignant neoplasm of prostate: Principal | ICD-10-CM

## 2018-11-06 LAB — LIPID PROFILE
Lab: 135 mg/dL — ABNORMAL LOW (ref <200)
Lab: 158 mg/dL — ABNORMAL HIGH (ref <150)
Lab: 32 mg/dL — ABNORMAL LOW (ref 4–12)
Lab: 42 mg/dL (ref >40)
Lab: 69 mg/dL — ABNORMAL LOW (ref <100)
Lab: 93 mg/dL (ref >60)

## 2018-11-06 LAB — PROSTATIC SPECIFIC ANTIGEN-PSA: Lab: 2.1 ng/mL (ref <4.01)

## 2018-11-06 LAB — HEMOGLOBIN A1C: Lab: 5.8 % (ref 4.0–6.0)

## 2018-11-06 LAB — POC CREATININE, RAD: Lab: 1.3 mg/dL — ABNORMAL HIGH (ref 0.4–1.24)

## 2018-11-06 MED ORDER — SODIUM CHLORIDE 0.9 % IJ SOLN
50 mL | Freq: Once | INTRAVENOUS | 0 refills | Status: CP
Start: 2018-11-06 — End: ?
  Administered 2018-11-06: 14:00:00 50 mL via INTRAVENOUS

## 2018-11-06 MED ORDER — RP DX TC-99M MEDRONATE MCI
25 | Freq: Once | INTRAVENOUS | 0 refills | Status: CP
Start: 2018-11-06 — End: ?
  Administered 2018-11-06: 13:00:00 26.5 via INTRAVENOUS

## 2018-11-06 MED ORDER — IOHEXOL 350 MG IODINE/ML IV SOLN
100 mL | Freq: Once | INTRAVENOUS | 0 refills | Status: CP
Start: 2018-11-06 — End: ?
  Administered 2018-11-06: 14:00:00 100 mL via INTRAVENOUS

## 2018-11-09 ENCOUNTER — Encounter: Admit: 2018-11-09 | Discharge: 2018-11-09

## 2018-11-09 DIAGNOSIS — C61 Malignant neoplasm of prostate: Secondary | ICD-10-CM

## 2018-11-09 MED ORDER — ENZALUTAMIDE 40 MG PO CAP
160 mg | ORAL_CAPSULE | Freq: Every day | ORAL | 3 refills
Start: 2018-11-09 — End: ?

## 2018-11-10 ENCOUNTER — Encounter: Admit: 2018-11-10 | Discharge: 2018-11-10

## 2018-11-11 ENCOUNTER — Encounter: Admit: 2018-11-11 | Discharge: 2018-11-11

## 2018-11-12 ENCOUNTER — Encounter: Admit: 2018-11-12 | Discharge: 2018-11-12

## 2018-11-12 DIAGNOSIS — G893 Neoplasm related pain (acute) (chronic): Secondary | ICD-10-CM

## 2018-11-12 DIAGNOSIS — C778 Secondary and unspecified malignant neoplasm of lymph nodes of multiple regions: Secondary | ICD-10-CM

## 2018-11-12 DIAGNOSIS — R160 Hepatomegaly, not elsewhere classified: Secondary | ICD-10-CM

## 2018-11-12 DIAGNOSIS — C61 Malignant neoplasm of prostate: Secondary | ICD-10-CM

## 2018-11-12 DIAGNOSIS — D509 Iron deficiency anemia, unspecified: Secondary | ICD-10-CM

## 2018-11-12 DIAGNOSIS — C7951 Secondary malignant neoplasm of bone: Secondary | ICD-10-CM

## 2018-11-12 DIAGNOSIS — R530 Neoplastic (malignant) related fatigue: Secondary | ICD-10-CM

## 2018-11-12 DIAGNOSIS — Z87891 Personal history of nicotine dependence: Secondary | ICD-10-CM

## 2018-11-12 DIAGNOSIS — M1990 Unspecified osteoarthritis, unspecified site: Secondary | ICD-10-CM

## 2018-11-12 NOTE — Progress Notes
Name: Alexander Holland          MRN: [id number]      DOB: 04-Sep-1950      AGE: 68 y.o.   DATE OF SERVICE: 11/12/2018    Subjective:           Reason for Visit: Metastatic prostate cancer  Follow Up    Cancer Staging  Prostate cancer [facility])  Staging form: Prostate, AJCC 7th Edition  - Clinical stage from 01/31/2016: Stage IV (T3b, N1, M1b, PSA: Less than 10, Gleason 8-10) - Signed by Ross Marcus, MD on 01/31/2016       Prostate cancer (HCC)    01/05/2016 Surgery     Transurethral resection of prostate: Pathology revealed Gleason 4+5=9 adenocarcinoma of the prostate      01/17/2016 Initial Diagnosis     Prostate cancer (HCC)      01/17/2016 Imaging     MRI pelvis: Extension of prostate tumor into mesorectal fascia and seminal vesicles, pelvic lymphadenopathy, +abnormal osseous lesions concerning for metastatic disease        01/26/2016 Imaging     Bone scan: Widespread osseous metastases in axial and appendicular skeleton      01/31/2016 - 03/04/2016 Chemotherapy     Bicalutamide 50 mg PO daily      02/19/2016 - 06/07/2016 Chemotherapy     Docetaxel 75 mg/m2 IV q3 weeks, plan for 6 cycles.  Administered without curative intent.      02/19/2016 -  Chemotherapy     Lupron 22.5 mg IM q3 months       06/09/2017 - 06/08/2018 Chemotherapy     Abiraterone 1000 mg PO daily + Prednisone 5 mg PO BID, given without curative intent (also participating in CHAARTED 2 trial)      06/09/2017 - 10/13/2017 Chemotherapy     Cabazitaxel 25 mg PO IV q21d, given without curative intent as investigational arm of CHAARTED 2 trial      05/21/2018 - 05/27/2018 Radiation     Palliative radiation to L medial iliac bone. Total dose 35 Gy in 5 fractions      07/22/2018 -  Chemotherapy     OP SUPPORT PROSTATE ENZALUTAMIDE  Plan Provider: Risa Grill, MD  Treatment goal: Palliative        History of Present Illness    This is a telehealth (AUDIO & VIDEO) visit secondary to [facility] public health emergency. No tech disruptions during visit. Obtained patient's verbal consent to treat them and their agreement to [facility] financial policy and NPP via this telehealth visit during the [facility] Emergency    Alexander Holland returns for clinical/lab follow up and imaging review via telemedicine while on treatment with Enzalutamide for metastatic, castrate-resistant prostate cancer.     Today he reports that he has been feeling fairly well lately. His fatigue is stable but he feels like he can compensate for this by energy conservation.    Pain is better and he is needing Hydro/APAP at night only.    Denies any other new problems or concerning symptoms. Specifically, he denies any dyspnea, cough, nausea, vomiting, abdominal pain, diarrhea, constipation, or rash.            Review of Systems   HENT: Negative.    Eyes: Negative.    Respiratory: Positive for apnea (Chronic obstructive sleep apnea).    Cardiovascular: Negative.    Gastrointestinal: Negative.    Endocrine: Negative.    Musculoskeletal: Negative.    Skin: Negative.  Allergic/Immunologic: Negative.    Neurological: Negative.    Hematological: Negative.    Psychiatric/Behavioral: Negative.      Objective:      Past medical, surgical, family, and social history have been reviewed with the patient on 11/12/2018 and confirmed accuracy of the information outlined below:  Medical History:   Diagnosis Date   ??? Anemia    ??? Arthritis     DJD-Hip   ??? Asthma    ??? CAD (coronary artery disease)    ??? CHF (congestive heart failure) (HCC) 08/2015   ??? Chronic renal insufficiency    ??? COPD (chronic obstructive pulmonary disease) (HCC)    ??? DM (diabetes mellitus) (HCC)     120's,130's   ??? DOE (dyspnea on exertion)    ??? Ex-smoker    ??? GERD (gastroesophageal reflux disease)    ??? HLD (hyperlipidemia)    ??? HTN (hypertension)    ??? Hypothyroidism    ??? MRSA (methicillin resistant Staphylococcus aureus)    ??? NSTEMI (non-ST elevated myocardial infarction) (HCC)     1997, 2012   ??? Obesity    ??? OSA on CPAP ??? Postherpetic neuralgia      Surgical History:   Procedure Laterality Date   ??? CORONARY STENT PLACEMENT  1997   ??? CARDIAC SURGERY  2012    CABG   ??? CYSTOSCOPY WITH TRANSURETHRAL PROSTATECTOMY N/A 01/05/2016    Performed by Mittie Bodo, [facility] at [facility]   ??? TUNNELED VENOUS PORT PLACEMENT Right 06/06/2017    right EJ port placement per IR   ??? ABDOMEN SURGERY      Umbilical Hernia   ??? HX HEART CATHETERIZATION     ??? LEFT HEART CATHETERIZATION      s/p PCI ~1995     Family History   Problem Relation Age of Onset   ??? Kidney Failure Sister    ??? Tuberculosis Paternal Grandmother    ??? Alcohol abuse Paternal Grandfather    ??? Cancer Other 86        type unknown     Social History     Socioeconomic History   ??? Marital status: Married     Spouse name: Not on file   ??? Number of children: Not on file   ??? Years of education: Not on file   ??? Highest education level: Not on file   Occupational History   ??? Not on file   Tobacco Use   ??? Smoking status: Former Smoker     Packs/day: 1.00     Years: 30.00     Pack years: 30.00     Types: Cigarettes     Last attempt to quit: 02/01/1996     Years since quitting: 22.7   ??? Smokeless tobacco: Never Used   Substance and Sexual Activity   ??? Alcohol use: Yes     Comment: rarely   ??? Drug use: No   ??? Sexual activity: Not on file   Other Topics Concern   ??? Not on file   Social History Narrative   ??? Not on file   Alexander Holland is married and lives with his wife. 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 They have 3 children (2 daughters, 1 son).        ??? ACCU-CHEK SOFTCLIX LANCETS MISC Use  as directed twice daily.     ??? albuterol (VENTOLIN HFA, PROAIR HFA) 90 mcg/actuation inhaler Inhale 2 Puffs by mouth  every 6 hours as needed.     ??? albuterol 0.083% (PROVENTIL; VENTOLIN) 2.5 mg /3 mL (0.083 %) nebulizer solution Inhale 3 mL solution by nebulizer as directed every 4 hours as needed for Wheezing or Shortness of Breath. ??? aspirin EC 81 mg tablet Take 81 mg by mouth at bedtime daily. Take with food.   ??? atorvastatin (LIPITOR) 80 mg tablet Take 80 mg by mouth at bedtime daily.   ??? blood sugar diagnostic (ACCU-CHEK AVIVA PLUS) test strip 1 Strip by Test route before meals and at bedtime.     ??? carvedilol (COREG) 3.125 mg tablet Take one tablet by mouth twice daily with meals. Take with food.   ??? cholecalciferol (VITAMIN D-3) 1,000 units tablet Take 1,000 Units by mouth twice daily.   ??? edoxaban (SAVAYSA) 60 mg tab tablet Take one tablet by mouth daily.   ??? empagliflozin (JARDIANCE) 10 mg tablet Take one tablet by mouth daily.   ??? enzalutamide (XTANDI) 40 mg capsule Take four capsules by mouth daily. Take at the same time every day.   ??? EPINEPHrine (EPIPEN) 1 mg/mL injection pen (2-Pack) Inject 0.3 mg (1 Pen) into thigh if needed for anaphylactic reaction. May repeat in 5-15 minutes if needed.  Indications: a significant type of allergic reaction called anaphylaxis   ??? ferrous sulfate 325 mg (65 mg iron) tablet Take one tablet by mouth twice daily with meals.   ??? furosemide (LASIX) 40 mg tablet Take one tablet by mouth twice daily. Morning and afternoon   ??? gabapentin (NEURONTIN) 600 mg tablet Take one tablet by mouth every 8 hours.   ??? HYDROcodone/acetaminophen (NORCO) 10/325 mg tablet Take one tablet by mouth every 6 hours as needed for Pain   ??? insulin aspart U-100 (NOVOLOG FLEXPEN) 100 unit/mL (3 mL) injection PEN Inject twelve Units under the skin three times daily with meals.   ??? insulin glargine (LANTUS SOLOSTAR) 100 unit/mL (3 mL) injection PEN Inject sixty Units under the skin at bedtime daily.   ??? levothyroxine (SYNTHROID) 25 mcg tablet Take 25 mcg by mouth daily. Before breakfast    ??? lisinopril (PRINIVIL, ZESTRIL) 2.5 mg tablet Take 1 tablet by mouth daily.   ??? metFORMIN (GLUCOPHAGE) 1,000 mg tablet Take 1,000 mg by mouth twice daily with meals.     ??? morphine SR (MS CONTIN) 15 mg tablet Take one tablet by mouth at bedtime daily   ??? nitroglycerin (NITROSTAT) 0.4 mg tablet Place 0.4 mg under tongue every 5 minutes as needed.     ??? omeprazole DR(+) (PRILOSEC) 20 mg capsule Take 20 mg by mouth daily.     ??? ondansetron (ZOFRAN) 8 mg tablet Take one tablet by mouth every 8 hours as needed for Nausea or Vomiting. Indications: nausea and vomiting caused by cancer drugs   ??? oxyCODONE (ROXICODONE) 10 mg tablet Take 1-1.5 tablets by mouth every 3 hours as needed for Pain  Take   ??? senna (SENOKOT) 8.6 mg tablet Take 2 tablets by mouth at bedtime as needed for Constipation.   ??? sertraline (ZOLOFT) 50 mg tablet Take 1 tablet by mouth daily.   ??? triamterene-hydrochlorothiazide (DYAZIDE) 37.5-25 mg capsule Take 1 capsule by mouth every morning.     Vitals:    11/12/18 1619   PainSc: Zero     There is no height or weight on file to calculate BMI.   Pain Score: Zero   Pain Addressed:  Patient to call office if pain not relieved or worsened and Current  regimen working to control pain.    Patient Evaluated for a Clinical Trial: No treatment clinical trial available for this patient.      [place] Cooperative Oncology Group performance status is 1, Restricted in physically strenuous activity but ambulatory and able to carry out work of a light or sedentary nature, e.g., light house work, office work.     Physical Exam    EXAM - VIDEO VISIT  General appearance: no acute distress, awake, no grimace, well-developed, sitting,  Eyes: eyelids open, no discharge, no icterus,   ENT: no nasal drainage, no voice changes,   Lungs: no secretions, no acc muscle use, no apnea,   Neuro: awake, no myoclonus, no masked face nor facial droop  Psych: calm, interactive, normal memory and judgement, good insight, oriented x3 based on conversation  Skin: no jaundice                Data:  I have reviewed the patient's CBC/differential and compared to prior values. The full CBC is as follows:  CBC with Diff Latest Ref Rng & Units 10/12/2018 08/31/2018 07/22/2018 06/08/2018 05/23/2018 WBC 4.5 - 11.0 K/UL 4.5 5.0 5.1 3.0(L) 4.4(L)   RBC 4.4 - 5.5 M/UL 3.16(L) 3.36(L) 3.12(L) 2.96(L) 3.25(L)   HGB 13.5 - 16.5 GM/DL 8.2(L) 9.3(L) 8.9(L) 8.2(L) 8.8(L)   HCT 40 - 50 % 27.4(L) 29.9(L) 28.2(L) 26.1(L) 28.3(L)   MCV 80 - 100 FL 86.6 89.1 90.4 88.2 87.0   MCH 26 - 34 PG 26.0 27.6 28.4 27.6 26.9   MCHC 32.0 - 36.0 G/DL 30.1(L) 31.0(L) 31.5(L) 31.3(L) 30.9(L)   RDW 11 - 15 % 19.1(H) 17.1(H) 18.0(H) 20.2(H) 20.1(H)   PLT 150 - 400 K/UL 166 186 193 163 113(L)   MPV 7 - 11 FL 7.8 7.8 7.8 7.8 8.1   NEUT 41 - 77 % 55 66 70 73 80(H)   ANC 1.8 - 7.0 K/UL 2.60 3.30 3.50 2.20 3.50   LYMA 24 - 44 % 15(L) 16(L) 11(L) 14(L) 9(L)   ALYM 1.0 - 4.8 K/UL 0.70(L) 0.80(L) 0.60(L) 0.40(L) 0.40(L)   MONA 4 - 12 % 10 8 10 9 8    AMONO 0 - 0.80 K/UL 0.40 0.40 0.50 0.30 0.40   EOSA 0 - 5 % 18(H) 9(H) 8(H) 3 3   AEOS 0 - 0.45 K/UL 0.80(H) 0.50(H) 0.40 0.10 0.10   BASA 0 - 2 % 2 1 1 1  0   ABAS 0 - 0.20 K/UL 0.10 0.10 0.10 0.00 0.00       I have reviewed the patient's CMP and compared to prior values. The full CMP is as follows:  CMP Latest Ref Rng & Units 10/12/2018 08/31/2018 07/22/2018 06/08/2018 05/23/2018   NA 137 - 147 MMOL/L 139 139 136(L) 140 142   K 3.5 - 5.1 MMOL/L 4.0 3.7 4.0 3.9 3.8   CL 98 - 110 MMOL/L 101 98 99 100 100   CO2 21 - 30 MMOL/L 29 32(H) 25 31(H) 35(H)   GAP 3 - 12 9 9 12 9 7    BUN 7 - 25 MG/DL 20 17 15 13  32(H)   CR 0.4 - 1.24 MG/DL 6.96 2.95 2.84 1.32 4.40   GLUX 70 - 100 MG/DL 102(V) 25(D) 664(Q) 034(V) 112(H)   CA 8.5 - 10.6 MG/DL 8.8 9.4 9.2 8.7 9.1   TP 6.0 - 8.0 G/DL 7.0 7.3 7.0 6.5 -   ALB 3.5 - 5.0 G/DL 3.7 3.6 4.2(V) 3.2(L) -   ALKP 25 -  110 U/L 166(H) 193(H) 268(H) 379(H) -   ALT 7 - 56 U/L 8 12 14 15  -   TBILI 0.3 - 1.2 MG/DL 0.5 0.6 0.7 0.6 -   GFR >60 mL/min >60 >60 >60 >60 >60   GFRAA >60 mL/min >60 >60 >60 >60 >60     Lab Results   Component Value Date    PSA 2.16 11/06/2018    PSA 1.39 10/12/2018    PSA 0.52 08/31/2018    PSA 0.24 07/22/2018    PSA 0.54 06/08/2018    TESTOSTER <10 (L) 10/12/2018 TESTOSTER 12 (L) 08/31/2018    TESTOSTER <10 (L) 07/22/2018    TESTOSTER 22 (L) 06/08/2018    TESTOSTER 44 (L) 03/09/2018            I personally reviewed images from the patient's CT c/a/p dated 11/06/18, and my impression is that he has increased scintigraphic activity of multiple bone metastases, particularly in R humerus, spine, L pelvis. Formal interpretation is as follows:  IMPRESSION    Increased osseous metastatic disease most compatible tumor progression (given worsening extraosseous disease on current day CT and in the setting of rising tumor markers).    By my electronic signature, I attest that I have personally reviewed the images for this examination and formulated the interpretations and opinions expressed in this report    Finalized by Darrick Meigs, M.D. on 11/06/2018 12:03 PM. Dictated by Merla Riches, D.O. on 11/06/2018 11:33 AM.    CT c/a/p:  IMPRESSION    CHEST:  1. Development of small, irregular bilateral subpleural nodular opacities which may represent atelectasis/scarring or less likely metastatic   disease. Attention on follow-up imaging.    2. Redemonstration of multifocal osseous metastatic disease, with some lesions demonstrating increased sclerosis. Please see separately dictated same-day bone scan for full discussion of osseous findings.    ABDOMEN AND PELVIS:  1. ???Increase in size of left iliac metastatic nodal conglomerate with resultant obstruction of the distal left ureter and worsening, now   moderate, upstream left hydroureteronephrosis.    2. ???Development of a 1.8 cm hypodense lesion in hepatic segment 8, likely a hepatic metastasis.     3. 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    4. ???Redemonstration of multifocal osseous metastatic disease, with some lesions demonstrating increased sclerosis. Please see separately dictated same-day bone scan for full discussion of osseous findings.    By my electronic signature, I attest that I have personally reviewed the images for this examination and formulated the interpretations and opinions expressed in this report    ???Finalized by Caro Hight, M.D. on 11/06/2018 11:14 AM. Dictated by Alinda Dooms, M.D. on 11/06/2018 9:24 AM.      Assessment and Plan: Mr. Belshaw is a 68 y.o. gentleman with history of coronary artery disease who presents regarding evaluation and management of metastatic, castrate-resistant prostate cancer.    1. 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   ???Will therefore discontinue Enzalutamide due to lack of benefit   ???Will pursue liver biopsy out of concern that he could be experiencing neuroendocrine transformation in the setting of a generally low PSA. Will also pursue next generation sequencing from his liver biopsy to evaluate for the presence of mutations that would confer sensitivity to targeted therapies  or immunotherapy.    ???RTC to review biopsy results and discuss further therapy.    2. Pain: LLE pain  ???With both mixed nociceptive and neuropathic/radicular components (neuropathic seems more prominent now).  ???Cont Gabapentin to 600 mg PO TID  ???Cont Hydro/APAP 10/325 mg per his preference     3. Anemia:  ???Continue Ferrous sulfate 325 mg PO q12h, follow up colonoscopy results from GI  ???Continue B12 1000 mg IM monthly thereafter, to be given indefinitely.     4. Neuropathy:  ???Grade 1, due to Cabazitaxel, prior Docetaxel, and diabetes.  ???No treatment indicated except for Gabapentin being used for radicular LLE pain.    5. Fatigue:  ???Due to cancer, continue energy conservation    6. Arthralgias:  ???Continue Hydro/APAP PRN    Goals of care: 06/08/18: Stated goal is longevity.  He stated that he is willing to accept the risk of medical burden (new symptoms, hospitalizations) in pursuit of this goal.           Total face-to-face time for visit: 18 minutes, all (> 50%) of which was spent in education and counseling with patient via telemedicine per [facility] COVID-19 policy, which incorporates guidelines from the Centers for Disease Control [CDC]. Discussed the patient's cancer diagnosis, management, progressive cancer, pain, fatigue, and reviewed goals.

## 2018-11-13 ENCOUNTER — Encounter: Admit: 2018-11-13 | Discharge: 2018-11-13

## 2018-11-13 NOTE — Progress Notes
Interventional Radiology Outpatient Procedure Review Assessment        Reason for consult: Evaluate for liver mass biopsy in setting of prostate cancer.      History of present illness:  Alexander Holland is a 68 y.o. male patient with a history significant for prostate cancer.      Assessment:   - Review of imaging: CT 11/06/2018: R hepatic segment 7/8 lesion (3/18)   - Review of recent labs and allergies: Allergic to gadolinium.  - Review of anticoagulation medications: ASA 81 mg.  - Previous ASA score if available: 3 (If ASA IV, anesthesia will run sedation for procedure if sedation indicated).  - Previous IR Anesthetic/Sedation History (if applicable): Conscious sedation.  - Case discussed with referring Physician (Y/N): N  - Contraindications to procedure: None      Plan:  - This case has been reviewed and approved for liver biopsy  - Patient position and modality: [place] supine attempt first, if unavailable will need to complete in CT.  - Intra-procedural Sedation/Medication Plan: Fentanyl and Midazolam  - Procedure time frame: 10-14 days   - Anticoagulation: Per IR peri procedural anticoagulation management protocol  - Labs: Per IR pre procedural lab protocol  - Additional requests after review: None    Lab/Radiology/Other Diagnostic Tests:  Labs:  Pertinent labs reviewed             Jiles Prows, MD

## 2018-11-16 ENCOUNTER — Encounter: Admit: 2018-11-16 | Discharge: 2018-11-16

## 2018-11-16 DIAGNOSIS — Z1159 Encounter for screening for other viral diseases: Secondary | ICD-10-CM

## 2018-11-17 ENCOUNTER — Encounter: Admit: 2018-11-17 | Discharge: 2018-11-18

## 2018-11-17 ENCOUNTER — Encounter: Admit: 2018-11-17 | Discharge: 2018-11-17

## 2018-11-17 DIAGNOSIS — Z1159 Encounter for screening for other viral diseases: Principal | ICD-10-CM

## 2018-11-17 NOTE — Progress Notes
Patient arrived to [facility] for COVID-19 testing 11/17/18 1250. Patient identity confirmed via photo I.D. Nasopharyngeal procedure explained to the patient.   Nasopharyngeal swab completed right  Patient education provided given and instructed patient self isolate until contacted w/ results and further instructions.   Swab collected by sheri geary.    Date symptoms began/reason for testing: preop

## 2018-11-17 NOTE — Telephone Encounter
Pt given follow up appt for 8/13 in person to review pathology from lover biopsy on 8/6 and verbalizes understanding

## 2018-11-18 ENCOUNTER — Encounter: Admit: 2018-11-18 | Discharge: 2018-11-18

## 2018-11-18 DIAGNOSIS — E039 Hypothyroidism, unspecified: Secondary | ICD-10-CM

## 2018-11-18 DIAGNOSIS — I509 Heart failure, unspecified: Secondary | ICD-10-CM

## 2018-11-18 DIAGNOSIS — K219 Gastro-esophageal reflux disease without esophagitis: Secondary | ICD-10-CM

## 2018-11-18 DIAGNOSIS — A4902 Methicillin resistant Staphylococcus aureus infection, unspecified site: Secondary | ICD-10-CM

## 2018-11-18 DIAGNOSIS — J449 Chronic obstructive pulmonary disease, unspecified: Secondary | ICD-10-CM

## 2018-11-18 DIAGNOSIS — R0600 Dyspnea, unspecified: Secondary | ICD-10-CM

## 2018-11-18 DIAGNOSIS — J45909 Unspecified asthma, uncomplicated: Secondary | ICD-10-CM

## 2018-11-18 DIAGNOSIS — I10 Essential (primary) hypertension: Secondary | ICD-10-CM

## 2018-11-18 DIAGNOSIS — E785 Hyperlipidemia, unspecified: Secondary | ICD-10-CM

## 2018-11-18 DIAGNOSIS — I214 Non-ST elevation (NSTEMI) myocardial infarction: Secondary | ICD-10-CM

## 2018-11-18 DIAGNOSIS — G4733 Obstructive sleep apnea (adult) (pediatric): Secondary | ICD-10-CM

## 2018-11-18 DIAGNOSIS — E119 Type 2 diabetes mellitus without complications: Secondary | ICD-10-CM

## 2018-11-18 DIAGNOSIS — I2510 Atherosclerotic heart disease of native coronary artery without angina pectoris: Secondary | ICD-10-CM

## 2018-11-18 DIAGNOSIS — D649 Anemia, unspecified: Secondary | ICD-10-CM

## 2018-11-18 DIAGNOSIS — N189 Chronic kidney disease, unspecified: Secondary | ICD-10-CM

## 2018-11-18 DIAGNOSIS — Z87891 Personal history of nicotine dependence: Secondary | ICD-10-CM

## 2018-11-18 DIAGNOSIS — M1990 Unspecified osteoarthritis, unspecified site: Secondary | ICD-10-CM

## 2018-11-18 DIAGNOSIS — E669 Obesity, unspecified: Secondary | ICD-10-CM

## 2018-11-18 DIAGNOSIS — B0229 Other postherpetic nervous system involvement: Secondary | ICD-10-CM

## 2018-11-18 DIAGNOSIS — C61 Malignant neoplasm of prostate: Secondary | ICD-10-CM

## 2018-11-18 LAB — COVID-19 (SARS-COV-2) PCR

## 2018-11-18 NOTE — Progress Notes
Hereditary Cancer Clinic   Genetic Counseling  Date: 11/18/2018  Alexander Holland  Mackay# [id number]  DOB 06-06-1950        Alexander Holland, age 68 y.o., was seen in telehealth clinic today for a discussion of the results of the genetic testing that was performed during our previous visit.     Discussion: During our last visit we ordered the  [employer] Panel: (14 genes: ATM, BRCA1, BRCA2, CHEK2, EPCAM, HOXB13, MLH1, MSH2, MSH6, NBN, PALB2, RAD51D and TP53). Alexander Holland's testing was NEGATIVE, which means he was not found to have a mutation in any of these genes. Testing was ordered for Alexander Holland based on his personal history of prostate cancer.     Alexander Holland's testing did not find a mutation in any of the known genes associated with hereditary prostate cancer syndromes. This means that at this time we cannot explain the cause for [name]'s prostate cancer. Alexander Holland's male relatives should speak with their primary care physicians about the family history of aggressive prostate cancer and determine what the most appropriate screenings would be for them.      It is still possible that Alexander Holland's children could have inherited a hereditary cancer syndrome from their mother's family. If there is a maternal family history of cancer we recommend they consider seeing a Dietitian to review the family history and determine if genetic testing is appropriate.       Plan: Alexander Holland can reach out to our clinic in 1-2 years to determine if there are newer genes for prostate cancer susceptibility available for testing.     Time: 15 minutes. Discussed test results and implications for family members. Determined who in the family still should consider pursuing genetic testing.         [name], MS, [facility]  Certified Genetic Counselor  [facility]  [address] [place], [place] [address]    Email: [email or web address]  Telephone [phone]  Fax: [phone]  Appointments [phone]    Note:

## 2018-11-19 ENCOUNTER — Encounter: Admit: 2018-11-19 | Discharge: 2018-11-19

## 2018-11-19 ENCOUNTER — Encounter: Admit: 2018-11-06 | Discharge: 2018-11-07

## 2018-11-19 ENCOUNTER — Encounter: Admit: 2018-11-06 | Discharge: 2018-11-06

## 2018-11-19 DIAGNOSIS — E119 Type 2 diabetes mellitus without complications: Secondary | ICD-10-CM

## 2018-11-19 DIAGNOSIS — E669 Obesity, unspecified: Secondary | ICD-10-CM

## 2018-11-19 DIAGNOSIS — K219 Gastro-esophageal reflux disease without esophagitis: Secondary | ICD-10-CM

## 2018-11-19 DIAGNOSIS — I214 Non-ST elevation (NSTEMI) myocardial infarction: Secondary | ICD-10-CM

## 2018-11-19 DIAGNOSIS — I10 Essential (primary) hypertension: Secondary | ICD-10-CM

## 2018-11-19 DIAGNOSIS — E1140 Type 2 diabetes mellitus with diabetic neuropathy, unspecified: Secondary | ICD-10-CM

## 2018-11-19 DIAGNOSIS — I509 Heart failure, unspecified: Secondary | ICD-10-CM

## 2018-11-19 DIAGNOSIS — B0229 Other postherpetic nervous system involvement: Secondary | ICD-10-CM

## 2018-11-19 DIAGNOSIS — J449 Chronic obstructive pulmonary disease, unspecified: Secondary | ICD-10-CM

## 2018-11-19 DIAGNOSIS — N189 Chronic kidney disease, unspecified: Secondary | ICD-10-CM

## 2018-11-19 DIAGNOSIS — C7951 Secondary malignant neoplasm of bone: Secondary | ICD-10-CM

## 2018-11-19 DIAGNOSIS — C61 Malignant neoplasm of prostate: Principal | ICD-10-CM

## 2018-11-19 DIAGNOSIS — E785 Hyperlipidemia, unspecified: Secondary | ICD-10-CM

## 2018-11-19 DIAGNOSIS — R0600 Dyspnea, unspecified: Secondary | ICD-10-CM

## 2018-11-19 DIAGNOSIS — M1990 Unspecified osteoarthritis, unspecified site: Secondary | ICD-10-CM

## 2018-11-19 DIAGNOSIS — D649 Anemia, unspecified: Secondary | ICD-10-CM

## 2018-11-19 DIAGNOSIS — E039 Hypothyroidism, unspecified: Secondary | ICD-10-CM

## 2018-11-19 DIAGNOSIS — Z87891 Personal history of nicotine dependence: Secondary | ICD-10-CM

## 2018-11-19 DIAGNOSIS — Z006 Encounter for examination for normal comparison and control in clinical research program: Secondary | ICD-10-CM

## 2018-11-19 DIAGNOSIS — I2510 Atherosclerotic heart disease of native coronary artery without angina pectoris: Secondary | ICD-10-CM

## 2018-11-19 DIAGNOSIS — J45909 Unspecified asthma, uncomplicated: Secondary | ICD-10-CM

## 2018-11-19 DIAGNOSIS — A4902 Methicillin resistant Staphylococcus aureus infection, unspecified site: Secondary | ICD-10-CM

## 2018-11-19 DIAGNOSIS — G4733 Obstructive sleep apnea (adult) (pediatric): Secondary | ICD-10-CM

## 2018-11-19 NOTE — Telephone Encounter
Contacted wife to discuss pt oxygen needs. She reports they have a concentrator at home, portable oxygen and capability to hook up to his CPAP at night as well. She notes pt has "weaned himself off oxygen and is not using it." Reinforced  to wife that his oxygen saturation confirms he needs his oxygen continuously and wife will discuss with pt . Also discussed they need to follow up with pulmonary either at [facility] or [place] for further evaluation. Wife verbalizes understanding.

## 2018-11-20 ENCOUNTER — Encounter: Admit: 2018-11-20 | Discharge: 2018-11-20

## 2018-11-20 MED FILL — EDOXABAN 60 MG PO TAB: 60 mg | ORAL | 30 days supply | Qty: 30 | Fill #4 | Status: AC

## 2018-11-23 NOTE — Patient Education
Dear Alexander Holland,    Thank you for choosing [facility] Interventional Radiology for your procedure. Your appointment information is listed below:    Appointment Date: 11/30/2018  Appointment Time: 3PM  Arrival Time: 2PM  Location:     ? Main Campus: [address], [place], [place]  [address]  Parking: [address] Parking Garage    INTERVENTIONAL RADIOLOGY  PRE-PROCEDURE INSTRUCTIONS SEDATION    You are scheduled for a procedure in Interventional Radiology with procedural sedation.  Please follow these instructions and any direction from your Primary Care/Managing Physician.  If you have questions about your procedure or need to reschedule please call [phone].    Medication Instructions:   You may take the following medications with a small sip of water:  Continue taking your normal morning medications as directed.  As for your Savaysa, please  Hold Sunday 8/16 and Monday 11/30/2018 dose.  You may resume taking 24 hours after completion of your procedure.   For your Diabetic Medications, please follow the guidelines established by your physician for when you are fasting.    Diet Instructions:   NOTHING to Eat or Drink after 7AM  a.   You should have nothing by mouth. This includes GUM or CANDY.       Day of Exam Instructions:  1. Bathe or shower with an antibacterial soap prior to your appointment.  2. If you have a history of Obstructive Sleep Apnea (OSA) bring your CPAP/BIPAP.   3. Bring a list of your current medications and the dosages.  4. Wear comfortable clothing and leave valuables at home.  5. Arrive (1) hour prior to your appointment.  This time will be spent registering, interviewing, assessing, educating and preparing you for the test.  ? You will be with [place] anywhere from 30 minutes to 6 hours after your exam depending on your procedure.  6. You may be sedated for the procedure. A responsible adult must drive you home (no Benedetto Goad, taxis or buses are allowed) and stay with you overnight. If you do not have a driver we will be unable to perform your procedure.   7. You will not be able to return to work or drive the same day if receiving sedation.

## 2018-11-23 NOTE — Progress Notes
Interventional Radiology Outpatient Scheduling Checklist      1.  Name of Procedure(s):   US Guided Liver Mass Biopsy with Anesthesia.  NO New COVID screening per Dr Ree Kida.  Patient aware of an extended recovery.   Biopsy originally scheduled for 11/19/2018 but was aborted due respiratory compromise and Dr Val Eagle recommending Anesthesia at [facility] for case.    11/13/2018 9:36 AM CDT by Richarda Osmond., MD     Approved? Yes   Attending Provider Reviewing the Case: Rohr   Case Type: Body   Modality: Ultrasound   Position: Supine   Sedation Type: Moderate   Preferred Location: Any   Physician: Any   Recommended Specimen Core   Comments Will attempt [place] if not may need CT           2.  Date of Procedure:   11/30/2018      3.  Arrival Time:   1400      4.  Procedure Time:  1500      5.  Correct Procedural Room Assignment:  IR [facility] Room #6      6.  Blood Thinners Triaged and instructed per protocol: Y/N/NA:   Patient on Savaysa, Hold x2 doses per protocol Starting 11/29/2018  Confirmed accurate instructions sent to patient: Y/N:  YES      7.  Procedure Order Verified: Y/N:  Yes      9.  Patient instructed to have a driver: Y/N/NA:  Yes    10.  Patient instructed on NPO status: Y/N/NA:  Yes  NPO after 0700  Confirmed accurate instructions sent to patient: Y/N:  Yes    11.  Specimen needed: Y/N/NA:  Yes   Verified Order placed: Y/N:  Yes    12.  Allergies Verified:  Y/N:  Yes    13.  Is there an Iodine Allergy: Y/N:  No  Does the Procedure Require contrast: Y/N:  NO  If so, was the IR- Contrast Allergy Pre-Procedure Medication protocol ordered: Y/NA:  NA    14.  Does the patient have labs according to IR Pre-procedure Laboratory Parameter policy: Y/N/NA:  Yes  If No, was the patient instructed to obtain labs prior to procedure: Y/N/NA:  NA     15.  Will the patient need to be admitted or have a possible admission: Y/N:  No  If yes, confirmed accurate instructions sent to patient: Y/N/NA:  NA 16.  Patient States Understanding: Y/N:  Yes    17.  History of OSA:  Y/N: YES  If yes, confirm request to bring CPAP sent to patient: Y/N/NA: YES    18. Patient declines electronic procedure instructions: Y/N:  No

## 2018-11-24 ENCOUNTER — Encounter: Admit: 2018-11-24 | Discharge: 2018-11-24

## 2018-11-24 DIAGNOSIS — C61 Malignant neoplasm of prostate: Secondary | ICD-10-CM

## 2018-11-24 MED ORDER — ENZALUTAMIDE 40 MG PO CAP
160 mg | ORAL_CAPSULE | Freq: Every day | ORAL | 3 refills | Status: CN
Start: 2018-11-24 — End: ?

## 2018-11-24 NOTE — Telephone Encounter
Patient notified of recent lab results, verbalized understanding and will call with any other questions or concerns.

## 2018-11-24 NOTE — Telephone Encounter
-----   Message from Liane Comber, [place] sent at 11/06/2018 12:20 PM CDT -----  Results released to MyChart and commented there

## 2018-11-27 ENCOUNTER — Encounter: Admit: 2018-11-27 | Discharge: 2018-11-27

## 2018-11-27 DIAGNOSIS — C61 Malignant neoplasm of prostate: Secondary | ICD-10-CM

## 2018-11-27 MED ORDER — ENZALUTAMIDE 40 MG PO CAP
160 mg | ORAL_CAPSULE | Freq: Every day | ORAL | 3 refills | Status: DC
Start: 2018-11-27 — End: 2019-01-02
  Filled 2018-11-30: qty 120, 30d supply, fill #1

## 2018-11-27 MED ORDER — ENZALUTAMIDE 40 MG PO CAP
160 mg | ORAL_CAPSULE | Freq: Every day | ORAL | 3 refills | Status: CN
Start: 2018-11-27 — End: ?

## 2018-11-29 DIAGNOSIS — K7689 Other specified diseases of liver: Secondary | ICD-10-CM

## 2018-11-30 ENCOUNTER — Encounter: Admit: 2018-11-30 | Discharge: 2018-11-30 | Payer: MEDICARE

## 2018-11-30 ENCOUNTER — Ambulatory Visit: Admit: 2018-11-30 | Discharge: 2018-12-01 | Payer: MEDICARE

## 2018-11-30 ENCOUNTER — Ambulatory Visit: Admit: 2018-11-30 | Discharge: 2018-11-30 | Payer: MEDICARE

## 2018-11-30 DIAGNOSIS — E039 Hypothyroidism, unspecified: Secondary | ICD-10-CM

## 2018-11-30 DIAGNOSIS — D649 Anemia, unspecified: Secondary | ICD-10-CM

## 2018-11-30 DIAGNOSIS — G4733 Obstructive sleep apnea (adult) (pediatric): Secondary | ICD-10-CM

## 2018-11-30 DIAGNOSIS — E119 Type 2 diabetes mellitus without complications: Secondary | ICD-10-CM

## 2018-11-30 DIAGNOSIS — J449 Chronic obstructive pulmonary disease, unspecified: Secondary | ICD-10-CM

## 2018-11-30 DIAGNOSIS — C61 Malignant neoplasm of prostate: Secondary | ICD-10-CM

## 2018-11-30 DIAGNOSIS — E785 Hyperlipidemia, unspecified: Secondary | ICD-10-CM

## 2018-11-30 DIAGNOSIS — M1990 Unspecified osteoarthritis, unspecified site: Secondary | ICD-10-CM

## 2018-11-30 DIAGNOSIS — I214 Non-ST elevation (NSTEMI) myocardial infarction: Secondary | ICD-10-CM

## 2018-11-30 DIAGNOSIS — J45909 Unspecified asthma, uncomplicated: Secondary | ICD-10-CM

## 2018-11-30 DIAGNOSIS — I509 Heart failure, unspecified: Secondary | ICD-10-CM

## 2018-11-30 DIAGNOSIS — A4902 Methicillin resistant Staphylococcus aureus infection, unspecified site: Secondary | ICD-10-CM

## 2018-11-30 DIAGNOSIS — E669 Obesity, unspecified: Secondary | ICD-10-CM

## 2018-11-30 DIAGNOSIS — Z87891 Personal history of nicotine dependence: Secondary | ICD-10-CM

## 2018-11-30 DIAGNOSIS — B0229 Other postherpetic nervous system involvement: Secondary | ICD-10-CM

## 2018-11-30 DIAGNOSIS — I2510 Atherosclerotic heart disease of native coronary artery without angina pectoris: Secondary | ICD-10-CM

## 2018-11-30 DIAGNOSIS — R0600 Dyspnea, unspecified: Secondary | ICD-10-CM

## 2018-11-30 DIAGNOSIS — K7689 Other specified diseases of liver: Secondary | ICD-10-CM

## 2018-11-30 DIAGNOSIS — K219 Gastro-esophageal reflux disease without esophagitis: Secondary | ICD-10-CM

## 2018-11-30 DIAGNOSIS — N189 Chronic kidney disease, unspecified: Secondary | ICD-10-CM

## 2018-11-30 DIAGNOSIS — R160 Hepatomegaly, not elsewhere classified: Secondary | ICD-10-CM

## 2018-11-30 DIAGNOSIS — I10 Essential (primary) hypertension: Secondary | ICD-10-CM

## 2018-11-30 LAB — POC PT/INR: Lab: 1.2 (ref 0.8–1.2)

## 2018-11-30 LAB — POC GLUCOSE: Lab: 173 mg/dL — ABNORMAL HIGH (ref 70–100)

## 2018-11-30 MED ORDER — SODIUM CHLORIDE 0.9 % IV SOLP
0 refills | Status: DC
Start: 2018-11-30 — End: 2018-11-30
  Administered 2018-11-30: 22:00:00 via INTRAVENOUS

## 2018-11-30 MED ORDER — PROPOFOL 10 MG/ML IV EMUL 20 ML (INFUSION)(AM)(OR)
INTRAVENOUS | 0 refills | Status: DC
Start: 2018-11-30 — End: 2018-11-30

## 2018-11-30 MED ORDER — PROPOFOL INJ 10 MG/ML IV VIAL
0 refills | Status: DC
Start: 2018-11-30 — End: 2018-11-30
  Administered 2018-11-30: 22:00:00 10 mg via INTRAVENOUS
  Administered 2018-11-30 (×2): 20 mg via INTRAVENOUS
  Administered 2018-11-30: 22:00:00 10 mg via INTRAVENOUS

## 2018-11-30 NOTE — Other
Immediate Post Procedure Note    Date:  11/30/2018                                         Performing Provider:  April Manson, MD    Consent:  Consent obtained from patient.  Time out performed: Consent obtained, correct patient verified, correct procedure verified, correct site verified, patient marked as necessary.  Pre/Post Procedure Diagnosis:  Liver mass biopsy  Indications:  History of prostate cancer    Anesthesia: MAC (Monitored Anesthesia Care)  Procedure(s):  Sono guided liver mass biopsy  Findings:  Two 18 g core biopsies right lobe of liver mass     Estimated Blood Loss:  None/Negligible  Specimen(s) Removed/Disposition:  Yes, sent to pathology  Complications: None  Patient Tolerated Procedure: Well  Post-Procedure Condition:  stable    April Manson, MD  Pager [phone]

## 2018-11-30 NOTE — H&P (View-Only)
Pre Procedure History and Physical/Sedation Plan      Procedure Date:  11/30/2018    Planned Procedure(s): US guided liver mass biopsy    Indication for exam: Liver mass, hx prostate cancer  ________________________________________________________________    Chief Complaint:   See above     Previous Anesthetic/Sedation History:  Per anesthesia.      Allergies:  Gadolinium-containing contrast media and Multihance [gadobenate dimeglumine]  Medications:  Scheduled Meds:Continuous Infusions:  PRN and Respiratory Meds:       Vital Signs:  Last Filed Vital Signs: 24 Hour Range           Pre-procedure anxiolysis plan: Per anesthesia.    Sedation/Medication Plan: Per anesthesia.    Personal history of sedation complications: Per anesthesia.    Family history of sedation complications: Per anesthesia.    Medications for Reversal:Per anesthesia.    Discussion/Reviews:  Physician has discussed risks and alternatives of this type of sedation and above planned procedures with patient    NPO Status: Per anesthesia.    Airway:  Per anesthesia.    Head and Neck: Per anesthesia.    Mouth: Per anesthesia.    Anesthesia Classification:  Per anesthesia.    Pregnancy Status: N/A    Lab/Radiology/Other Diagnostic Tests  Labs:  Relevant labs reviewed. POC INR 1.2 today.     I have examined the patient, and there are no significant changes in their condition, from the previous H&P performed on 11/19/2018.     Procedure cxl at [facility] due to low SPO2 on room air and re-scheduled with anesthesia at main campus. Patient with SPO2 94% on arrival to [facility] today.     Jerolyn Center, [facility]  Pager [phone]

## 2018-11-30 NOTE — Anesthesia Pre-Procedure Evaluation
Anesthesia Pre-Procedure Evaluation    Name: Alexander Holland      MRN: [id number]     DOB: 10/26/1950     Age: 68 y.o.     Sex: male   __________________________________________________________________________     Procedure Date: 11/30/2018   Procedure: * No procedures listed *     Physical Assessment  Vital Signs (last filed in past 24 hours):  BP: 137/74 (08/17 1515)  Temp: 37.1 ???C (98.8 ???F) (08/17 1515)  Pulse: 72 (08/17 1515)  Respirations: 17 PER MINUTE (08/17 1515)  SpO2: 94 % (08/17 1515)      Patient History  Allergies   Allergen Reactions   ??? Gadolinium-Containing Contrast Media ANAPHYLAXIS     Pt coded after receiving contrast medium in MRI- If Contrast MRI study needed, pt MUST be scheduled at main campus with pre-medications and radiologist recomendations   ??? Multihance [Gadobenate Dimeglumine] ANAPHYLAXIS     Pt coded after receiving contrast medium in MRI- If Contrast MRI study needed, pt MUST be scheduled at main campus with pre-medications and radiologist recomendations        Current Medications    Medication Directions   ACCU-CHEK SOFTCLIX LANCETS MISC Use  as directed twice daily.     albuterol (VENTOLIN HFA, PROAIR HFA) 90 mcg/actuation inhaler Inhale 2 Puffs by mouth every 6 hours as needed.     albuterol 0.083% (PROVENTIL; VENTOLIN) 2.5 mg /3 mL (0.083 %) nebulizer solution Inhale 3 mL solution by nebulizer as directed every 4 hours as needed for Wheezing or Shortness of Breath.   aspirin EC 81 mg tablet Take 81 mg by mouth at bedtime daily. Take with food.   atorvastatin (LIPITOR) 80 mg tablet Take 80 mg by mouth at bedtime daily.   blood sugar diagnostic (ACCU-CHEK AVIVA PLUS) test strip 1 Strip by Test route before meals and at bedtime.     carvedilol (COREG) 3.125 mg tablet Take one tablet by mouth twice daily with meals. Take with food.   cholecalciferol (VITAMIN D-3) 1,000 units tablet Take 1,000 Units by mouth twice daily. edoxaban (SAVAYSA) 60 mg tab tablet Take one tablet by mouth daily.   empagliflozin (JARDIANCE) 10 mg tablet Take one tablet by mouth daily.   enzalutamide (XTANDI) 40 mg capsule Take four capsules by mouth daily. Take at the same time every day.   EPINEPHrine (EPIPEN) 1 mg/mL injection pen (2-Pack) Inject 0.3 mg (1 Pen) into thigh if needed for anaphylactic reaction. May repeat in 5-15 minutes if needed.  Indications: a significant type of allergic reaction called anaphylaxis   ferrous sulfate 325 mg (65 mg iron) tablet Take one tablet by mouth twice daily with meals.   furosemide (LASIX) 40 mg tablet Take one tablet by mouth twice daily. Morning and afternoon   gabapentin (NEURONTIN) 600 mg tablet Take one tablet by mouth every 8 hours.   HYDROcodone/acetaminophen (NORCO) 10/325 mg tablet Take one tablet by mouth every 6 hours as needed for Pain   insulin aspart U-100 (NOVOLOG FLEXPEN) 100 unit/mL (3 mL) injection PEN Inject twelve Units under the skin three times daily with meals.   insulin glargine (LANTUS SOLOSTAR) 100 unit/mL (3 mL) injection PEN Inject sixty Units under the skin at bedtime daily.   levothyroxine (SYNTHROID) 25 mcg tablet Take 25 mcg by mouth daily. Before breakfast    lisinopril (PRINIVIL, ZESTRIL) 2.5 mg tablet Take 1 tablet by mouth daily.   metFORMIN (GLUCOPHAGE) 1,000 mg tablet Take 1,000 mg by mouth twice daily with  meals.     morphine SR (MS CONTIN) 15 mg tablet Take one tablet by mouth at bedtime daily   nitroglycerin (NITROSTAT) 0.4 mg tablet Place 0.4 mg under tongue every 5 minutes as needed.     omeprazole DR(+) (PRILOSEC) 20 mg capsule Take 20 mg by mouth daily.     ondansetron (ZOFRAN) 8 mg tablet Take one tablet by mouth every 8 hours as needed for Nausea or Vomiting. Indications: nausea and vomiting caused by cancer drugs   oxyCODONE (ROXICODONE) 10 mg tablet Take 1-1.5 tablets by mouth every 3 hours as needed for Pain  Take senna (SENOKOT) 8.6 mg tablet Take 2 tablets by mouth at bedtime as needed for Constipation.   sertraline (ZOLOFT) 50 mg tablet Take 1 tablet by mouth daily.   triamterene-hydrochlorothiazide (DYAZIDE) 37.5-25 mg capsule Take 1 capsule by mouth every morning.         Review of Systems/Medical History      Patient summary reviewed  Nursing notes reviewed  Pertinent labs reviewed    PONV Screening: Non-smoker  No history of anesthetic complications  No family history of anesthetic complications      Airway - negative        Pulmonary       Smoker: quit 1997, 30 pack year history.        Asthma (controlled, albuterol 1-2times/week)    COPD      No indications/hx of pneumonia (2012)      No shortness of breath      Home oxygen use (3L )        Sleep apnea          Interventions: CPAP; compliant      Last attempt at this procedure cancelled 2/2 hypoxemia      Cardiovascular       Recent diagnostic studies:          echocardiogram          1. Technically difficult study with poor endocardial visualization.  2. Normal left ventricular cavity size with moderately reduced function  3. Estimated ejection fraction 40 to 45%  4. Grade 1 diastolic dysfunction  5. Valvular structures not well seen but no significant valvular Doppler abnormalities are noted within the limitations of the study  6. No pericardial effusion  7. 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    Abdominal Findings:       Obese    Neurological Findings:       Alert and oriented x 3    Normal mental status    Constitutional findings:       No acute distress       Diagnostic Tests  Hematology:   Lab Results   Component Value Date    HGB 8.2 10/12/2018    HCT 27.4 10/12/2018    PLTCT 166 10/12/2018    WBC 4.5 10/12/2018    NEUT 55 10/12/2018    ANC 2.60 10/12/2018    LYMPH 11 06/30/2017    ALC 0.70 10/12/2018    MONA 10 10/12/2018    AMC 0.40 10/12/2018    EOSA 18 10/12/2018    ABC 0.10 10/12/2018    BASOPHILS 1 06/30/2017    MCV 86.6 10/12/2018    MCH 26.0 10/12/2018    MCHC 30.1 10/12/2018    MPV 7.8 10/12/2018    RDW 19.1 10/12/2018         General Chemistry:   Lab Results   Component Value Date    NA 139 10/12/2018    K 4.0 10/12/2018    CL 101 10/12/2018    CO2 29 10/12/2018    GAP 9 10/12/2018    BUN 20 10/12/2018    CR 1.3 11/06/2018    CR 1.03 10/12/2018    GLU 142 10/12/2018    CA 8.8 10/12/2018    ALBUMIN 3.7 10/12/2018    LACTIC 1.5 02/20/2011    MG 1.9 05/18/2018    TOTBILI 0.5 10/12/2018    PO4 2.1 06/08/2018      Coagulation:   Lab Results   Component Value Date    PTT 30.0 05/18/2018    INR 1.2 11/30/2018    INR 1.3 05/18/2018         Anesthesia Plan    ASA score: 3   Plan: MAC  Induction method: intravenous  NPO status: acceptable      Informed Consent  Anesthetic plan and risks discussed with patient.  Use of blood products discussed with patient Plan discussed with: anesthesiologist and CRNA.

## 2018-11-30 NOTE — Progress Notes
IR puncture site to RUQ from liver mass biopsy dressed with 4x4 gauze and tegaderm.

## 2018-11-30 NOTE — Anesthesia Post-Procedure Evaluation
Post-Anesthesia Evaluation    Name: Alexander Holland      MRN: [id number]     DOB: 08-27-1950     Age: 68 y.o.     Sex: male   __________________________________________________________________________     Procedure Information     Anesthesia Start Date/Time:  11/30/18 3825    Scheduled providers:  Stephani Police, MD; Mingo Amber, RN; Donnetta Hail, RT(R)(VI),LRT; April Manson, MD    Procedure:  IR LIVER BIOPSY    Location:  [facility] Radiology          Post-Anesthesia Vitals  BP: 143/64 [id number] 1730)  Temp: 36.8 C (98.3 F) (08/17 1720)  Pulse: 68 (08/17 1730)  Respirations: 17 PER MINUTE (08/17 1730)  SpO2: 96 % (08/17 1730)  SpO2 Pulse: 68 (08/17 1730)   Vitals Value Taken Time   BP 143/64 11/30/2018  5:30 PM   Temp 36.8 C (98.3 F) 11/30/2018  5:20 PM   Pulse 68 11/30/2018  5:30 PM   Respirations 17 PER MINUTE 11/30/2018  5:30 PM   SpO2 96 % 11/30/2018  5:30 PM         Post Anesthesia Evaluation Note    Evaluation location: Pre/Post  Patient participation: recovered; patient participated in evaluation  Level of consciousness: alert    Pain score: 1  Pain management: adequate    Hydration: normovolemia  Temperature: 36.0C - 38.4C  Airway patency: adequate    Perioperative Events       Post-op nausea and vomiting: no PONV    Postoperative Status  Cardiovascular status: hemodynamically stable  Respiratory status: spontaneous ventilation  Follow-up needed: none        Perioperative Events  Perioperative Event: No  Emergency Case Activation: No

## 2018-11-30 NOTE — Progress Notes
Anesthesia staff present for procedure of Liver Mass biopsy in IR.  Please refer to anesthesia doc flowsheets, emar and notes for continued monitoring of patient.  This RN available to help.

## 2018-11-30 NOTE — Patient Instructions
INTERVENTIONAL RADIOLOGY DISCHARGE INSTRUCTIONS  PERCUTANEOUS LIVER BIOPSY???  A percutaneous liver biopsy is a procedure in which a tiny sample of your liver tissue is taken by using a needle inserted through your skin into your liver.??? The radiologist will determine whether the procedure is to be done using ultrasound or CT guidance.??? The procedure usually lasts less than one hour with a 2-4 hour recovery period before you are allowed to return home.???  POST-PROCEDURE ACTIVITY:  ??? A responsible adult must drive you home.?????? If you receive sedation, you should not drive or operate heavy machinery or do anything that requires concentration for at least 24 hours after receiving sedation.  ??? It is recommended that a responsible adult be with you until morning.  ??? Avoid lifting more than 5 lbs. for 1 week and avoid exercises that use your abdominal muscles.??? Also avoid pushing, pulling or straining.???  POST-PROCEDURE SITE CARE:  ??? You will have a small bandage over the site.??? Keep this dry.??? You may remove it in 24 hours.  ??? You may shower in 24 hours, after removing the bandage.  ??? Do not submerge the site underwater for 1 week (no swimming/hot tub, etc.)  ??? Be sure your hands are clean when touching near the site.  ??? Do not use ointments, creams or powders on the puncture site.???  DIET/MEDICATIONS:  ??? You may resume your previous diet 1-2 hours after the procedure.  ??? If you receive sedation or narcotic pain medications, avoid any foods or beverages containing alcohol for at least 24 hours after the procedure.  ??? Please see the Medication Reconciliation sheet for instructions on resuming your home medications.???  CALL THE DOCTOR IF:???  ??? Bright red blood has soaked the bandage.  ??? You have severe pain not relieved by medication.??? Some soreness or tenderness at the site is to be expected for several days.  ??? You have persistent nausea or vomiting.  ??? You have signs of infection such as: ??? Chills, fever greater than 101?F, body aches, redness, swelling or warmth at the puncture site or pus draining from the site.  ??? You have new or worse belly swelling or bloating.???  You or your caregiver should call 911 for any severe symptoms such as excessive bleeding, severe dizziness, trouble breathing or loss of consciousness.  For any of the above symptoms or for problems or concerns related to the procedure,??? call??? [phone] for Monday-Friday 7-5.??? After-hours and weekends, please call??? [phone] and ask for the Interventional Radiology Resident on-call.

## 2018-12-01 ENCOUNTER — Ambulatory Visit: Admit: 2018-11-29 | Discharge: 2018-11-29

## 2018-12-01 DIAGNOSIS — R160 Hepatomegaly, not elsewhere classified: Secondary | ICD-10-CM

## 2018-12-01 DIAGNOSIS — C778 Secondary and unspecified malignant neoplasm of lymph nodes of multiple regions: Secondary | ICD-10-CM

## 2018-12-01 DIAGNOSIS — C7951 Secondary malignant neoplasm of bone: Secondary | ICD-10-CM

## 2018-12-01 DIAGNOSIS — C61 Malignant neoplasm of prostate: Principal | ICD-10-CM

## 2018-12-01 NOTE — Progress Notes
Pt verbalizes being ready for discharge at this time. Dc instructions reviewed and pt verbalizes understanding. Pt is taken per w/c to exit, belongings in hand. Pt is AOx4, denies sob or pain needing intervention. Daughter at exit for transportation.

## 2018-12-03 ENCOUNTER — Encounter: Admit: 2018-12-03 | Discharge: 2018-12-03

## 2018-12-03 MED ORDER — EDOXABAN 60 MG PO TAB
60 mg | ORAL_TABLET | Freq: Every day | ORAL | 3 refills | Status: DC
Start: 2018-12-03 — End: 2019-01-03
  Filled 2018-12-22: qty 30, 30d supply, fill #1

## 2018-12-07 ENCOUNTER — Encounter: Admit: 2018-12-07 | Discharge: 2018-12-07

## 2018-12-07 DIAGNOSIS — E1142 Type 2 diabetes mellitus with diabetic polyneuropathy: Secondary | ICD-10-CM

## 2018-12-07 DIAGNOSIS — C787 Secondary malignant neoplasm of liver and intrahepatic bile duct: Secondary | ICD-10-CM

## 2018-12-07 DIAGNOSIS — C778 Secondary and unspecified malignant neoplasm of lymph nodes of multiple regions: Secondary | ICD-10-CM

## 2018-12-07 DIAGNOSIS — M1990 Unspecified osteoarthritis, unspecified site: Secondary | ICD-10-CM

## 2018-12-07 DIAGNOSIS — I2510 Atherosclerotic heart disease of native coronary artery without angina pectoris: Secondary | ICD-10-CM

## 2018-12-07 DIAGNOSIS — G4733 Obstructive sleep apnea (adult) (pediatric): Secondary | ICD-10-CM

## 2018-12-07 DIAGNOSIS — D6481 Anemia due to antineoplastic chemotherapy: Secondary | ICD-10-CM

## 2018-12-07 DIAGNOSIS — T451X5A Adverse effect of antineoplastic and immunosuppressive drugs, initial encounter: Secondary | ICD-10-CM

## 2018-12-07 DIAGNOSIS — C61 Malignant neoplasm of prostate: Principal | ICD-10-CM

## 2018-12-07 DIAGNOSIS — C7951 Secondary malignant neoplasm of bone: Secondary | ICD-10-CM

## 2018-12-07 DIAGNOSIS — Z87891 Personal history of nicotine dependence: Secondary | ICD-10-CM

## 2018-12-07 DIAGNOSIS — B0229 Other postherpetic nervous system involvement: Secondary | ICD-10-CM

## 2018-12-07 DIAGNOSIS — J9611 Chronic respiratory failure with hypoxia: Secondary | ICD-10-CM

## 2018-12-07 DIAGNOSIS — R0902 Hypoxemia: Secondary | ICD-10-CM

## 2018-12-07 DIAGNOSIS — A4902 Methicillin resistant Staphylococcus aureus infection, unspecified site: Secondary | ICD-10-CM

## 2018-12-07 DIAGNOSIS — E669 Obesity, unspecified: Secondary | ICD-10-CM

## 2018-12-07 DIAGNOSIS — G893 Neoplasm related pain (acute) (chronic): Secondary | ICD-10-CM

## 2018-12-07 DIAGNOSIS — I10 Essential (primary) hypertension: Secondary | ICD-10-CM

## 2018-12-07 DIAGNOSIS — D649 Anemia, unspecified: Secondary | ICD-10-CM

## 2018-12-07 DIAGNOSIS — E785 Hyperlipidemia, unspecified: Secondary | ICD-10-CM

## 2018-12-07 DIAGNOSIS — R0600 Dyspnea, unspecified: Secondary | ICD-10-CM

## 2018-12-07 DIAGNOSIS — I509 Heart failure, unspecified: Secondary | ICD-10-CM

## 2018-12-07 DIAGNOSIS — I214 Non-ST elevation (NSTEMI) myocardial infarction: Secondary | ICD-10-CM

## 2018-12-07 DIAGNOSIS — E039 Hypothyroidism, unspecified: Secondary | ICD-10-CM

## 2018-12-07 DIAGNOSIS — J449 Chronic obstructive pulmonary disease, unspecified: Secondary | ICD-10-CM

## 2018-12-07 DIAGNOSIS — E119 Type 2 diabetes mellitus without complications: Secondary | ICD-10-CM

## 2018-12-07 DIAGNOSIS — J45909 Unspecified asthma, uncomplicated: Secondary | ICD-10-CM

## 2018-12-07 DIAGNOSIS — N189 Chronic kidney disease, unspecified: Secondary | ICD-10-CM

## 2018-12-07 DIAGNOSIS — K219 Gastro-esophageal reflux disease without esophagitis: Secondary | ICD-10-CM

## 2018-12-07 LAB — COMPREHENSIVE METABOLIC PANEL
Lab: 0.6 mg/dL (ref 0.3–1.2)
Lab: 1.2 mg/dL — ABNORMAL HIGH (ref 0.4–1.24)
Lab: 10 U/L (ref 7–56)
Lab: 11 10*3/uL — ABNORMAL LOW (ref 3–12)
Lab: 135 MMOL/L — ABNORMAL LOW (ref 137–147)
Lab: 177 mg/dL — ABNORMAL HIGH (ref 70–100)
Lab: 183 U/L — ABNORMAL HIGH (ref 25–110)
Lab: 23 U/L — ABNORMAL HIGH (ref 7–40)
Lab: 27 MMOL/L (ref 21–30)
Lab: 27 mg/dL — ABNORMAL HIGH (ref 7–25)
Lab: 4 g/dL — ABNORMAL LOW (ref 3.5–5.0)
Lab: 4.1 MMOL/L — ABNORMAL LOW (ref 3.5–5.1)
Lab: 58 mL/min — ABNORMAL LOW (ref >60)
Lab: 7.3 g/dL (ref 6.0–8.0)
Lab: 9.4 mg/dL (ref 8.5–10.6)
Lab: >60 mL/min (ref >60)

## 2018-12-07 LAB — CBC AND DIFF
Lab: 0 10*3/uL (ref 0–0.20)
Lab: 3.5 M/UL — ABNORMAL LOW (ref 4.4–5.5)
Lab: 4.2 10*3/uL — ABNORMAL LOW (ref 4.5–11.0)

## 2018-12-07 LAB — PROSTATIC SPECIFIC ANTIGEN-PSA: Lab: 3.4 ng/mL — ABNORMAL LOW (ref <4.01)

## 2018-12-07 MED ORDER — LEUPROLIDE (6 MONTH) 45 MG IM SYKT
45 mg | Freq: Once | INTRAMUSCULAR | 0 refills | Status: CP
Start: 2018-12-07 — End: ?
  Administered 2018-12-07: 23:00:00 45 mg via INTRAMUSCULAR

## 2018-12-07 NOTE — Progress Notes
Patient received Lupron injection and tolerated without difficulty.  No pertinent changes since last assessment.

## 2018-12-07 NOTE — Progress Notes
Name: Alexander Holland          MRN: [id number]      DOB: [birth year]      AGE: 68 y.o.   DATE OF SERVICE: 12/07/2018    Subjective:           Reason for Visit: Metastatic prostate cancer  Follow Up    Cancer Staging  Prostate cancer [facility])  Staging form: Prostate, AJCC 7th Edition  - Clinical stage from 01/31/2016: Stage IV (T3b, N1, M1b, PSA: Less than 10, Gleason 8-10) - Signed by Ross Marcus, MD on 01/31/2016    Onc Timeline   Prostate cancer (HCC)   01/05/2016 Surgery    Transurethral resection of prostate: Pathology revealed Gleason 4+5=9 adenocarcinoma of the prostate     01/17/2016 Initial Diagnosis    Prostate cancer (HCC)     01/17/2016 Imaging    MRI pelvis: Extension of prostate tumor into mesorectal fascia and seminal vesicles, pelvic lymphadenopathy, +abnormal osseous lesions concerning for metastatic disease       01/26/2016 Imaging    Bone scan: Widespread osseous metastases in axial and appendicular skeleton     01/31/2016 - 03/04/2016 Chemotherapy    Bicalutamide 50 mg PO daily     02/19/2016 - 06/07/2016 Chemotherapy    Docetaxel 75 mg/m2 IV q3 weeks, plan for 6 cycles.  Administered without curative intent.     02/19/2016 -  Chemotherapy    Lupron 22.5 mg IM q3 months      06/09/2017 - 06/08/2018 Chemotherapy    Abiraterone 1000 mg PO daily + Prednisone 5 mg PO BID, given without curative intent (also participating in CHAARTED 2 trial)     06/09/2017 - 10/13/2017 Chemotherapy    Cabazitaxel 25 mg PO IV q21d, given without curative intent as investigational arm of CHAARTED 2 trial     05/21/2018 - 05/27/2018 Radiation    Palliative radiation to L medial iliac bone. Total dose 35 Gy in 5 fractions     06/08/2018 - 03/18/2019 Chemotherapy    OP SUPPORT PROSTATE ENZALUTAMIDE  Plan Provider: Risa Grill, MD  Treatment goal: Palliative      11/30/2018 Progression    Liver biopsy demonstrating progressive prostate adenocarcinoma        History of Present Illness Alexander Holland returns for clinical/lab follow up and biopsy review after a recent liver biopsy that demonstrated progressive prostate adenocarcinoma. Of note is that our first attempt at a liver biopsy was postponed due to hypoxia.     Today he reports that he has been feeling fairly well overall, better than he would expect. He does find that fatigue is worse and that his appetite is somewhat lower, which has resulted in him losing some weight.    He has noticed mild increase in L hip pain, but still rates it as mild currently. He is still needing Hydro/APAP at night only.    Denies any other new problems or concerning symptoms. Specifically, he denies any dyspnea, cough, nausea, vomiting, abdominal pain, diarrhea, constipation, or rash.            Review of Systems   HENT: Negative.    Eyes: Negative.    Respiratory: Positive for apnea (Chronic obstructive sleep apnea).    Cardiovascular: Negative.    Gastrointestinal: Negative.    Endocrine: Negative.    Musculoskeletal: Negative.    Skin: Negative.    Allergic/Immunologic: Negative.    Neurological: Negative.    Hematological: Negative.    Psychiatric/Behavioral: Negative.  Objective:      Past medical, surgical, family, and social history have been reviewed with the patient on 12/07/2018 and confirmed accuracy of the information outlined below:  Medical History:   Diagnosis Date   ??? Anemia    ??? Arthritis     DJD-Hip   ??? Asthma    ??? CAD (coronary artery disease)    ??? CHF (congestive heart failure) (HCC) 08/2015   ??? Chronic renal insufficiency    ??? COPD (chronic obstructive pulmonary disease) (HCC)    ??? DM (diabetes mellitus) (HCC)     120's,130's   ??? DOE (dyspnea on exertion)    ??? Ex-smoker    ??? GERD (gastroesophageal reflux disease)    ??? HLD (hyperlipidemia)    ??? HTN (hypertension)    ??? Hypothyroidism    ??? MRSA (methicillin resistant Staphylococcus aureus)    ??? NSTEMI (non-ST elevated myocardial infarction) (HCC)     1997, 2012   ??? Obesity ??? OSA on CPAP    ??? Postherpetic neuralgia    ??? Prostate cancer [facility])      Surgical History:   Procedure Laterality Date   ??? CORONARY STENT PLACEMENT  1997   ??? CARDIAC SURGERY  2012    CABG   ??? CYSTOSCOPY WITH TRANSURETHRAL PROSTATECTOMY N/A 01/05/2016    Performed by Mittie Bodo, MD at [facility] OR   ??? TUNNELED VENOUS PORT PLACEMENT Right 06/06/2017    right EJ port placement per IR   ??? ABDOMEN SURGERY      Umbilical Hernia   ??? HX HEART CATHETERIZATION     ??? LEFT HEART CATHETERIZATION      s/p PCI ~1995     Family History   Problem Relation Age of Onset   ??? Kidney Failure Sister    ??? Tuberculosis Paternal Grandmother    ??? Alcohol abuse Paternal Grandfather    ??? Cancer Other 68        type unknown     Social History     Socioeconomic History   ??? Marital status: Married     Spouse name: Not on file   ??? Number of children: Not on file   ??? Years of education: Not on file   ??? Highest education level: Not on file   Occupational History   ??? Not on file   Tobacco Use   ??? Smoking status: Former Smoker     Packs/day: 1.00     Years: 30.00     Pack years: 30.00     Types: Cigarettes     Last attempt to quit: 02/01/1996     Years since quitting: 22.8   ??? Smokeless tobacco: Never Used   Substance and Sexual Activity   ??? Alcohol use: Yes     Comment: rarely   ??? Drug use: No   ??? Sexual activity: Not on file   Other Topics Concern   ??? Not on file   Social History Narrative   ??? Not on file   Alexander Holland is married and lives with his wife. 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 They have 3 children (2 daughters, 1 son).        ??? ACCU-CHEK SOFTCLIX LANCETS MISC Use  as directed twice daily.     ??? albuterol (VENTOLIN HFA, PROAIR HFA) 90 mcg/actuation inhaler Inhale 2 Puffs by mouth every 6 hours as needed.     ??? albuterol 0.083% (PROVENTIL; VENTOLIN) 2.5  mg /3 mL (0.083 %) nebulizer solution Inhale 3 mL solution by nebulizer as directed every 4 hours as needed for Wheezing or Shortness of Breath. ??? aspirin EC 81 mg tablet Take 81 mg by mouth at bedtime daily. Take with food.   ??? atorvastatin (LIPITOR) 80 mg tablet Take 80 mg by mouth at bedtime daily.   ??? blood sugar diagnostic (ACCU-CHEK AVIVA PLUS) test strip 1 Strip by Test route before meals and at bedtime.     ??? carvedilol (COREG) 3.125 mg tablet Take one tablet by mouth twice daily with meals. Take with food.   ??? cholecalciferol (VITAMIN D-3) 1,000 units tablet Take 1,000 Units by mouth twice daily.   ??? edoxaban (SAVAYSA) 60 mg tab tablet Take one tablet by mouth daily.   ??? empagliflozin (JARDIANCE) 10 mg tablet Take one tablet by mouth daily.   ??? enzalutamide (XTANDI) 40 mg capsule Take four capsules by mouth daily. Take at the same time every day.   ??? EPINEPHrine (EPIPEN) 1 mg/mL injection pen (2-Pack) Inject 0.3 mg (1 Pen) into thigh if needed for anaphylactic reaction. May repeat in 5-15 minutes if needed.  Indications: a significant type of allergic reaction called anaphylaxis   ??? ferrous sulfate 325 mg (65 mg iron) tablet Take one tablet by mouth twice daily with meals.   ??? furosemide (LASIX) 40 mg tablet Take one tablet by mouth twice daily. Morning and afternoon   ??? gabapentin (NEURONTIN) 600 mg tablet Take one tablet by mouth every 8 hours.   ??? HYDROcodone/acetaminophen (NORCO) 10/325 mg tablet Take one tablet by mouth every 6 hours as needed for Pain   ??? insulin aspart U-100 (NOVOLOG FLEXPEN) 100 unit/mL (3 mL) injection PEN Inject twelve Units under the skin three times daily with meals.   ??? insulin glargine (LANTUS SOLOSTAR) 100 unit/mL (3 mL) injection PEN Inject sixty Units under the skin at bedtime daily.   ??? levothyroxine (SYNTHROID) 25 mcg tablet Take 25 mcg by mouth daily. Before breakfast    ??? lisinopril (PRINIVIL, ZESTRIL) 2.5 mg tablet Take 1 tablet by mouth daily.   ??? metFORMIN (GLUCOPHAGE) 1,000 mg tablet Take 1,000 mg by mouth twice daily with meals.     ??? morphine SR (MS CONTIN) 15 mg tablet Take one tablet by mouth at bedtime daily   ??? nitroglycerin (NITROSTAT) 0.4 mg tablet Place 0.4 mg under tongue every 5 minutes as needed.     ??? omeprazole DR(+) (PRILOSEC) 20 mg capsule Take 20 mg by mouth daily.     ??? ondansetron (ZOFRAN) 8 mg tablet Take one tablet by mouth every 8 hours as needed for Nausea or Vomiting. Indications: nausea and vomiting caused by cancer drugs   ??? oxyCODONE (ROXICODONE) 10 mg tablet Take 1-1.5 tablets by mouth every 3 hours as needed for Pain  Take   ??? senna (SENOKOT) 8.6 mg tablet Take 2 tablets by mouth at bedtime as needed for Constipation.   ??? sertraline (ZOLOFT) 50 mg tablet Take 1 tablet by mouth daily.   ??? triamterene-hydrochlorothiazide (DYAZIDE) 37.5-25 mg capsule Take 1 capsule by mouth every morning.     Vitals:    12/07/18 1623   BP: (!) 141/69   BP Source: Arm, Left Upper   Patient Position: Sitting   Pulse: 75   Resp: 16   SpO2: 99%   Weight: 94 kg (207 lb 3.2 oz)   Height: 170.2 cm (67.01)   PainSc: Three     Body mass index is  32.44 kg/m???.   Pain Score: Three   Pain Addressed:  Patient to call office if pain not relieved or worsened and Current regimen working to control pain.    Patient Evaluated for a Clinical Trial: No treatment clinical trial available for this patient.      [place] Cooperative Oncology Group performance status is 1, Restricted in physically strenuous activity but ambulatory and able to carry out work of a light or sedentary nature, e.g., light house work, office work.     Physical Exam    Constitutional: Well-developed, chronically ill-appearing gentleman sitting comfortably in exam room.  Eyes: EOMI, no conjunctival injection, anicteric sclerae, wearing supplementary oxygen via nasal cannula  ENT: Wearing mask over mouth and nose. No stridor.  CV: Normal rate, regular rhythm, no murmur, rub, or gallop. No pitting edema.  Respiratory: Normal work of breathing on room air. Good air movement throughout chest, no wheeze, rales, or rhonchi. GI: Abdomen soft and nondistended. Bowel sounds present in all 4 quadrants. No tenderness to palpation. No hepatomegaly, spleen tip not palpated.  GU: No catheter present  Msk: Normal muscle bulk and tone. No joint deformities or arthritic abnormalities.  Neuro: Cranial nerves grossly intact and symmetric. No focal neurologic deficits. Normal gait.  Integument/skin: No rash visualized nor any skin eruptions palpated.  Heme/lymph: No pathologic-appearing bruising. No cervical or supraclavicular lymphdenopathy.  Psych: Alert and oriented to person, place, date, and situation. Affect normal. Good insight and judgement.                 Data:  I have reviewed the patient's CBC/differential and compared to prior values. The full CBC is as follows:  CBC with Diff Latest Ref Rng & Units 12/07/2018 10/12/2018 08/31/2018 07/22/2018 06/08/2018   WBC 4.5 - 11.0 K/UL 4.2(L) 4.5 5.0 5.1 3.0(L)   RBC 4.4 - 5.5 M/UL 3.54(L) 3.16(L) 3.36(L) 3.12(L) 2.96(L)   HGB 13.5 - 16.5 GM/DL 1.6(X) 0.9(U) 0.4(V) 8.9(L) 8.2(L)   HCT 40 - 50 % 30.8(L) 27.4(L) 29.9(L) 28.2(L) 26.1(L)   MCV 80 - 100 FL 86.9 86.6 89.1 90.4 88.2   MCH 26 - 34 PG 25.7(L) 26.0 27.6 28.4 27.6   MCHC 32.0 - 36.0 G/DL 29.6(L) 30.1(L) 31.0(L) 31.5(L) 31.3(L)   RDW 11 - 15 % 19.0(H) 19.1(H) 17.1(H) 18.0(H) 20.2(H)   PLT 150 - 400 K/UL 202 166 186 193 163   MPV 7 - 11 FL 8.0 7.8 7.8 7.8 7.8   NEUT 41 - 77 % 67 55 66 70 73   ANC 1.8 - 7.0 K/UL 2.80 2.60 3.30 3.50 2.20   LYMA 24 - 44 % 15(L) 15(L) 16(L) 11(L) 14(L)   ALYM 1.0 - 4.8 K/UL 0.60(L) 0.70(L) 0.80(L) 0.60(L) 0.40(L)   MONA 4 - 12 % 11 10 8 10 9    AMONO 0 - 0.80 K/UL 0.50 0.40 0.40 0.50 0.30   EOSA 0 - 5 % 6(H) 18(H) 9(H) 8(H) 3   AEOS 0 - 0.45 K/UL 0.20 0.80(H) 0.50(H) 0.40 0.10   BASA 0 - 2 % 1 2 1 1 1    ABAS 0 - 0.20 K/UL 0.00 0.10 0.10 0.10 0.00       I have reviewed the patient's CMP and compared to prior values. The full CMP is as follows:  CMP Latest Ref Rng & Units 12/07/2018 10/12/2018 08/31/2018 07/22/2018 06/08/2018 NA 137 - 147 MMOL/L 135(L) 139 139 136(L) 140   K 3.5 - 5.1 MMOL/L 4.1 4.0 3.7 4.0 3.9   CL  98 - 110 MMOL/L 97(L) 101 98 99 100   CO2 21 - 30 MMOL/L 27 29 32(H) 25 31(H)   GAP 3 - 12 11 9 9 12 9    BUN 7 - 25 MG/DL 29(F) 20 17 15 13    CR 0.4 - 1.24 MG/DL 6.21(H) 0.86 5.78 4.69 0.89   GLUX 70 - 100 MG/DL 629(B) 284(X) 32(G) 401(U) 114(H)   CA 8.5 - 10.6 MG/DL 9.4 8.8 9.4 9.2 8.7   TP 6.0 - 8.0 G/DL 7.3 7.0 7.3 7.0 6.5   ALB 3.5 - 5.0 G/DL 4.0 3.7 3.6 2.7(O) 3.2(L)   ALKP 25 - 110 U/L 183(H) 166(H) 193(H) 268(H) 379(H)   ALT 7 - 56 U/L 10 8 12 14 15    TBILI 0.3 - 1.2 MG/DL 0.6 0.5 0.6 0.7 0.6   GFR >60 mL/min 58(L) >60 >60 >60 >60   GFRAA >60 mL/min >60 >60 >60 >60 >60     Lab Results   Component Value Date    PSA 3.47 12/07/2018    PSA 2.16 11/06/2018    PSA 1.39 10/12/2018    PSA 0.52 08/31/2018    PSA 0.24 07/22/2018    TESTOSTER <10 (L) 10/12/2018    TESTOSTER 12 (L) 08/31/2018    TESTOSTER <10 (L) 07/22/2018    TESTOSTER 22 (L) 06/08/2018    TESTOSTER 44 (L) 03/09/2018                  Assessment and Plan: Alexander Holland is a 68 y.o. gentleman with history of coronary artery disease who presents regarding evaluation and management of metastatic, castrate-resistant prostate cancer.    1. 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 Counseled him that this did not show transformation to small cell carcinoma. Counseled him that his biopsy was MSI-stable and that immunotherapy would therefore not be helpful.  ???I do recommend that we send NGS testing on his liver biopsy specimen to determine if he has any targetable mutations, but that I believe we're essentially at a tipping point with his treatment given his progression to visceral metastatic disease.  ???We discussed possible treatment trajectories to include chemotherapy (in this instance I would recommend Cabazitaxel + Carboplatin based on phase 1-2 data demonstrating higher overall response rate with this combination compared to Cabazitaxel alone) versus best supportive care versus looking for a therapeutic clinical trial elsewhere (we do not have any trials available at our institution, including early phase trials).   ???Discussed his goals of care and the advantages and disadvantages of all of these treatment approaches. He doesn't think it is feasible to travel for a trial, so he quickly ruled out that option. He would like to try chemotherapy and understands/accepts the risk of toxicity in pursuit of longevity.   ???Discussed the need for formalizing advance care planning as part of well-rounded cancer care.   ???Will order and schedule Cabazitaxel 20 mg/m2 + Carboplatin AUC 4 + pegfilgrastim per our discussions and RTC to start this as soon as possible.    2. Pain: LLE pain  ???With both mixed nociceptive and neuropathic/radicular components (neuropathic seems more prominent now).  ???Cont Gabapentin to 600 mg PO TID  ???Cont Hydro/APAP 10/325 mg PRN     3. Anemia:  ???Continue Ferrous sulfate 325 mg PO q12h, follow up colonoscopy results from GI  ???Continue B12 1000 mg IM monthly thereafter,  to be given indefinitely.     4. Neuropathy:  ???Grade 1, due to Cabazitaxel, prior Docetaxel, and diabetes.  ???No treatment indicated except for Gabapentin being used for radicular LLE pain.    5. Fatigue:  ???Due to cancer, continue energy conservation    6. Hypoxia:  ???Continue supplementary oxygen, will coordinate care with cardiology for workup of this, as the etiology is unclear.     Goals of care:  06/08/18: Stated goal is longevity.  He stated that he is willing to accept the risk of medical burden (new symptoms, hospitalizations) in pursuit of this goal.  12/07/18: His foremost goals is longevity and he is willing to risk toxicity in pursuit of that goal. Total face-to-face time for visit: 41 minutes, all (> 50%) of which was spent in education and counseling with patient, wife, and daughter in clinic. Discussed the patient's cancer diagnosis, management, progressive cancer, pain, fatigue, and reviewed goals.

## 2018-12-09 ENCOUNTER — Encounter: Admit: 2018-12-09 | Discharge: 2018-12-09

## 2018-12-09 NOTE — Progress Notes
Discontinued from Oral Oncology Patient Management Program    Alexander Holland has been removed from the Oral Oncology Patient Management Program due to progression.  Patient has discontinued taking enzalutamide.      The patient's provider, Dr. Dondra Spry, has noted the above information in their note.        The patient may be re-enrolled at any time by contacting [phone].      Cindy Hazy, [place]  Clinical Pharmacist  12/09/2018

## 2018-12-11 ENCOUNTER — Encounter: Admit: 2018-12-11 | Discharge: 2018-12-11

## 2018-12-11 ENCOUNTER — Ambulatory Visit: Admit: 2018-12-11 | Discharge: 2018-12-11

## 2018-12-11 DIAGNOSIS — E669 Obesity, unspecified: Secondary | ICD-10-CM

## 2018-12-11 DIAGNOSIS — I10 Essential (primary) hypertension: Secondary | ICD-10-CM

## 2018-12-11 DIAGNOSIS — E785 Hyperlipidemia, unspecified: Secondary | ICD-10-CM

## 2018-12-11 DIAGNOSIS — I214 Non-ST elevation (NSTEMI) myocardial infarction: Secondary | ICD-10-CM

## 2018-12-11 DIAGNOSIS — E039 Hypothyroidism, unspecified: Secondary | ICD-10-CM

## 2018-12-11 DIAGNOSIS — E119 Type 2 diabetes mellitus without complications: Secondary | ICD-10-CM

## 2018-12-11 DIAGNOSIS — I2510 Atherosclerotic heart disease of native coronary artery without angina pectoris: Secondary | ICD-10-CM

## 2018-12-11 DIAGNOSIS — B0229 Other postherpetic nervous system involvement: Secondary | ICD-10-CM

## 2018-12-11 DIAGNOSIS — I509 Heart failure, unspecified: Secondary | ICD-10-CM

## 2018-12-11 DIAGNOSIS — Z87891 Personal history of nicotine dependence: Secondary | ICD-10-CM

## 2018-12-11 DIAGNOSIS — K219 Gastro-esophageal reflux disease without esophagitis: Secondary | ICD-10-CM

## 2018-12-11 DIAGNOSIS — M1990 Unspecified osteoarthritis, unspecified site: Secondary | ICD-10-CM

## 2018-12-11 DIAGNOSIS — Z6834 Body mass index (BMI) 34.0-34.9, adult: Secondary | ICD-10-CM

## 2018-12-11 DIAGNOSIS — G4733 Obstructive sleep apnea (adult) (pediatric): Secondary | ICD-10-CM

## 2018-12-11 DIAGNOSIS — C61 Malignant neoplasm of prostate: Secondary | ICD-10-CM

## 2018-12-11 DIAGNOSIS — N189 Chronic kidney disease, unspecified: Secondary | ICD-10-CM

## 2018-12-11 DIAGNOSIS — R0600 Dyspnea, unspecified: Secondary | ICD-10-CM

## 2018-12-11 DIAGNOSIS — G893 Neoplasm related pain (acute) (chronic): Secondary | ICD-10-CM

## 2018-12-11 DIAGNOSIS — J449 Chronic obstructive pulmonary disease, unspecified: Secondary | ICD-10-CM

## 2018-12-11 DIAGNOSIS — E1165 Type 2 diabetes mellitus with hyperglycemia: Secondary | ICD-10-CM

## 2018-12-11 DIAGNOSIS — A4902 Methicillin resistant Staphylococcus aureus infection, unspecified site: Secondary | ICD-10-CM

## 2018-12-11 DIAGNOSIS — J45909 Unspecified asthma, uncomplicated: Secondary | ICD-10-CM

## 2018-12-11 DIAGNOSIS — Z794 Long term (current) use of insulin: Secondary | ICD-10-CM

## 2018-12-11 DIAGNOSIS — Z1159 Encounter for screening for other viral diseases: Secondary | ICD-10-CM

## 2018-12-11 DIAGNOSIS — D649 Anemia, unspecified: Secondary | ICD-10-CM

## 2018-12-11 MED ORDER — PALONOSETRON 0.25 MG/5 ML IV SOLN
.25 mg | Freq: Once | INTRAVENOUS | 0 refills | Status: CN
Start: 2018-12-11 — End: ?

## 2018-12-11 MED ORDER — APREPITANT 7.2 MG/ML IV EMUL
130 mg | Freq: Once | INTRAVENOUS | 0 refills | Status: CN
Start: 2018-12-11 — End: ?

## 2018-12-11 MED ORDER — DEXAMETHASONE IVPB
12 mg | Freq: Once | INTRAVENOUS | 0 refills | Status: CN
Start: 2018-12-11 — End: ?

## 2018-12-11 MED ORDER — DIPHENHYDRAMINE HCL 50 MG/ML IJ SOLN
25 mg | Freq: Once | INTRAVENOUS | 0 refills | Status: CN
Start: 2018-12-11 — End: ?

## 2018-12-11 MED ORDER — PROCHLORPERAZINE MALEATE 10 MG PO TAB
10 mg | ORAL_TABLET | ORAL | 1 refills | Status: AC | PRN
Start: 2018-12-11 — End: ?

## 2018-12-11 MED ORDER — PREDNISONE 10 MG PO TAB
10 mg | ORAL_TABLET | Freq: Every day | ORAL | 1 refills | Status: AC
Start: 2018-12-11 — End: ?

## 2018-12-11 MED ORDER — FAMOTIDINE (PF) 20 MG/2 ML IV SOLN
20 mg | Freq: Once | INTRAVENOUS | 0 refills | Status: CN
Start: 2018-12-11 — End: ?

## 2018-12-11 MED ORDER — MORPHINE 15 MG PO TBER
15 mg | ORAL_TABLET | Freq: Every evening | ORAL | 0 refills | 7.00000 days | Status: DC
Start: 2018-12-11 — End: 2019-01-07

## 2018-12-11 MED ORDER — CABAZITAXEL IVPB
20 mg/m2 | Freq: Once | INTRAVENOUS | 0 refills | Status: CN
Start: 2018-12-11 — End: ?

## 2018-12-11 MED ORDER — ONDANSETRON HCL 8 MG PO TAB
8 mg | ORAL_TABLET | ORAL | 1 refills | 8.00000 days | Status: AC | PRN
Start: 2018-12-11 — End: ?

## 2018-12-11 MED ORDER — HYDROCODONE-ACETAMINOPHEN 10-325 MG PO TAB
1 | ORAL_TABLET | ORAL | 0 refills | 15.00000 days | Status: DC | PRN
Start: 2018-12-11 — End: 2019-01-25

## 2018-12-11 NOTE — Progress Notes
Name: Alexander Holland          MRN: [id number]      DOB: 1950-09-17      AGE: 68 y.o.   DATE OF SERVICE: 12/11/2018    Subjective:             Reason for Visit:  Pt Ed      Alexander Holland is a 68 y.o. male.     Cancer Staging  Prostate cancer [facility])  Staging form: Prostate, AJCC 7th Edition  - Clinical stage from 01/31/2016: Stage IV (T3b, N1, M1b, PSA: Less than 10, Gleason 8-10) - Signed by Ross Marcus, MD on 01/31/2016    Onc Timeline   Prostate cancer (HCC)   01/05/2016 Surgery    Transurethral resection of prostate: Pathology revealed Gleason 4+5=9 adenocarcinoma of the prostate     01/17/2016 Initial Diagnosis    Prostate cancer (HCC)     01/17/2016 Imaging    MRI pelvis: Extension of prostate tumor into mesorectal fascia and seminal vesicles, pelvic lymphadenopathy, +abnormal osseous lesions concerning for metastatic disease       01/26/2016 Imaging    Bone scan: Widespread osseous metastases in axial and appendicular skeleton     01/31/2016 - 03/04/2016 Chemotherapy    Bicalutamide 50 mg PO daily     02/19/2016 - 06/07/2016 Chemotherapy    Docetaxel 75 mg/m2 IV q3 weeks, plan for 6 cycles.  Administered without curative intent.     02/19/2016 -  Chemotherapy    Lupron 22.5 mg IM q3 months      06/09/2017 - 06/08/2018 Chemotherapy    Abiraterone 1000 mg PO daily + Prednisone 5 mg PO BID, given without curative intent (also participating in CHAARTED 2 trial)     06/09/2017 - 10/13/2017 Chemotherapy    Cabazitaxel 25 mg PO IV q21d, given without curative intent as investigational arm of CHAARTED 2 trial     05/21/2018 - 05/27/2018 Radiation    Palliative radiation to L medial iliac bone. Total dose 35 Gy in 5 fractions     06/08/2018 - 03/18/2019 Chemotherapy    OP SUPPORT PROSTATE ENZALUTAMIDE  Plan Provider: Risa Grill, MD  Treatment goal: Palliative      11/30/2018 Progression    Liver biopsy demonstrating progressive prostate adenocarcinoma      12/21/2018 -  Chemotherapy OP GU CARBOPLATIN + CABAZITAXEL + PREDNISONE  Plan Provider: Risa Grill, MD  Treatment goal: Palliative          History of Present Illness  Alexander Holland is a very pleasant 68 year old gentleman who presents today for education prior to starting treatment with carboplatin and cabazitaxel for his prostate cancer.    The patient relates continued pain in both hips.  He states that he takes morphine before he goes to bed but will also use hydrocodone???acetaminophen as needed for any breakthrough pain.  He states that his pain is worse at night and so he mostly uses the hydrocodone in the evening as well.    The patient relates no other new symptomatic concerns.  He is accompanied by his daughter.  He does have several questions with regards to the planned chemotherapy       Review of Systems   Constitutional: Negative for activity change, appetite change, chills, fatigue, fever and unexpected weight change.   HENT: Negative for dental problem, hearing loss, mouth sores, nosebleeds, rhinorrhea, sinus pain, sneezing, sore throat, tinnitus, trouble swallowing and voice change.    Eyes: Negative for  visual disturbance.   Respiratory: Positive for apnea. Negative for cough, chest tightness, shortness of breath and wheezing.    Cardiovascular: Negative for chest pain, palpitations and leg swelling.   Gastrointestinal: Negative for abdominal pain, blood in stool, constipation, diarrhea, nausea and vomiting.   Genitourinary: Negative for decreased urine volume, difficulty urinating, dysuria, flank pain, frequency, hematuria and urgency.   Musculoskeletal: Negative for arthralgias, back pain, gait problem and myalgias.   Skin: Negative for rash and wound.   Allergic/Immunologic: Positive for environmental allergies and immunocompromised state.   Neurological: Negative for syncope, weakness, light-headedness and headaches.   Psychiatric/Behavioral: Negative for confusion, dysphoric mood and sleep disturbance. The patient is not nervous/anxious.        Objective:         ??? ACCU-CHEK SOFTCLIX LANCETS MISC Use  as directed twice daily.     ??? albuterol (VENTOLIN HFA, PROAIR HFA) 90 mcg/actuation inhaler Inhale 2 Puffs by mouth every 6 hours as needed.     ??? albuterol 0.083% (PROVENTIL; VENTOLIN) 2.5 mg /3 mL (0.083 %) nebulizer solution Inhale 3 mL solution by nebulizer as directed every 4 hours as needed for Wheezing or Shortness of Breath.   ??? aspirin EC 81 mg tablet Take 81 mg by mouth at bedtime daily. Take with food.   ??? atorvastatin (LIPITOR) 80 mg tablet Take 80 mg by mouth at bedtime daily.   ??? blood sugar diagnostic (ACCU-CHEK AVIVA PLUS) test strip 1 Strip by Test route before meals and at bedtime.     ??? carvedilol (COREG) 3.125 mg tablet Take one tablet by mouth twice daily with meals. Take with food.   ??? cholecalciferol (VITAMIN D-3) 1,000 units tablet Take 1,000 Units by mouth twice daily.   ??? edoxaban (SAVAYSA) 60 mg tab tablet Take one tablet by mouth daily.   ??? empagliflozin (JARDIANCE) 10 mg tablet Take one tablet by mouth daily.   ??? enzalutamide (XTANDI) 40 mg capsule Take four capsules by mouth daily. Take at the same time every day.   ??? EPINEPHrine (EPIPEN) 1 mg/mL injection pen (2-Pack) Inject 0.3 mg (1 Pen) into thigh if needed for anaphylactic reaction. May repeat in 5-15 minutes if needed.  Indications: a significant type of allergic reaction called anaphylaxis   ??? ferrous sulfate 325 mg (65 mg iron) tablet Take one tablet by mouth twice daily with meals.   ??? furosemide (LASIX) 40 mg tablet Take one tablet by mouth twice daily. Morning and afternoon   ??? gabapentin (NEURONTIN) 600 mg tablet Take one tablet by mouth every 8 hours.   ??? HYDROcodone/acetaminophen (NORCO) 10/325 mg tablet Take one tablet by mouth every 6 hours as needed for Pain   ??? insulin aspart U-100 (NOVOLOG FLEXPEN) 100 unit/mL (3 mL) injection PEN Inject twelve Units under the skin three times daily with meals. ??? insulin glargine (LANTUS SOLOSTAR) 100 unit/mL (3 mL) injection PEN Inject sixty Units under the skin at bedtime daily.   ??? levothyroxine (SYNTHROID) 25 mcg tablet Take 25 mcg by mouth daily. Before breakfast    ??? lisinopril (PRINIVIL, ZESTRIL) 2.5 mg tablet Take 1 tablet by mouth daily.   ??? metFORMIN (GLUCOPHAGE) 1,000 mg tablet Take 1,000 mg by mouth twice daily with meals.     ??? morphine SR (MS CONTIN) 15 mg tablet Take one tablet by mouth at bedtime daily   ??? nitroglycerin (NITROSTAT) 0.4 mg tablet Place 0.4 mg under tongue every 5 minutes as needed.     ??? omeprazole DR(+) (  PRILOSEC) 20 mg capsule Take 20 mg by mouth daily.     ??? ondansetron (ZOFRAN) 8 mg tablet Take one tablet by mouth every 8 hours as needed for Nausea or Vomiting. Indications: nausea and vomiting caused by cancer drugs   ??? senna (SENOKOT) 8.6 mg tablet Take 2 tablets by mouth at bedtime as needed for Constipation.   ??? sertraline (ZOLOFT) 50 mg tablet Take 1 tablet by mouth daily.   ??? triamterene-hydrochlorothiazide (DYAZIDE) 37.5-25 mg capsule Take 1 capsule by mouth every morning.     Vitals:    12/11/18 1411   BP: 102/55   BP Source: Arm, Left Upper   Patient Position: Sitting   Pulse: 80   Resp: 16   SpO2: 97%   Weight: 96.3 kg (212 lb 6.4 oz)   Height: 170.2 cm (67.01)   PainSc: Five     Body mass index is 33.26 kg/m???.     Pain Score: Five  Pain Loc: Hip    Fatigue Scale: 5    Pain Addressed:  Patient to call office if pain not relieved or worsened and Current regimen working to control pain.    Patient Evaluated for a Clinical Trial: Patient not eligible for a treatment trial (including not needing treatment, needs palliative care, in remission).     [place] Cooperative Oncology Group performance status is 2, Ambulatory and capable of all selfcare but unable to carry out any work activities. Up and about more than 50% of waking hours.     Physical Exam  Constitutional:       General: He is not in acute distress. Appearance: He is well-developed and normal weight. He is not toxic-appearing or diaphoretic.   HENT:      Head: Normocephalic and atraumatic.   Eyes:      General: No scleral icterus.     Extraocular Movements: Extraocular movements intact.   Neck:      Musculoskeletal: Normal range of motion.   Pulmonary:      Effort: Pulmonary effort is normal.      Comments: Wearing Oxygen  Musculoskeletal: Normal range of motion.   Skin:     General: Skin is warm and dry.      Findings: No rash.   Neurological:      General: No focal deficit present.      Mental Status: He is alert and oriented to person, place, and time. Mental status is at baseline.      Deep Tendon Reflexes: Reflexes are normal and symmetric.   Psychiatric:         Mood and Affect: Mood normal.         Behavior: Behavior normal.         Thought Content: Thought content normal.         Judgment: Judgment normal.          Lab Results   Component Value Date/Time    WBC 4.2 (L) 12/07/2018 04:03 PM    ANC 2.80 12/07/2018 04:03 PM    HGB 9.1 (L) 12/07/2018 04:03 PM    HCT 30.8 (L) 12/07/2018 04:03 PM    PLTCT 202 12/07/2018 04:03 PM     Lab Results   Component Value Date/Time    NA 135 (L) 12/07/2018 04:03 PM    K 4.1 12/07/2018 04:03 PM    BUN 27 (H) 12/07/2018 04:03 PM    CR 1.25 (H) 12/07/2018 04:03 PM    GLU 177 (H) 12/07/2018 04:03 PM  CA 9.4 12/07/2018 04:03 PM    AST 23 12/07/2018 04:03 PM    ALT 10 12/07/2018 04:03 PM    ALKPHOS 183 (H) 12/07/2018 04:03 PM          Assessment and Plan:    Alexander Holland is a 68 y.o. gentleman with history of coronary artery disease who presents regarding evaluation and management of metastatic, castrate-resistant prostate cancer.  ???  1. 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 Reviewed plan for additional chemotherapy with combination of carboplatin at an AUC of 4 and cabazitaxel 20 mg/m???.  -Discussed the logistics, rationale and possible side effects of chemotherapy. -Discussed that both chemotherapy drugs will be given on day 1 of an every 21-day cycle.  He can continue with the chemotherapy as long as he is responding and tolerating the treatment.  Due to the risk of neutropenia, he will receive pegfilgrastim support on day 2.  -Discussed that possible side effects of the chemotherapy include but are not limited to fatigue, nausea/vomiting, change in bowel habits, neurotoxicity, ototoxicity, electrolyte abnormalities, nephro toxicity, allergic reaction, alopecia as well as cytopenias resulting increased risk of infection and bleeding.  -Written handouts on the chemotherapy were provided.  -Our 24-hour contact information was provided and the importance of contacting us with any side effects was stressed.  -Per [facility] policy, he will undergo COVID-19 testing 48 hours prior to each cycle of chemotherapy.  -Informed consent was obtained from the patient with the plan to proceed with treatment next week as scheduled.  -He will return to clinic in 3 weeks with repeat labs at which time he will be due for cycle 2 of carboplatin and cabazitaxel.  ???  2. Pain: LLE pain  ???With both mixed nociceptive and neuropathic/radicular components (neuropathic seems more prominent now).  ???Cont Gabapentin to 600 mg PO TID  ???Cont Hydro/APAP 10/325 mg PRN; refill provided today  -Continue MsContin 15 mg QHS; refill provided today.  ???  3. Anemia:  ???Continue Ferrous sulfate 325 mg PO q12h, follow up colonoscopy results from GI  ???Continue B12 1000 mg IM monthly thereafter, to be given indefinitely.   ???  4. Neuropathy:  ???Grade 1, due to Cabazitaxel, prior Docetaxel, and diabetes.  ???No treatment indicated except for Gabapentin being used for radicular LLE pain.  ???  5. Fatigue:  ???Due to cancer, continue energy conservation  ???  6. Hypoxia:  ???Continue supplementary oxygen, will coordinate care with cardiology for workup of this, as the etiology is unclear.     Maryagnes Amos, PA-C Supervising physician:  Clent Jacks, MD
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
## 2018-12-13 ENCOUNTER — Encounter: Admit: 2018-12-13 | Discharge: 2018-12-13

## 2018-12-13 ENCOUNTER — Ambulatory Visit: Admit: 2018-12-13 | Discharge: 2018-12-14

## 2018-12-13 DIAGNOSIS — Z87891 Personal history of nicotine dependence: Secondary | ICD-10-CM

## 2018-12-13 DIAGNOSIS — M1990 Unspecified osteoarthritis, unspecified site: Secondary | ICD-10-CM

## 2018-12-13 DIAGNOSIS — E039 Hypothyroidism, unspecified: Secondary | ICD-10-CM

## 2018-12-13 DIAGNOSIS — J45909 Unspecified asthma, uncomplicated: Secondary | ICD-10-CM

## 2018-12-13 DIAGNOSIS — A4902 Methicillin resistant Staphylococcus aureus infection, unspecified site: Secondary | ICD-10-CM

## 2018-12-13 DIAGNOSIS — K219 Gastro-esophageal reflux disease without esophagitis: Secondary | ICD-10-CM

## 2018-12-13 DIAGNOSIS — J449 Chronic obstructive pulmonary disease, unspecified: Secondary | ICD-10-CM

## 2018-12-13 DIAGNOSIS — E785 Hyperlipidemia, unspecified: Secondary | ICD-10-CM

## 2018-12-13 DIAGNOSIS — N189 Chronic kidney disease, unspecified: Secondary | ICD-10-CM

## 2018-12-13 DIAGNOSIS — I2510 Atherosclerotic heart disease of native coronary artery without angina pectoris: Secondary | ICD-10-CM

## 2018-12-13 DIAGNOSIS — I509 Heart failure, unspecified: Secondary | ICD-10-CM

## 2018-12-13 DIAGNOSIS — Z1159 Encounter for screening for other viral diseases: Principal | ICD-10-CM

## 2018-12-13 DIAGNOSIS — B0229 Other postherpetic nervous system involvement: Secondary | ICD-10-CM

## 2018-12-13 DIAGNOSIS — D649 Anemia, unspecified: Secondary | ICD-10-CM

## 2018-12-13 DIAGNOSIS — R0600 Dyspnea, unspecified: Secondary | ICD-10-CM

## 2018-12-13 DIAGNOSIS — C61 Malignant neoplasm of prostate: Secondary | ICD-10-CM

## 2018-12-13 DIAGNOSIS — E119 Type 2 diabetes mellitus without complications: Secondary | ICD-10-CM

## 2018-12-13 DIAGNOSIS — I214 Non-ST elevation (NSTEMI) myocardial infarction: Secondary | ICD-10-CM

## 2018-12-13 DIAGNOSIS — I10 Essential (primary) hypertension: Secondary | ICD-10-CM

## 2018-12-13 DIAGNOSIS — G4733 Obstructive sleep apnea (adult) (pediatric): Secondary | ICD-10-CM

## 2018-12-13 DIAGNOSIS — E669 Obesity, unspecified: Secondary | ICD-10-CM

## 2018-12-13 NOTE — Progress Notes
Patient arrived to [facility] clinic for COVID-19 testing 12/13/18 1430. Patient identity confirmed via photo I.D. Nasopharyngeal procedure explained to the patient.   Nasopharyngeal swab completed right  Patient education provided given and instructed patient self isolate until contacted w/ results and further instructions. CDC handout on COVID-19 given to patient.   [email or web address]    Swab collected by Salley Scarlet.    Date symptoms began/reason for testing: [place] on 9/4

## 2018-12-14 LAB — COVID-19 (SARS-COV-2) PCR

## 2018-12-15 ENCOUNTER — Encounter: Admit: 2018-12-15 | Discharge: 2018-12-15

## 2018-12-16 ENCOUNTER — Encounter: Admit: 2018-12-16 | Discharge: 2018-12-16

## 2018-12-16 NOTE — Telephone Encounter
Left message for pt to call [facility]. I scheduled his next appts and tx.

## 2018-12-17 ENCOUNTER — Encounter: Admit: 2018-12-17 | Discharge: 2018-12-17

## 2018-12-17 DIAGNOSIS — C775 Secondary and unspecified malignant neoplasm of intrapelvic lymph nodes: Secondary | ICD-10-CM

## 2018-12-17 DIAGNOSIS — C787 Secondary malignant neoplasm of liver and intrahepatic bile duct: Secondary | ICD-10-CM

## 2018-12-17 DIAGNOSIS — Z5111 Encounter for antineoplastic chemotherapy: Secondary | ICD-10-CM

## 2018-12-17 DIAGNOSIS — C7951 Secondary malignant neoplasm of bone: Secondary | ICD-10-CM

## 2018-12-17 DIAGNOSIS — C61 Malignant neoplasm of prostate: Principal | ICD-10-CM

## 2018-12-17 MED ORDER — DEXAMETHASONE IVPB
12 mg | Freq: Once | INTRAVENOUS | 0 refills | Status: CP
Start: 2018-12-17 — End: ?
  Administered 2018-12-17 (×2): 12 mg via INTRAVENOUS

## 2018-12-17 MED ORDER — HEPARIN, PORCINE (PF) 100 UNIT/ML IV SYRG
500 [IU] | Freq: Once | 0 refills | Status: DC
Start: 2018-12-17 — End: 2018-12-22

## 2018-12-17 MED ORDER — APREPITANT 7.2 MG/ML IV EMUL
130 mg | Freq: Once | INTRAVENOUS | 0 refills | Status: CP
Start: 2018-12-17 — End: ?
  Administered 2018-12-17: 15:00:00 130 mg via INTRAVENOUS

## 2018-12-17 MED ORDER — PALONOSETRON 0.25 MG/5 ML IV SOLN
.25 mg | Freq: Once | INTRAVENOUS | 0 refills | Status: CP
Start: 2018-12-17 — End: ?
  Administered 2018-12-17: 15:00:00 0.25 mg via INTRAVENOUS

## 2018-12-17 MED ORDER — DIPHENHYDRAMINE HCL 50 MG/ML IJ SOLN
25 mg | Freq: Once | INTRAVENOUS | 0 refills | Status: CP
Start: 2018-12-17 — End: ?
  Administered 2018-12-17: 15:00:00 25 mg via INTRAVENOUS

## 2018-12-17 MED ORDER — CARBOPLATIN IVPB (BY AUC-SWOG)
280.5 mg | Freq: Once | INTRAVENOUS | 0 refills | Status: CP
Start: 2018-12-17 — End: ?
  Administered 2018-12-17 (×2): 280.5 mg via INTRAVENOUS

## 2018-12-17 MED ORDER — CABAZITAXEL IVPB
20 mg/m2 | Freq: Once | INTRAVENOUS | 0 refills | Status: CP
Start: 2018-12-17 — End: ?
  Administered 2018-12-17 (×2): 42.2 mg via INTRAVENOUS

## 2018-12-17 MED ORDER — FAMOTIDINE (PF) 20 MG/2 ML IV SOLN
20 mg | Freq: Once | INTRAVENOUS | 0 refills | Status: CP
Start: 2018-12-17 — End: ?
  Administered 2018-12-17: 15:00:00 20 mg via INTRAVENOUS

## 2018-12-18 ENCOUNTER — Encounter: Admit: 2018-12-18 | Discharge: 2018-12-18

## 2018-12-18 DIAGNOSIS — C775 Secondary and unspecified malignant neoplasm of intrapelvic lymph nodes: Secondary | ICD-10-CM

## 2018-12-18 DIAGNOSIS — C7951 Secondary malignant neoplasm of bone: Secondary | ICD-10-CM

## 2018-12-18 DIAGNOSIS — C787 Secondary malignant neoplasm of liver and intrahepatic bile duct: Secondary | ICD-10-CM

## 2018-12-18 DIAGNOSIS — C61 Malignant neoplasm of prostate: Secondary | ICD-10-CM

## 2018-12-18 DIAGNOSIS — Z5111 Encounter for antineoplastic chemotherapy: Secondary | ICD-10-CM

## 2018-12-18 MED ORDER — PEGFILGRASTIM-CBQV 6 MG/0.6 ML SC SYRG
6 mg | Freq: Once | SUBCUTANEOUS | 0 refills | Status: CP
Start: 2018-12-18 — End: ?
  Administered 2018-12-18: 19:00:00 6 mg via SUBCUTANEOUS

## 2018-12-18 NOTE — Patient Instructions
For up to date information on the COVID-19 virus, visit the CDC website. https://www.cdc.gov/coronavirus   General supportive care during cold and flu season and infection prevention reminders:    o Wash hands often with soap and water for at least 20 seconds   o Cover your mouth and nose   o Social distancing: try to maintain 6 feet between you and other people   o Stay home if sick and symptoms mild or manageable?   If you must be around people wear a mask     If you are having symptoms of a lower respiratory infection (cough, shortness of breath) and/or fever AND either traveled in last 30 days (internationally or to region of exposure) OR known exposure to patient with COVID19:     o Call your primary care provider for questions or health needs.    Tell your doctor about your recent travel and your symptoms     o In a medical emergency, call 911 or go to the nearest emergency room.        [facility]  Chemotherapy Instructions      Chemotherapy Drugs:        Scheduled Medications:      Other:     As Needed Medications:      Other:     Call Immediately to report the following:  Uncontrolled nausea and/or vomiting, uncontrolled pain, or unusual bleeding.  Temperature of 100.5 F or greater and/or any sign/symptom of infection (redness, warmth, tenderness)  Painful mouth or difficulty swallowing  Red, cracked, or painful hands and/or feet  Diarrhea   Swelling of arms or legs  Rash  Other:    Important Phone Numbers:  Cancer Center Main Number (answered 24 hours a day) [phone]  Cancer Center Scheduling (appointments)[phone]  Social Worker Lynn Vanderweel [phone]  Nutritionist  [phone]  [facility] (for nutritional supplements) [phone]

## 2018-12-18 NOTE — Progress Notes
FMLA papers for daughter, Ainsley Spinner. completed and  mailed to her per request.

## 2018-12-18 NOTE — Progress Notes
patient here for injection of Udenyca

## 2018-12-22 ENCOUNTER — Encounter: Admit: 2018-12-22 | Discharge: 2018-12-22

## 2018-12-31 ENCOUNTER — Inpatient Hospital Stay: Admit: 2018-12-31 | Discharge: 2019-01-03 | Disposition: A | Discharge status: Home / Self Care | Payer: MEDICARE

## 2018-12-31 ENCOUNTER — Encounter: Admit: 2018-12-31 | Discharge: 2018-12-31 | Payer: MEDICARE

## 2018-12-31 ENCOUNTER — Emergency Department: Admit: 2018-12-31 | Discharge: 2018-12-31 | Payer: MEDICARE

## 2018-12-31 ENCOUNTER — Encounter: Admit: 2018-12-31 | Discharge: 2019-01-01 | Payer: MEDICARE

## 2018-12-31 LAB — URINALYSIS DIPSTICK REFLEX TO CULTURE
Lab: NEGATIVE (ref 3–12)
Lab: NEGATIVE U/L (ref 7–40)
Lab: NEGATIVE mL/min — ABNORMAL LOW
Lab: NEGATIVE — ABNORMAL HIGH

## 2018-12-31 LAB — URINALYSIS MICROSCOPIC REFLEX TO CULTURE

## 2018-12-31 LAB — HEMOGLOBIN A1C: Lab: 6.3 % — ABNORMAL HIGH (ref 4.0–6.0)

## 2018-12-31 LAB — D-DIMER: Lab: 764 ng{FEU}/mL — ABNORMAL HIGH (ref <500)

## 2018-12-31 LAB — PHOSPHORUS: Lab: 2.9 mg/dL (ref 2.0–4.5)

## 2018-12-31 LAB — BNP (B-TYPE NATRIURETIC PEPTI): Lab: 297 pg/mL — ABNORMAL HIGH (ref 0–100)

## 2018-12-31 LAB — COMPREHENSIVE METABOLIC PANEL: Lab: 136 MMOL/L — ABNORMAL LOW (ref 137–147)

## 2018-12-31 LAB — BILIRUBIN, DIRECT: Lab: 0.4 mg/dL — ABNORMAL HIGH (ref <0.4)

## 2018-12-31 LAB — PROTIME INR (PT): Lab: 1.1 g/dL — ABNORMAL LOW (ref 0.8–1.2)

## 2018-12-31 LAB — COVID-19 (SARS-COV-2) PCR

## 2018-12-31 LAB — LIPASE: Lab: 27 U/L (ref 11–82)

## 2018-12-31 LAB — CBC AND DIFF: Lab: 3.5 10*3/uL — ABNORMAL LOW (ref 4.5–11.0)

## 2018-12-31 LAB — PTT (APTT): Lab: 30 s — ABNORMAL LOW (ref 24.0–36.5)

## 2018-12-31 LAB — BNP POC ER: Lab: 448 pg/mL — ABNORMAL HIGH (ref 0–100)

## 2018-12-31 LAB — CORTISOL,RANDOM: Lab: 20 ug/dL — ABNORMAL HIGH (ref 5.0–20.0)

## 2018-12-31 LAB — MAGNESIUM: Lab: 1.8 mg/dL — ABNORMAL HIGH (ref 1.6–2.6)

## 2018-12-31 LAB — POC GLUCOSE: Lab: 179 mg/dL — ABNORMAL HIGH (ref 70–100)

## 2018-12-31 LAB — POC TROPONIN: Lab: 0 ng/mL (ref 0.00–0.05)

## 2018-12-31 MED ORDER — PEG-ELECTROLYTE SOLN 420 GRAM PO SOLR
4 L | ORAL | 0 refills | Status: DC
Start: 2018-12-31 — End: 2019-01-02
  Administered 2018-12-31: 4 L via ORAL

## 2018-12-31 MED ORDER — INSULIN GLARGINE 100 UNIT/ML (3 ML) SC INJ PEN
40 [IU] | Freq: Every evening | SUBCUTANEOUS | 0 refills | Status: DC
Start: 2018-12-31 — End: 2019-01-03
  Administered 2019-01-01: 04:00:00 40 [IU] via SUBCUTANEOUS

## 2018-12-31 MED ORDER — MORPHINE 15 MG PO TBER
15 mg | Freq: Every evening | ORAL | 0 refills | Status: DC
Start: 2018-12-31 — End: 2019-01-03
  Administered 2019-01-01 – 2019-01-03 (×3): 15 mg via ORAL

## 2018-12-31 MED ORDER — ONDANSETRON HCL 4 MG PO TAB
4 mg | ORAL | 0 refills | Status: DC | PRN
Start: 2018-12-31 — End: 2019-01-03

## 2018-12-31 MED ORDER — SODIUM CHLORIDE 0.9 % IV SOLP
500 mL | INTRAVENOUS | 0 refills | Status: CP
Start: 2018-12-31 — End: ?
  Administered 2018-12-31: 18:00:00 500 mL via INTRAVENOUS

## 2018-12-31 MED ORDER — POLYETHYLENE GLYCOL 3350 17 GRAM PO PWPK
1 | Freq: Every day | ORAL | 0 refills | Status: DC | PRN
Start: 2018-12-31 — End: 2019-01-03

## 2018-12-31 MED ORDER — PEG-ELECTROLYTE SOLN 420 GRAM PO SOLR
2 L | ORAL | 0 refills | Status: DC | PRN
Start: 2018-12-31 — End: 2019-01-02
  Administered 2019-01-01: 12:00:00 2 L via ORAL

## 2018-12-31 MED ORDER — INSULIN ASPART 100 UNIT/ML SC FLEXPEN
12 [IU] | Freq: Three times a day (TID) | SUBCUTANEOUS | 0 refills | Status: DC
Start: 2018-12-31 — End: 2019-01-03
  Administered 2019-01-01: 23:00:00 12 [IU] via SUBCUTANEOUS

## 2018-12-31 MED ORDER — PREDNISONE 10 MG PO TAB
10 mg | Freq: Every day | ORAL | 0 refills | Status: DC
Start: 2018-12-31 — End: 2019-01-03
  Administered 2019-01-01 – 2019-01-03 (×3): 10 mg via ORAL

## 2018-12-31 MED ORDER — SERTRALINE 50 MG PO TAB
50 mg | Freq: Every day | ORAL | 0 refills | Status: DC
Start: 2018-12-31 — End: 2019-01-03
  Administered 2019-01-01 – 2019-01-03 (×3): 50 mg via ORAL

## 2018-12-31 MED ORDER — CARVEDILOL 3.125 MG PO TAB
3.125 mg | Freq: Two times a day (BID) | ORAL | 0 refills | Status: DC
Start: 2018-12-31 — End: 2019-01-03
  Administered 2019-01-01 – 2019-01-03 (×5): 3.125 mg via ORAL

## 2018-12-31 MED ORDER — ACETAMINOPHEN 325 MG PO TAB
650 mg | ORAL | 0 refills | Status: DC | PRN
Start: 2018-12-31 — End: 2019-01-03

## 2018-12-31 MED ORDER — HYDROCODONE-ACETAMINOPHEN 10-325 MG PO TAB
1 | ORAL | 0 refills | Status: DC | PRN
Start: 2018-12-31 — End: 2019-01-03

## 2018-12-31 MED ORDER — IMS MIXTURE TEMPLATE
800 mg | Freq: Three times a day (TID) | ORAL | 0 refills | Status: DC
Start: 2018-12-31 — End: 2019-01-03
  Administered 2019-01-01 – 2019-01-03 (×16): 800 mg via ORAL

## 2018-12-31 MED ORDER — ATORVASTATIN 40 MG PO TAB
80 mg | Freq: Every evening | ORAL | 0 refills | Status: DC
Start: 2018-12-31 — End: 2019-01-03
  Administered 2019-01-01 – 2019-01-03 (×3): 80 mg via ORAL

## 2018-12-31 MED ORDER — PANTOPRAZOLE 40 MG IV SOLR
40 mg | Freq: Two times a day (BID) | INTRAVENOUS | 0 refills | Status: DC
Start: 2018-12-31 — End: 2019-01-03
  Administered 2018-12-31 – 2019-01-03 (×6): 40 mg via INTRAVENOUS

## 2018-12-31 MED ORDER — MELATONIN 3 MG PO TAB
3 mg | Freq: Every evening | ORAL | 0 refills | Status: DC | PRN
Start: 2018-12-31 — End: 2019-01-03

## 2018-12-31 MED ORDER — LEVOTHYROXINE 25 MCG PO TAB
25 ug | Freq: Every day | ORAL | 0 refills | Status: DC
Start: 2018-12-31 — End: 2019-01-03
  Administered 2019-01-01 – 2019-01-03 (×3): 25 ug via ORAL

## 2018-12-31 MED ORDER — BISACODYL 5 MG PO TBEC
10 mg | Freq: Once | ORAL | 0 refills | Status: AC | PRN
Start: 2018-12-31 — End: ?

## 2018-12-31 MED ORDER — ONDANSETRON HCL (PF) 4 MG/2 ML IJ SOLN
4 mg | INTRAVENOUS | 0 refills | Status: DC | PRN
Start: 2018-12-31 — End: 2019-01-03

## 2018-12-31 MED ORDER — BISACODYL 10 MG RE SUPP
10 mg | Freq: Every day | RECTAL | 0 refills | Status: DC | PRN
Start: 2018-12-31 — End: 2019-01-02

## 2018-12-31 NOTE — Consults
Gastroenterology Consult Note  Patient Name:Alexander Holland         ZOX:[id number]  Admission Date: 12/31/2018 11:39 AM      Principal Problem:    GI bleeding      History of Present Illness/Subjective:  Alexander Holland is a 68 y.o. 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 Gi consulted for bright red blood per rectum and anemia.    Patient received chemotherapy 1-1.5 weeks ago and over the last few days has been feeling more fatigued. His daughter notes he has been sleeping more and becomes winded with less exertion than normal. They also report frequent loose stools over the last several days, which they describe as black dark. His wife also reported seeing bright red blood. He denies any abdominal pain or rectal pain. No n/v, no reflux, no dysphagia, no NSAID use.     In the ER, hemoglobin found to be 6.2, down from 8-9 baseline. He denies any chest or SOB. He denies orthopnea or LE swelling. He has not taken any oral meds in 1 week.    He reports having colonoscopy years ago, does not remember where.    Assessment/ Plan:    #Acute on chronic anemia, pancytopenia  #Dark stools and bright red blood per rectum  #Metastatic prostate cancer with pelvis radiation  #CHF, CAD, CABG  #History of DVT on anticoagulation  #Diarrhea?    Alexander Holland presents with concern for GI bleed with reported black stools and BRBPR with two point drop in hemoglobin. Also cell counts are low likely from recent chemotherapy, but he is not neutropenic. Pelvic radiation history, so might have radiation proctitis bleeding vs AVM. Dark stools could be from upper GI AVM vs PUD (on prednisone) vs gastritis. We plan to proceed with EGD and colonoscopy for further evaluation. He has slightly elevated BNP, but denies any SOB, chest pain, and is not hypoxic.    Diarrhea mentioned as well, could be from recent chemo, but would also check infectious work up.      Recommendations:  - clear liquid diet today and make NPO at midnight  - bowel prep ordered by GI to start tonight  - plan for EGD/colonoscopy on 9/18  - continue to hold all anticoagulation  - trend CBC and transfuse for goal > 8  - IV PPI BID  - order C. Diff PCR, stool culture, O&P    Patient seen/discussed with Dr. Pat Patrick  GI fellow   Pager [phone]  12/31/2018 3:33 PM       -----------------------------  PMH:  Medical History:   Diagnosis Date   ? Anemia    ? Arthritis     DJD-Hip   ? Asthma    ? CAD (coronary artery disease)    ? CHF (congestive heart failure) (HCC) 08/2015   ? Chronic renal insufficiency    ? COPD (chronic obstructive pulmonary disease) (HCC)    ? DM (diabetes mellitus) (HCC)     120's,130's   ? DOE (dyspnea on exertion)    ? Ex-smoker    ? GERD (gastroesophageal reflux disease)    ? HLD (hyperlipidemia)    ? HTN (hypertension)    ? Hypothyroidism    ? MRSA (methicillin resistant Staphylococcus aureus)    ?  NSTEMI (non-ST elevated myocardial infarction) (HCC)     1997, 2012   ? Obesity    ? OSA on CPAP    ? Postherpetic neuralgia    ? Prostate cancer [facility])        Current medications:  No current facility-administered medications on file prior to encounter.      Current Outpatient Medications on File Prior to Encounter   Medication Sig Dispense Refill   ? ACCU-CHEK SOFTCLIX LANCETS MISC Use  as directed twice daily.       ? albuterol (VENTOLIN HFA, PROAIR HFA) 90 mcg/actuation inhaler Inhale 2 Puffs by mouth every 6 hours as needed.       ? albuterol 0.083% (PROVENTIL; VENTOLIN) 2.5 mg /3 mL (0.083 %) nebulizer solution Inhale 3 mL solution by nebulizer as directed every 4 hours as needed for Wheezing or Shortness of Breath. 180 mL 3 ? aspirin EC 81 mg tablet Take 81 mg by mouth at bedtime daily. Take with food.     ? atorvastatin (LIPITOR) 80 mg tablet Take 80 mg by mouth at bedtime daily.     ? blood sugar diagnostic (ACCU-CHEK AVIVA PLUS) test strip 1 Strip by Test route before meals and at bedtime.       ? carvedilol (COREG) 3.125 mg tablet Take one tablet by mouth twice daily with meals. Take with food. 60 tablet 11   ? cholecalciferol (VITAMIN D-3) 1,000 units tablet Take 1,000 Units by mouth twice daily.     ? edoxaban (SAVAYSA) 60 mg tab tablet Take one tablet by mouth daily. 30 tablet 3   ? empagliflozin (JARDIANCE) 10 mg tablet Take one tablet by mouth daily. 90 tablet 3   ? enzalutamide (XTANDI) 40 mg capsule Take four capsules by mouth daily. Take at the same time every day. 120 capsule 3   ? EPINEPHrine (EPIPEN) 1 mg/mL injection pen (2-Pack) Inject 0.3 mg (1 Pen) into thigh if needed for anaphylactic reaction. May repeat in 5-15 minutes if needed.  Indications: a significant type of allergic reaction called anaphylaxis 2 each 1   ? ferrous sulfate 325 mg (65 mg iron) tablet Take one tablet by mouth twice daily with meals.     ? furosemide (LASIX) 40 mg tablet Take one tablet by mouth twice daily. Morning and afternoon 180 tablet 3   ? gabapentin (NEURONTIN) 600 mg tablet Take one tablet by mouth every 8 hours. 90 tablet 3   ? HYDROcodone/acetaminophen (NORCO) 10/325 mg tablet Take one tablet by mouth every 6 hours as needed for Pain 120 tablet 0   ? insulin aspart U-100 (NOVOLOG FLEXPEN) 100 unit/mL (3 mL) injection PEN Inject twelve Units under the skin three times daily with meals.     ? insulin glargine (LANTUS SOLOSTAR) 100 unit/mL (3 mL) injection PEN Inject sixty Units under the skin at bedtime daily.     ? levothyroxine (SYNTHROID) 25 mcg tablet Take 25 mcg by mouth daily. Before breakfast      ? lisinopril (PRINIVIL, ZESTRIL) 2.5 mg tablet Take 1 tablet by mouth daily. 90 tablet 3 ? metFORMIN (GLUCOPHAGE) 1,000 mg tablet Take 1,000 mg by mouth twice daily with meals.       ? morphine SR (MS CONTIN) 15 mg tablet Take one tablet by mouth at bedtime daily 30 tablet 0   ? nitroglycerin (NITROSTAT) 0.4 mg tablet Place 0.4 mg under tongue every 5 minutes as needed.       ? omeprazole DR(+) (PRILOSEC) 20 mg  capsule Take 20 mg by mouth daily.       ? ondansetron (ZOFRAN) 8 mg tablet Take one tablet by mouth every 8 hours as needed (nausea and vomiting). 30 tablet 1   ? ondansetron (ZOFRAN) 8 mg tablet Take one tablet by mouth every 8 hours as needed for Nausea or Vomiting. Indications: nausea and vomiting caused by cancer drugs 90 tablet 3   ? predniSONE (DELTASONE) 10 mg tablet Take one tablet by mouth daily with breakfast. 90 tablet 1   ? prochlorperazine maleate (COMPAZINE) 10 mg tablet Take one tablet by mouth every 6 hours as needed for Nausea or Vomiting. 30 tablet 1   ? senna (SENOKOT) 8.6 mg tablet Take 2 tablets by mouth at bedtime as needed for Constipation.     ? sertraline (ZOLOFT) 50 mg tablet Take 1 tablet by mouth daily. 30 tablet 3   ? triamterene-hydrochlorothiazide (DYAZIDE) 37.5-25 mg capsule Take 1 capsule by mouth every morning.         PSH:  Surgical History:   Procedure Laterality Date   ? CORONARY STENT PLACEMENT  1997   ? CARDIAC SURGERY  2012    CABG   ? CYSTOSCOPY WITH TRANSURETHRAL PROSTATECTOMY N/A 01/05/2016    Performed by Mittie Bodo, MD at [facility] OR   ? TUNNELED VENOUS PORT PLACEMENT Right 06/06/2017    right EJ port placement per IR   ? ABDOMEN SURGERY      Umbilical Hernia   ? HX HEART CATHETERIZATION     ? LEFT HEART CATHETERIZATION      s/p PCI ~1995       SH:  Social History     Socioeconomic History   ? Marital status: Married     Spouse name: Not on file   ? Number of children: Not on file   ? Years of education: Not on file   ? Highest education level: Not on file   Occupational History   ? Not on file   Tobacco Use   ? Smoking status: Former Smoker Packs/day: 1.00     Years: 30.00     Pack years: 30.00     Types: Cigarettes     Last attempt to quit: 02/01/1996     Years since quitting: 22.9   ? Smokeless tobacco: Never Used   Substance and Sexual Activity   ? Alcohol use: Yes     Comment: rarely   ? Drug use: No   ? Sexual activity: Not on file   Other Topics Concern   ? Not on file   Social History Narrative   ? Not on file       FH:  Family History   Problem Relation Age of Onset   ? Kidney Failure Sister    ? Tuberculosis Paternal Grandmother    ? Alcohol abuse Paternal Grandfather    ? Cancer Other 55        type unknown       Review of Systems:  Constitutional: No fevers, chills, weight loss  Eyes: No vision deficit, no icterus  Ears, nose, mouth: No oral bleeding, ulcer  Cardiovascular: No chest pain, palpitations  Respiratory: No dyspnea, cough   Gastrointestinal: No abdominal pain, blood in stool  Musculoskeltal: No joint inflammation, new deformity  Integumentary: No rashes, exudate  Neurologic: No cognitive change, focal weakness  Hematologic: No for easy bleeding or bruising  Please see HPI for additional pertinent documentation    Physical Exam:  Vitals:  12/31/18 1200 12/31/18 1230 12/31/18 1430 12/31/18 1515   BP: (!) 141/78 137/70  (!) 144/67   BP Source:       Pulse:  81 77 76   Temp:    37.3 ?C (99.2 ?F)   SpO2: 100% 100% 100% 100%     Constitutional- Vitals above, no acute distress.   Head - Normocephalic, atraumatic.   Eyes -  EOMI grossly. No icterus or injection.   Ears, nose, mouth, throat- No oral ulcer or bleeding.   Neck - No swelling or tracheal deviation.   Respiratory - Symmetric chest rise, no increased work of breathing.  Cardiovascular - Peripheral pulses intact, no pedal edema.  Gastrointestinal- Non-TTP. No hepatospenomegaly.   Rectal - declined  Skin - No exposed rash, lesion.  Neurologic - No CN deficit, normal fluid speech  Psychiatric - Judgement intact, thought content appropriate.    Labs/Imaging: Pertient labs/imaging were reviewed on initiation of progress note.

## 2018-12-31 NOTE — Progress Notes
Daughter called stating pt has not been out of bed since yesterday and has had diarrhea uncontrolled with Immodium for days. T 100.8 BP 102/48 and O2 sat on CPAP 94-98%. Pt not eating and drinking minimal fluids. Jennifer Heins PA-C updated and instructed to call ambulance and take pt to ER. Daughter verbalizes understanding.

## 2018-12-31 NOTE — ED Provider Notes
Alexander Holland is a 68 y.o. male.    Chief Complaint:  Chief Complaint   Patient presents with   ? Weakness     hx of prostate and liver CA. more weak than usual.        History of Present Illness:  Alexander Holland is a 68 y.o. male, with a history of asthma, CAD, CHF, COPD, DM, HLD, HTN, hypothyroidism, NSTEMI, and prostate and liver cancer who presents to the emergency department for diarrhea and weakness. Patient reports x1 week history of persistent non-bloody, non-painful diarrhea and decreased appetite. Patient states he has developed associated weakness and fatigue for the past x3 days, and notes the last time he ate was x3 days ago. Patient reports he stopped taking his medications x3 days ago because I just don't feel like it - states one of these was prednisone. Patient admits that he has been feeling increased dysphoria for the past week, but denies any SI at this time. Patient states he lives at home with his wife in [place] and his daughter recommended he come be evaluated today. Patient reports fever was recorded via EMS on his way here, but he otherwise denies anosmia, dizziness, light-headedness, myalgias, chest pain, SOA, sore throat, or urinary changes. Patient denies hx of similar symptoms. Patient reports he is on 3L NC at baseline which is unchanged at this time. Denies recent abx use.      History provided by:  Patient and medical records  Language interpreter used: No        Review of Systems:  Review of Systems   Constitutional: Positive for appetite change and fatigue. Negative for chills and fever.   HENT: Negative for congestion, rhinorrhea and sore throat.    Eyes: Negative for visual disturbance.   Respiratory: Negative for cough and shortness of breath.    Cardiovascular: Negative for chest pain.   Gastrointestinal: Positive for diarrhea. Negative for abdominal pain, nausea and vomiting.   Genitourinary: Negative for dysuria and hematuria. Musculoskeletal: Negative.  Negative for myalgias.   Skin: Negative for rash.   Neurological: Positive for weakness. Negative for dizziness, syncope and light-headedness.   Psychiatric/Behavioral: Positive for dysphoric mood. Negative for suicidal ideas.       Allergies:  Gadolinium-containing contrast media and Multihance [gadobenate dimeglumine]    Past Medical History:  Medical History:   Diagnosis Date   ? Anemia    ? Arthritis     DJD-Hip   ? Asthma    ? CAD (coronary artery disease)    ? CHF (congestive heart failure) (HCC) 08/2015   ? Chronic renal insufficiency    ? COPD (chronic obstructive pulmonary disease) (HCC)    ? DM (diabetes mellitus) (HCC)     120's,130's   ? DOE (dyspnea on exertion)    ? Ex-smoker    ? GERD (gastroesophageal reflux disease)    ? HLD (hyperlipidemia)    ? HTN (hypertension)    ? Hypothyroidism    ? MRSA (methicillin resistant Staphylococcus aureus)    ? NSTEMI (non-ST elevated myocardial infarction) (HCC)     1997, 2012   ? Obesity    ? OSA on CPAP    ? Postherpetic neuralgia    ? Prostate cancer [facility])        Past Surgical History:  Surgical History:   Procedure Laterality Date   ? CORONARY STENT PLACEMENT  1997   ? CARDIAC SURGERY  2012    CABG   ? CYSTOSCOPY  WITH TRANSURETHRAL PROSTATECTOMY N/A 01/05/2016    Performed by Mittie Bodo[facility] at [facility] OR   ? TUNNELED VENOUS PORT PLACEMENT Right 06/06/2017    right EJ port placement per IR   ? ABDOMEN SURGERY      Umbilical Hernia   ? HX HEART CATHETERIZATION     ? LEFT HEART CATHETERIZATION      s/p PCI ~1995       Pertinent medical/surgical history reviewed  Medical History:   Diagnosis Date   ? Anemia    ? Arthritis     DJD-Hip   ? Asthma    ? CAD (coronary artery disease)    ? CHF (congestive heart failure) (HCC) 08/2015   ? Chronic renal insufficiency    ? COPD (chronic obstructive pulmonary disease) (HCC)    ? DM (diabetes mellitus) (HCC)     120's,130's   ? DOE (dyspnea on exertion)    ? Ex-smoker ? GERD (gastroesophageal reflux disease)    ? HLD (hyperlipidemia)    ? HTN (hypertension)    ? Hypothyroidism    ? MRSA (methicillin resistant Staphylococcus aureus)    ? NSTEMI (non-ST elevated myocardial infarction) (HCC)     1997, 2012   ? Obesity    ? OSA on CPAP    ? Postherpetic neuralgia    ? Prostate cancer [facility])      Surgical History:   Procedure Laterality Date   ? CORONARY STENT PLACEMENT  1997   ? CARDIAC SURGERY  2012    CABG   ? CYSTOSCOPY WITH TRANSURETHRAL PROSTATECTOMY N/A 01/05/2016    Performed by [facility]odo[facility] at [facility] OR   ? TUNNELED VENOUS PORT PLACEMENT Right 06/06/2017    right EJ port placement per IR   ? ABDOMEN SURGERY      Umbilical Hernia   ? HX HEART CATHETERIZATION     ? LEFT HEART CATHETERIZATION      s/p PCI ~1995       Social History:  Social History     Tobacco Use   ? Smoking status: Former Smoker     Packs/day: 1.00     Years: 30.00     Pack years: 30.00     Types: Cigarettes     Last attempt to quit: 02/01/1996     Years since quitting: 22.9   ? Smokeless tobacco: Never Used   Substance Use Topics   ? Alcohol use: Yes     Comment: rarely   ? Drug use: No     Social History     Substance and Sexual Activity   Drug Use No       Family History:  Family History   Problem Relation Age of Onset   ? Kidney Failure Sister    ? Tuberculosis Paternal Grandmother    ? Alcohol abuse Paternal Grandfather    ? Cancer Other 55        type unknown       Vitals:  ED Vitals    Date and Time T BP P RR SPO2P SPO2 User   12/31/18 1430 -- -- 77 24 PER MINUTE 79 100 % MW   12/31/18 1230 -- 137/70 81 22 PER MINUTE 83 100 % WT   12/31/18 1200 -- 141/78 -- -- 86 100 % WT   12/31/18 1144 36.3 ?C (97.4 ?F) 146/78 -- 16 PER MINUTE -- -- JF   12/31/18 1144 -- -- -- -- 82 100 % WT   12/31/18  1143 -- 146/78 -- -- -- -- WT          Physical Exam:  Physical Exam  Vitals signs and nursing note reviewed.   Constitutional:       General: He is not in acute distress. Appearance: He is well-developed. He is not diaphoretic.      Interventions: Nasal cannula in place.      Comments: 3L NC   HENT:      Head: Normocephalic and atraumatic.      Right Ear: External ear normal.      Left Ear: External ear normal.   Eyes:      Conjunctiva/sclera: Conjunctivae normal.   Neck:      Musculoskeletal: Neck supple.   Cardiovascular:      Rate and Rhythm: Normal rate and regular rhythm.      Heart sounds: Normal heart sounds.   Pulmonary:      Effort: Pulmonary effort is normal. No respiratory distress.      Breath sounds: Wheezing (upper airway, no lung) present. No rales.   Abdominal:      General: There is distension (slight).      Palpations: Abdomen is soft. There is no mass.   Musculoskeletal:         General: No deformity.   Skin:     General: Skin is warm and dry.      Coloration: Skin is jaundiced (slight).   Neurological:      Mental Status: He is alert and oriented to person, place, and time.   Psychiatric:         Behavior: Behavior normal.         Laboratory Results:  Labs Reviewed   CBC AND DIFF - Abnormal       Result Value Ref Range Status    White Blood Cells 3.5 (*) 4.5 - 11.0 K/UL Final    RBC 2.29 (*) 4.4 - 5.5 M/UL Final    Hemoglobin 6.2 (*) 13.5 - 16.5 GM/DL Final    Hematocrit 16.1 (*) 40 - 50 % Final    MCV 84.0  80 - 100 FL Final    MCH 27.1  26 - 34 PG Final    MCHC 32.3  32.0 - 36.0 G/DL Final    RDW 09.6 (*) 11 - 15 % Final    Platelet Count 83 (*) 150 - 400 K/UL Final    MPV 7.8  7 - 11 FL Final    Nucleated RBCs 1  K/UL Final    Segmented Neutrophils 76  41 - 77 % Final    Bands 5  0 - 10 % Final    Lymphocytes 4 (*) 24 - 44 % Final    Monocytes 5  4 - 12 % Final    Metamyelocyte 5  % Final    Myelocyte 5  % Final    ANISO PRESENT   Final    POLY PRESENT   Final    Teardrop PRESENT   Final    Platelet Estimate MOD DEC   Final    Absolute Neutrophil Count Manual 2.84  1.8 - 7.0 K/UL Final    MDW (Monocyte Distribution Width) 28.4 (*) <20.7 Final D-DIMER - Abnormal    D-Dimer 764 (*) <500 ng/mL FEU Final   COMPREHENSIVE METABOLIC PANEL - Abnormal    Sodium 136 (*) 137 - 147 MMOL/L Final    Potassium 3.7  3.5 - 5.1 MMOL/L Final    Chloride 99  98 - 110 MMOL/L Final    Glucose 169 (*) 70 - 100 MG/DL Final    Blood Urea Nitrogen 18  7 - 25 MG/DL Final    Creatinine 3.47 (*) 0.4 - 1.24 MG/DL Final    Calcium 8.6  8.5 - 10.6 MG/DL Final    Total Protein 6.2  6.0 - 8.0 G/DL Final    Total Bilirubin 0.9  0.3 - 1.2 MG/DL Final    Albumin 3.4 (*) 3.5 - 5.0 G/DL Final    Alk Phosphatase 176 (*) 25 - 110 U/L Final    AST (SGOT) 26  7 - 40 U/L Final    CO2 30  21 - 30 MMOL/L Final    ALT (SGPT) 13  7 - 56 U/L Final    Anion Gap 7  3 - 12 Final    eGFR Non African American 57 (*) >60 mL/min Final    eGFR African American >60  >60 mL/min Final   CORTISOL,RANDOM - Abnormal    Cortisol, Random 20.8 (*) 5.0 - 20.0 MCG/DL Final   BNP (B-TYPE NATRIURETIC PEPTI) - Abnormal    B Type Natriuretic Peptide 297.0 (*) 0 - 100 PG/ML Final   URINALYSIS DIPSTICK REFLEX TO CULTURE - Abnormal    Color,UA YELLOW   Final    Turbidity,UA CLEAR  CLEAR-CLEAR Final    Specific Gravity-Urine 1.017  1.003 - 1.035 Final    pH,UA 6.0  5.0 - 8.0 Final    Protein,UA NEG  NEG-NEG Final    Glucose,UA 3+ (*) NEG-NEG Final    Ketones,UA NEG  NEG-NEG Final    Bilirubin,UA NEG  NEG-NEG Final    Blood,UA NEG  NEG-NEG Final    Urobilinogen,UA INCREASED (*) NORM-NORMAL Final    Nitrite,UA NEG  NEG-NEG Final    Leukocytes,UA NEG  NEG-NEG Final    Urine Ascorbic Acid, UA NEG  NEG-NEG Final   BILIRUBIN, DIRECT - Abnormal    Bilirubin, Direct 0.4 (*) <0.4 MG/DL Final   POC GLUCOSE - Abnormal    Glucose, POC 179 (*) 70 - 100 MG/DL Final   BNP POC ER - Abnormal    BNP POC 448.0 (*) 0 - 100 PG/ML Final   COVID-19 (SARS-COV-2) PCR   PROTIME INR (PT)    INR 1.1  0.8 - 1.2 Final   PTT (APTT)    APTT 30.7  24.0 - 36.5 SEC Final   LIPASE    Lipase 27  11 - 82 U/L Final   MAGNESIUM Magnesium 1.8  1.6 - 2.6 mg/dL Final   PHOSPHORUS    Phosphorus 2.9  2.0 - 4.5 MG/DL Final   URINALYSIS MICROSCOPIC REFLEX TO CULTURE    WBCs,UA NONE  0 - 2 /HPF Final    RBCs,UA 0-2  0 - 3 /HPF Final    Comment,UA     Final    Value: Criteria for reflex to culture are WBC>10, Positive Nitrite, and/or >=+1   leukocytes. If quantity is not sufficient, an addendum will follow.     POC TROPONIN    Troponin-I-POC 0.02  0.00 - 0.05 NG/ML Final   HEMOGLOBIN A1C   UA REFLEX CULTURE LABEL   HEMOGLOBIN & HEMATOCRIT   POC TROPONIN   POC GLUCOSE   TYPE & CROSSMATCH    Units Ordered 1   Final    Crossmatch Expires 01/03/2019,2359   Final    Record Check FOUND   Final    ABO/RH(D) A POS   Final  Antibody Screen NEG   Final    Electronic Crossmatch YES   Final    Unit Number [id number]   Final    Blood Component Type RBC,ADSOL,LEUKO REDUCED   Final    Unit Division 0   Final    Status OF Unit ISSUED   Final    Transfusion Status OK TO TRANSFUSE   Final    Crossmatch Result COMPATIBLE,ELECTRONIC   Final   PREPARE RBC'S   TRANSFUSE RBC'S     POC Glucose (Download): (!) 179  Hemoccult  Hemoccult: (S) Positive  QC: Acceptable  Hem. Lot#: [id number]    Radiology Interpretation:    CHEST SINGLE VIEW   Final Result         Mild cardiomegaly without evidence of CHF. Poor depth of inspiration with mild bibasilar atelectasis and small pleural effusions.          Finalized by Golda Acre, M.D. on 12/31/2018 12:58 PM. Dictated by Golda Acre, M.D. on 12/31/2018 12:57 PM.         POC ED [place] CARDIAC LIMITED    (Results Pending)         EKG:    Sinus rhythm with a rate of 80, PR 156, QTc 457, no evidence of STEMI or ischemia, no specific changes as compared to previous EKG from February 2020.    ED Course:  On exam patient is nontoxic-appearing and in no acute distress.  He does appear slightly pale and has a overall withdrawn mood concerning for undiagnosed depression.  Differential for patient's current symptoms includes atypical presentation of ACS, electrolyte abnormality, anemia, less likely COVID-19.  Is also possible that patient symptoms are secondary to abruptly discontinuing his medications a week ago.  At this time obtain basic work-up for this patient including broad laboratory studies, EKG, chest x-ray, troponin and BNP.  Fecal occult blood testing is positive for occult blood in patient's stool but there is no gross blood.    Point-of-care bedside ultrasound is remarkable for mildly reduced ejection fraction but no evidence of a pericardial effusion.  Chest x-ray is unremarkable.  Laboratory studies are remarkable for a hemoglobin of 6.2, NDW of 28.4, creatinine of 1.26 which is similar to previous values for this patient, BNP of 297.  As patient's occult blood is positive, hemoglobin is less than 7 I will transfuse this patient 1 unit of blood and will admit him to the hospital for further evaluation and management of his symptoms.  At the time of admission patient is hemodynamically stable in no acute distress.       ED Scoring:                             MDM  Reviewed: previous chart, vitals and nursing note  Reviewed previous: labs, ECG and x-ray  Interpretation: labs, x-ray and ECG        Facility Administered Meds:  Medications   pantoprazole (PROTONIX) injection 40 mg (has no administration in time range)   sodium chloride 0.9 %   infusion (0 mL Intravenous Infusion Stopped 12/31/18 1342)         Clinical Impression:  Clinical Impression   Weakness   Gastrointestinal hemorrhage, unspecified gastrointestinal hemorrhage type   Anemia, unspecified type       Disposition/Follow up  ED Disposition     ED Disposition    Admit        No follow-up provider specified.  Medications:  New Prescriptions    No medications on file       Procedure Notes:  Procedures      Attestation / Supervision:  I, Rockne Menghini, am scribing for and in the presence of Alvy Bimler, MD.      Rockne Menghini Attestation / Supervision Note concerning Corrinne Eagle: I personally performed the E/M including history, physical exam, and MDM.    Harless Nakayama, MD

## 2018-12-31 NOTE — H&P (View-Only)
Name:  [name]                                             MRN:  [id number]   Admission Date:  12/31/2018                     Assessment/Plan:        GI bleeding  - presents with weakness and fatigue and melena  - admission hg 6.2, rectal exam positive in ed  - s/p 1 unit of PRBC in ED  - - cbc every 8 hours, wil transfuse to keep Hg >8 given hx of cad  - NPO, protonix, fluids, GI consulted  - he is on edoxaban for LLE DVT last year, hasnt taken it for 3 years though    Pancytopenia  - last chemo for metastatic prostate cancer 2 weeks ago  - all cell lines dropped compared with his baseline  - transfuse as needed    Castrate-resistant prostate cancer, 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  -?CABG in 2012 through [place].   - PTA Lisinopril, Carvedilol, Lasix on hold for GI bleeding, will resume gradually  - PTA ASA 81mg  &?edoxaban on hold for Gi bleeding    DM 2/Hypothyroidism  - PTA Synthroid ?- continue  -?continue PTA insulin, reduce the dose of lantus because of NP{O status after MN  ?    Hx taken from daughter at bedside    Discussed with GI: egd and colonoscopy tomorrow    ______________________________________________________________________________    Primary Care Physician: Lona Kettle     Chief Complaint: weakness and fatigue    History of Present Illness: Alexander Holland is a 68 y.o. male with PMH of as below started taking all of his meds 3 days ago because of weakness and fatigue, he has complex medical hx. He denies any cp, sob, fever, abd pain but reports diarrhea with tarry colored black stools, no hematochezia, no nausea, no vomiting. He has metastatic prostate cancer, on chemo, last session 2 weeks ago. Patient states he lives at home with his wife in [place] and his daughter recommended he come be evaluated today. In Ed he was found to have Hg of 6.2, transfused one unit, rectal exam was positive for blood.     Medical History:   Diagnosis Date   ? Anemia    ? Arthritis     DJD-Hip   ? Asthma    ? CAD (coronary artery disease)    ? CHF (congestive heart failure) (HCC) 08/2015   ? Chronic renal insufficiency    ? COPD (chronic obstructive pulmonary disease) (HCC)    ? DM (diabetes mellitus) (HCC)     120's,130's   ? DOE (dyspnea on exertion)    ? Ex-smoker    ? GERD (gastroesophageal reflux disease)    ? HLD (hyperlipidemia)    ? HTN (hypertension)    ? Hypothyroidism    ?  MRSA (methicillin resistant Staphylococcus aureus)    ? NSTEMI (non-ST elevated myocardial infarction) (HCC)     1997, 2012   ? Obesity    ? OSA on CPAP    ? Postherpetic neuralgia    ? Prostate cancer [facility])      Surgical History:   Procedure Laterality Date   ? CORONARY STENT PLACEMENT  1997   ? CARDIAC SURGERY  2012    CABG   ? CYSTOSCOPY WITH TRANSURETHRAL PROSTATECTOMY N/A 01/05/2016    Performed by Mittie Bodo[facility] MD at [facility] OR   ? TUNNELED VENOUS PORT PLACEMENT Right 06/06/2017    right EJ port placement per IR   ? ABDOMEN SURGERY      Umbilical Hernia   ? HX HEART CATHETERIZATION     ? LEFT HEART CATHETERIZATION      s/p PCI ~1995     Family History   Problem Relation Age of Onset   ? Kidney Failure Sister    ? Tuberculosis Paternal Grandmother    ? Alcohol abuse Paternal Grandfather    ? Cancer Other 55        type unknown     Social History     Socioeconomic History   ? Marital status: Married     Spouse name: Not on file   ? Number of children: Not on file   ? Years of education: Not on file   ? Highest education level: Not on file   Occupational History   ? Not on file   Tobacco Use   ? Smoking status: Former Smoker Packs/day: 1.00     Years: 30.00     Pack years: 30.00     Types: Cigarettes     Last attempt to quit: 02/01/1996     Years since quitting: 22.9   ? Smokeless tobacco: Never Used   Substance and Sexual Activity   ? Alcohol use: Yes     Comment: rarely   ? Drug use: No   ? Sexual activity: Not on file   Other Topics Concern   ? Not on file   Social History Narrative   ? Not on file      Immunizations (includes history and patient reported):   Immunization History   Administered Date(s) Administered   ? Pneumococcal Vaccine (23-Val Adult) 02/09/2011           Allergies:  Gadolinium-containing contrast media and Multihance [gadobenate dimeglumine]    Medications:  (Not in a hospital admission)    Review of Systems:  A comprehensive  12 point review of organ systems reviewed and was negative except for the ones mentioned in HOPI    Physical Exam:  Vital Signs: Last Filed In 24 Hours Vital Signs: 24 Hour Range   BP: 137/70 (09/17 1230)  Temp: 36.3 ?C (97.4 ?F) (09/17 1144)  Pulse: 81 (09/17 1230)  Respirations: 22 PER MINUTE (09/17 1230)  SpO2: 100 % (09/17 1230)  SpO2 Pulse: 83 (09/17 1230) BP: (137-146)/(70-78)   Temp:  [36.3 ?C (97.4 ?F)]   Pulse:  [81]   Respirations:  [16 PER MINUTE-22 PER MINUTE]   SpO2:  [100 %]           General:  Alert, awake, oriented x 3 , cooperative, no distress, appears stated age  Head:  Normocephalic, without obvious abnormality, atraumatic  Eyes:  Conjunctivae/corneas clear   Nose: Nares normal. Mucosa normal.  No drainage or sinus tenderness  Throat: Lips, mucosa and tongue normal  Neck:    Supple, symmetrical, trachea midline, no adenopathy, thyroid: no enlargement/tenderness/nodules  Lungs:  Clear to auscultation bilaterally  Heart:   Regular rate and rhythm, S1, S2 normal, no murmur  Abdomen:  Soft, non-tender.  Bowel sounds normal.  No masses.  No organomegaly.  Extremities: Extremities normal, atraumatic, no cyanosis or edema  Skin: Skin color, texture, turgor normal. Lymph nodes:  Cervical, supraclavicular and axillary nodes normal  Neurologic: Non focal grossly    Lab/Radiology/Other Diagnostic Tests:  24-hour labs:    Results for orders placed or performed during the hospital encounter of 12/31/18 (from the past 24 hour(s))   CBC AND DIFF    Collection Time: 12/31/18 12:26 PM   Result Value Ref Range    White Blood Cells 3.5 (L) 4.5 - 11.0 K/UL    RBC 2.29 (L) 4.4 - 5.5 M/UL    Hemoglobin 6.2 (L) 13.5 - 16.5 GM/DL    Hematocrit 16.1 (L) 40 - 50 %    MCV 84.0 80 - 100 FL    MCH 27.1 26 - 34 PG    MCHC 32.3 32.0 - 36.0 G/DL    RDW 09.6 (H) 11 - 15 %    Platelet Count 83 (L) 150 - 400 K/UL    MPV 7.8 7 - 11 FL    Nucleated RBCs 1 K/UL    Segmented Neutrophils 76 41 - 77 %    Bands 5 0 - 10 %    Lymphocytes 4 (L) 24 - 44 %    Monocytes 5 4 - 12 %    Metamyelocyte 5 %    Myelocyte 5 %    ANISO PRESENT     POLY PRESENT     Teardrop PRESENT     Platelet Estimate MOD DEC     Absolute Neutrophil Count Manual 2.84 1.8 - 7.0 K/UL    MDW (Monocyte Distribution Width) 28.4 (H) <20.7   D-DIMER    Collection Time: 12/31/18 12:26 PM   Result Value Ref Range    D-Dimer 764 (H) <500 ng/mL FEU   PROTIME INR (PT)    Collection Time: 12/31/18 12:26 PM   Result Value Ref Range    INR 1.1 0.8 - 1.2   PTT (APTT)    Collection Time: 12/31/18 12:26 PM   Result Value Ref Range    APTT 30.7 24.0 - 36.5 SEC   COMPREHENSIVE METABOLIC PANEL    Collection Time: 12/31/18 12:26 PM   Result Value Ref Range    Sodium 136 (L) 137 - 147 MMOL/L    Potassium 3.7 3.5 - 5.1 MMOL/L    Chloride 99 98 - 110 MMOL/L    Glucose 169 (H) 70 - 100 MG/DL    Blood Urea Nitrogen 18 7 - 25 MG/DL    Creatinine 0.45 (H) 0.4 - 1.24 MG/DL    Calcium 8.6 8.5 - 40.9 MG/DL    Total Protein 6.2 6.0 - 8.0 G/DL    Total Bilirubin 0.9 0.3 - 1.2 MG/DL    Albumin 3.4 (L) 3.5 - 5.0 G/DL    Alk Phosphatase 811 (H) 25 - 110 U/L    AST (SGOT) 26 7 - 40 U/L    CO2 30 21 - 30 MMOL/L    ALT (SGPT) 13 7 - 56 U/L    Anion Gap 7 3 - 12 eGFR Non African American 57 (L) >60 mL/min    eGFR African American >60 >60 mL/min   LIPASE    Collection  Time: 12/31/18 12:26 PM   Result Value Ref Range    Lipase 27 11 - 82 U/L   CORTISOL,RANDOM    Collection Time: 12/31/18 12:26 PM   Result Value Ref Range    Cortisol, Random 20.8 (H) 5.0 - 20.0 MCG/DL   MAGNESIUM    Collection Time: 12/31/18 12:26 PM   Result Value Ref Range    Magnesium 1.8 1.6 - 2.6 mg/dL   BNP (B-TYPE NATRIURETIC PEPTI)    Collection Time: 12/31/18 12:26 PM   Result Value Ref Range    B Type Natriuretic Peptide 297.0 (H) 0 - 100 PG/ML   PHOSPHORUS    Collection Time: 12/31/18 12:26 PM   Result Value Ref Range    Phosphorus 2.9 2.0 - 4.5 MG/DL   BILIRUBIN, DIRECT    Collection Time: 12/31/18 12:26 PM   Result Value Ref Range    Bilirubin, Direct 0.4 (H) <0.4 MG/DL   POC GLUCOSE    Collection Time: 12/31/18 12:29 PM   Result Value Ref Range    Glucose, POC 179 (H) 70 - 100 MG/DL   POC TROPONIN    Collection Time: 12/31/18 12:30 PM   Result Value Ref Range    Troponin-I-POC 0.02 0.00 - 0.05 NG/ML   BNP POC ER    Collection Time: 12/31/18 12:30 PM   Result Value Ref Range    BNP POC 448.0 (H) 0 - 100 PG/ML   URINALYSIS DIPSTICK REFLEX TO CULTURE    Collection Time: 12/31/18  1:04 PM    Specimen: Urine   Result Value Ref Range    Color,UA YELLOW     Turbidity,UA CLEAR CLEAR-CLEAR    Specific Gravity-Urine 1.017 1.003 - 1.035    pH,UA 6.0 5.0 - 8.0    Protein,UA NEG NEG-NEG    Glucose,UA 3+ (A) NEG-NEG    Ketones,UA NEG NEG-NEG    Bilirubin,UA NEG NEG-NEG    Blood,UA NEG NEG-NEG    Urobilinogen,UA INCREASED (A) NORM-NORMAL    Nitrite,UA NEG NEG-NEG    Leukocytes,UA NEG NEG-NEG    Urine Ascorbic Acid, UA NEG NEG-NEG   URINALYSIS MICROSCOPIC REFLEX TO CULTURE    Collection Time: 12/31/18  1:04 PM    Specimen: Urine   Result Value Ref Range    WBCs,UA NONE 0 - 2 /HPF    RBCs,UA 0-2 0 - 3 /HPF    Comment,UA       Criteria for reflex to culture are WBC>10, Positive Nitrite, and/or >=+1 leukocytes. If quantity is not sufficient, an addendum will follow.       Glucose: (!) 169 (12/31/18 1226)  POC Glucose (Download): (!) 179 (12/31/18 1229)  Hemoccult: (S) Positive (12/31/18 1342)  Pertinent radiology reviewed.    Chest Single View    Result Date: 12/31/2018  Mild cardiomegaly without evidence of CHF. Poor depth of inspiration with mild bibasilar atelectasis and small pleural effusions.  Finalized by Golda Acre, M.D. on 12/31/2018 12:58 PM. Dictated by Golda Acre, M.D. on 12/31/2018 12:57 PM.

## 2018-12-31 NOTE — ED Notes
A 68 y.o male brought to ED by EMS for generalized weakness and he stopped taking all his medications a week ago.  Patient c/o diarrhea for one week.  Patient is pale, warm and dry.  No edema.  VSS.  Physician at bedside for assessment.    Belongings: shirt, pants, underwear, slippers and cell phone.

## 2019-01-01 ENCOUNTER — Encounter: Admit: 2019-01-01 | Discharge: 2019-01-01 | Payer: MEDICARE

## 2019-01-01 ENCOUNTER — Inpatient Hospital Stay: Admit: 2019-01-01 | Discharge: 2019-01-01 | Payer: MEDICARE

## 2019-01-01 LAB — CBC
Lab: 19 % — ABNORMAL HIGH (ref 11–15)
Lab: 19 % — ABNORMAL HIGH (ref 11–15)
Lab: 19 % — ABNORMAL HIGH (ref 11–15)
Lab: 2.6 M/UL — ABNORMAL LOW (ref 4.4–5.5)
Lab: 25 % — ABNORMAL LOW (ref 40–50)
Lab: 26 pg (ref 26–34)
Lab: 27 pg (ref 26–34)
Lab: 27 pg (ref >60)
Lab: 3 M/UL — ABNORMAL LOW (ref 4.4–5.5)
Lab: 3.2 M/UL — ABNORMAL LOW (ref 4.4–5.5)
Lab: 3.2 M/UL — ABNORMAL LOW (ref 4.4–5.5)
Lab: 3.7 10*3/uL — ABNORMAL LOW (ref 4.5–11.0)
Lab: 32 g/dL (ref 32.0–36.0)
Lab: 32 g/dL (ref >60)
Lab: 32 g/dL — ABNORMAL HIGH (ref 32.0–36.0)
Lab: 4 K/UL — ABNORMAL LOW (ref 4.5–11.0)
Lab: 5 10*3/uL (ref 4.5–11.0)
Lab: 5.4 K/UL (ref 4.5–11.0)
Lab: 5.8 10*3/uL — ABNORMAL HIGH (ref 4.5–11.0)
Lab: 7.6 FL (ref 7–11)
Lab: 7.7 FL (ref 7–11)
Lab: 8 FL (ref 7–11)
Lab: 8.1 g/dL — ABNORMAL LOW (ref 13.5–16.5)
Lab: 8.3 FL (ref 7–11)
Lab: 8.7 g/dL — ABNORMAL LOW (ref 13.5–16.5)
Lab: 80 10*3/uL — ABNORMAL LOW (ref 150–400)
Lab: 82 10*3/uL — ABNORMAL LOW (ref 150–400)
Lab: 82 FL (ref 80–100)
Lab: 83 10*3/uL — ABNORMAL LOW (ref 150–400)
Lab: 83 FL (ref 80–100)
Lab: 84 FL (ref 80–100)
Lab: 94 10*3/uL — ABNORMAL LOW (ref 150–400)

## 2019-01-01 LAB — POC GLUCOSE
Lab: 135 mg/dL — ABNORMAL HIGH (ref 70–100)
Lab: 198 mg/dL — ABNORMAL HIGH (ref 70–100)

## 2019-01-01 LAB — MAGNESIUM: Lab: 1.7 mg/dL — ABNORMAL LOW (ref 1.6–2.6)

## 2019-01-01 LAB — COMPREHENSIVE METABOLIC PANEL: Lab: 137 MMOL/L — ABNORMAL LOW (ref 137–147)

## 2019-01-01 LAB — C DIFFICILE BY PCR: Lab: POSITIVE

## 2019-01-01 MED ORDER — HALOPERIDOL LACTATE 5 MG/ML IJ SOLN
1 mg | Freq: Once | INTRAVENOUS | 0 refills | Status: AC | PRN
Start: 2019-01-01 — End: ?

## 2019-01-01 MED ORDER — VANCOMYCIN 25 MG/ML PO SOLR
125 mg | Freq: Four times a day (QID) | ORAL | 0 refills | Status: DC
Start: 2019-01-01 — End: 2019-01-03
  Administered 2019-01-01 – 2019-01-03 (×8): 125 mg via ORAL

## 2019-01-01 MED ORDER — MAGNESIUM SULFATE IN D5W 1 GRAM/100 ML IV PGBK
1 g | INTRAVENOUS | 0 refills | Status: CP
Start: 2019-01-01 — End: ?
  Administered 2019-01-01 (×2): 1 g via INTRAVENOUS

## 2019-01-01 MED ORDER — LIDOCAINE (PF) 200 MG/10 ML (2 %) IJ SYRG
0 refills | Status: DC
Start: 2019-01-01 — End: 2019-01-01
  Administered 2019-01-01: 20:00:00 60 mg via INTRAVENOUS

## 2019-01-01 MED ORDER — LACTATED RINGERS IV SOLP
1000 mL | INTRAVENOUS | 0 refills | Status: DC
Start: 2019-01-01 — End: 2019-01-02
  Administered 2019-01-01: 19:00:00 1000 mL via INTRAVENOUS

## 2019-01-01 MED ORDER — ONDANSETRON HCL (PF) 4 MG/2 ML IJ SOLN
4 mg | Freq: Once | INTRAVENOUS | 0 refills | Status: AC | PRN
Start: 2019-01-01 — End: ?

## 2019-01-01 MED ORDER — PROPOFOL 10 MG/ML IV EMUL 20 ML (INFUSION)(AM)(OR)
INTRAVENOUS | 0 refills | Status: DC
Start: 2019-01-01 — End: 2019-01-01
  Administered 2019-01-01: 20:00:00 140 ug/kg/min via INTRAVENOUS

## 2019-01-01 MED ORDER — FENTANYL CITRATE (PF) 50 MCG/ML IJ SOLN
25-50 ug | INTRAVENOUS | 0 refills | Status: DC | PRN
Start: 2019-01-01 — End: 2019-01-03

## 2019-01-01 MED ORDER — POTASSIUM CHLORIDE 20 MEQ PO TBTQ
40 meq | Freq: Once | ORAL | 0 refills | Status: CP
Start: 2019-01-01 — End: ?
  Administered 2019-01-01: 13:00:00 40 meq via ORAL

## 2019-01-01 MED ORDER — ALBUTEROL SULFATE 2.5 MG /3 ML (0.083 %) IN NEBU
2.5 mg | RESPIRATORY_TRACT | 0 refills | Status: DC | PRN
Start: 2019-01-01 — End: 2019-01-03

## 2019-01-01 NOTE — Progress Notes
2145 - Assumed care, [place] [id number] per cart, monitored, with belongings, PRBC transfusing, appropriate PPE donned, tolerated well.  Care assumed by [facility] on CA11.

## 2019-01-01 NOTE — Progress Notes
Patient arrived to room # [phone]) via bed accompanied by RN. Patient transferred to the bed with assistance. Bedside safety checks completed. Initial patient assessment completed. Refer to flowsheet for details.    Admission skin assessment completed with: Jerene Pitch, RN    Pressure injury present on arrival?: No    1. Head/Face/Neck: No  2. Trunk/Back: No  3. Upper Extremities: No  4. Lower Extremities: No  5. Pelvic/Coccyx: No  6. Assessed for device associated injury? Yes  7. Malnutrition Screening Tool (Nursing Nutrition Assessment) Completed? Yes    See Doc Flowsheet for additional wound details.     INTERVENTIONS:   Bil ears blanchable, foam cushions placed on oxygen tubing. MST score 1.

## 2019-01-01 NOTE — Anesthesia Pre-Procedure Evaluation
Anesthesia Pre-Procedure Evaluation    Name: Alexander Holland      MRN: [id number]     DOB: 1950/05/31     Age: 68 y.o.     Sex: male   __________________________________________________________________________     Procedure Date: 01/01/2019   Procedure: Procedure(s):  ESOPHAGOGASTRODUODENOSCOPY WITH BIOPSY - FLEXIBLE  COLONOSCOPY WITH BIOPSY - FLEXIBLE     Physical Assessment  Vital Signs (last filed in past 24 hours):  BP: 129/55 (09/18 1141)  Temp: 37.1 ?C (98.7 ?F) (09/18 1141)  Pulse: 75 (09/18 1141)  Respirations: 18 PER MINUTE (09/18 1141)  SpO2: 100 % (09/18 1141)  Height: 170.2 cm (67) (09/17 2224)  Weight: 90.9 kg (200 lb 6.4 oz) (09/17 2224)      Patient History  Allergies   Allergen Reactions   ? Gadolinium-Containing Contrast Media ANAPHYLAXIS     Pt coded after receiving contrast medium in MRI- If Contrast MRI study needed, pt MUST be scheduled at main campus with pre-medications and radiologist recomendations   ? Multihance [Gadobenate Dimeglumine] ANAPHYLAXIS     Pt coded after receiving contrast medium in MRI- If Contrast MRI study needed, pt MUST be scheduled at main campus with pre-medications and radiologist recomendations        Current Medications    Medication Directions   ACCU-CHEK SOFTCLIX LANCETS MISC Use  as directed twice daily.     albuterol (VENTOLIN HFA, PROAIR HFA) 90 mcg/actuation inhaler Inhale 2 Puffs by mouth every 6 hours as needed.     albuterol 0.083% (PROVENTIL; VENTOLIN) 2.5 mg /3 mL (0.083 %) nebulizer solution Inhale 3 mL solution by nebulizer as directed every 4 hours as needed for Wheezing or Shortness of Breath.   aspirin EC 81 mg tablet Take 81 mg by mouth at bedtime daily. Take with food.   atorvastatin (LIPITOR) 80 mg tablet Take 80 mg by mouth at bedtime daily.   blood sugar diagnostic (ACCU-CHEK AVIVA PLUS) test strip 1 Strip by Test route before meals and at bedtime.     carvedilol (COREG) 3.125 mg tablet Take one tablet by mouth twice daily with meals. Take with food.   cholecalciferol (VITAMIN D-3) 1,000 units tablet Take 1,000 Units by mouth twice daily.   edoxaban (SAVAYSA) 60 mg tab tablet Take one tablet by mouth daily.   empagliflozin (JARDIANCE) 10 mg tablet Take one tablet by mouth daily.   enzalutamide (XTANDI) 40 mg capsule Take four capsules by mouth daily. Take at the same time every day.   EPINEPHrine (EPIPEN) 1 mg/mL injection pen (2-Pack) Inject 0.3 mg (1 Pen) into thigh if needed for anaphylactic reaction. May repeat in 5-15 minutes if needed.  Indications: a significant type of allergic reaction called anaphylaxis   ferrous sulfate 325 mg (65 mg iron) tablet Take one tablet by mouth twice daily with meals.   furosemide (LASIX) 40 mg tablet Take one tablet by mouth twice daily. Morning and afternoon   gabapentin (NEURONTIN) 600 mg tablet Take one tablet by mouth every 8 hours.   HYDROcodone/acetaminophen (NORCO) 10/325 mg tablet Take one tablet by mouth every 6 hours as needed for Pain   insulin aspart U-100 (NOVOLOG FLEXPEN) 100 unit/mL (3 mL) injection PEN Inject twelve Units under the skin three times daily with meals.   insulin glargine (LANTUS SOLOSTAR) 100 unit/mL (3 mL) injection PEN Inject sixty Units under the skin at bedtime daily.   levothyroxine (SYNTHROID) 25 mcg tablet Take 25 mcg by mouth daily. Before breakfast  lisinopril (PRINIVIL, ZESTRIL) 2.5 mg tablet Take 1 tablet by mouth daily.   metFORMIN (GLUCOPHAGE) 1,000 mg tablet Take 1,000 mg by mouth twice daily with meals.     morphine SR (MS CONTIN) 15 mg tablet Take one tablet by mouth at bedtime daily   nitroglycerin (NITROSTAT) 0.4 mg tablet Place 0.4 mg under tongue every 5 minutes as needed.     omeprazole DR(+) (PRILOSEC) 20 mg capsule Take 20 mg by mouth daily.     ondansetron (ZOFRAN) 8 mg tablet Take one tablet by mouth every 8 hours as needed (nausea and vomiting).   ondansetron (ZOFRAN) 8 mg tablet Take one tablet by mouth every 8 hours as needed for Nausea or Vomiting. Indications: nausea and vomiting caused by cancer drugs   predniSONE (DELTASONE) 10 mg tablet Take one tablet by mouth daily with breakfast.   prochlorperazine maleate (COMPAZINE) 10 mg tablet Take one tablet by mouth every 6 hours as needed for Nausea or Vomiting.   senna (SENOKOT) 8.6 mg tablet Take 2 tablets by mouth at bedtime as needed for Constipation.   sertraline (ZOLOFT) 50 mg tablet Take 1 tablet by mouth daily.   triamterene-hydrochlorothiazide (DYAZIDE) 37.5-25 mg capsule Take 1 capsule by mouth every morning.         Review of Systems/Medical History      Patient summary reviewed  Nursing notes reviewed  Pertinent labs reviewed    PONV Screening: Non-smoker  No history of anesthetic complications  No family history of anesthetic complications      Airway - negative        Pulmonary       Smoker: quit 1997, 30 pack year history.        Asthma (controlled, albuterol 1-2times/week)    COPD      No indications/hx of pneumonia (2012)      No shortness of breath      Home oxygen use (3L )        Sleep apnea          Interventions: CPAP; compliant      Last attempt at this procedure cancelled 2/2 hypoxemia      Cardiovascular       Recent diagnostic studies:          echocardiogram          1. Technically difficult study with poor endocardial visualization.  2. Normal left ventricular cavity size with moderately reduced function  3. Estimated ejection fraction 40 to 45%  4. Grade 1 diastolic dysfunction  5. Valvular structures not well seen but no significant valvular Doppler abnormalities are noted within the limitations of the study  6. No pericardial effusion  7. 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    Abdominal Findings:       Obese      Abdominal exam deferred    Neurological Findings:       Alert and oriented x 3    Normal mental status    Constitutional findings:       No acute distress       Diagnostic Tests  Hematology:   Lab Results   Component Value Date    HGB 8.7 01/01/2019    HCT 26.6 01/01/2019    PLTCT 94 01/01/2019    WBC 5.8 01/01/2019    NEUT 66 12/17/2018    ANC 2.84 12/31/2018    ANC 2.40 12/17/2018    LYMPH 4 12/31/2018    ALC 0.60 12/17/2018    MONA 11 12/17/2018    AMC 0.40 12/17/2018    EOSA 6 12/17/2018    ABC 0.00 12/17/2018    BASOPHILS 1 06/30/2017    MCV 83.3 01/01/2019    MCH 27.1 01/01/2019    MCHC 32.5 01/01/2019    MPV 8.3 01/01/2019    RDW 19.4 01/01/2019         General Chemistry:   Lab Results   Component Value Date    NA 137 01/01/2019    K 3.7 01/01/2019    CL 100 01/01/2019    CO2 27 01/01/2019    GAP 10 01/01/2019    BUN 12 01/01/2019    CR 1.10 01/01/2019    GLU 124 01/01/2019    CA 8.5 01/01/2019    ALBUMIN 3.5 01/01/2019    LACTIC 1.5 02/20/2011    MG 1.7 01/01/2019    TOTBILI 1.2 01/01/2019    PO4 2.9 12/31/2018      Coagulation:   Lab Results Component Value Date    PTT 30.7 12/31/2018    INR 1.1 12/31/2018         Anesthesia Plan    ASA score: 3   Plan: MAC  Induction method: intravenous  NPO status: acceptable      Informed Consent  Anesthetic plan and risks discussed with patient.  Use of blood products discussed with patient      Plan discussed with: CRNA.

## 2019-01-01 NOTE — ED Notes
Report given to [facility] on CA11

## 2019-01-01 NOTE — Anesthesia Post-Procedure Evaluation
Post-Anesthesia Evaluation    Name: Alexander Holland      MRN: [id number]     DOB: [birth year]     Age: 68 y.o.     Sex: male   __________________________________________________________________________     Procedure Information     Anesthesia Start Date/Time:  01/01/19 1502    Procedures:       ESOPHAGOGASTRODUODENOSCOPY WITH BIOPSY - FLEXIBLE (N/A )      COLONOSCOPY WITH BIOPSY - FLEXIBLE (N/A )    Location:  ENDO 4 / ENDO/GI    Surgeon:  Marrianne Mood, MD          Post-Anesthesia Vitals  BP: 124/58 (09/18 1630)  Temp: 36.3 C (97.3 F) (09/18 1620)  Pulse: 76 (09/18 1630)  SpO2: 95 % (09/18 1630)  SpO2 Pulse: 76 (09/18 1630)   Vitals Value Taken Time   BP 124/58 01/01/2019  4:30 PM   Temp 36.3 C (97.3 F) 01/01/2019  4:20 PM   Pulse 76 01/01/2019  4:30 PM   Respirations     SpO2 95 % 01/01/2019  4:30 PM         Post Anesthesia Evaluation Note    Evaluation location: Pre/Post  Patient participation: recovered; patient participated in evaluation  Level of consciousness: alert    Pain score: 0  Pain management: adequate    Hydration: normovolemia  Temperature: 36.0C - 38.4C  Airway patency: adequate    Perioperative Events       Post-op nausea and vomiting: no PONV    Postoperative Status  Cardiovascular status: hemodynamically stable  Respiratory status: spontaneous ventilation        Perioperative Events  Perioperative Event: No  Emergency Case Activation: No

## 2019-01-02 ENCOUNTER — Encounter: Admit: 2019-01-02 | Discharge: 2019-01-02 | Payer: MEDICARE

## 2019-01-02 LAB — CBC
Lab: 2.8 M/UL — ABNORMAL LOW (ref 4.4–5.5)
Lab: 24 % — ABNORMAL LOW (ref 40–50)
Lab: 27 pg (ref 26–34)
Lab: 3.3 10*3/uL — ABNORMAL LOW (ref 4.5–11.0)
Lab: 32 g/dL (ref 32.0–36.0)
Lab: 7.8 g/dL — ABNORMAL LOW (ref 13.5–16.5)
Lab: 8 FL (ref 7–11)
Lab: 80 10*3/uL — ABNORMAL LOW (ref 150–400)
Lab: 84 FL (ref 80–100)
Lab: 3 K/UL — ABNORMAL LOW (ref 4.5–11.0)
Lab: 26 % — ABNORMAL LOW (ref 40–50)
Lab: 27 pg (ref 26–34)
Lab: 3.1 M/UL — ABNORMAL LOW (ref 4.4–5.5)
Lab: 3.6 10*3/uL — ABNORMAL LOW (ref 4.5–11.0)
Lab: 31 g/dL — ABNORMAL LOW (ref 32.0–36.0)
Lab: 8.4 g/dL — ABNORMAL LOW (ref 13.5–16.5)
Lab: 86 FL — ABNORMAL HIGH (ref 80–100)

## 2019-01-02 LAB — POC GLUCOSE
Lab: 175 mg/dL — ABNORMAL HIGH (ref 70–100)
Lab: 153 mg/dL — ABNORMAL HIGH (ref 70–100)
Lab: 210 mg/dL — ABNORMAL HIGH (ref 70–100)
Lab: 270 mg/dL — ABNORMAL HIGH (ref 70–100)

## 2019-01-02 LAB — MAGNESIUM: Lab: 2.2 mg/dL (ref 1.6–2.6)

## 2019-01-02 LAB — COMPREHENSIVE METABOLIC PANEL: Lab: 138 MMOL/L — ABNORMAL LOW (ref >60)

## 2019-01-03 ENCOUNTER — Encounter: Admit: 2019-01-03 | Discharge: 2019-01-03 | Payer: MEDICARE

## 2019-01-03 LAB — POC GLUCOSE
Lab: 213 mg/dL — ABNORMAL HIGH (ref 70–100)
Lab: 162 mg/dL — ABNORMAL HIGH (ref 70–100)

## 2019-01-03 LAB — CBC
Lab: 2.8 M/UL — ABNORMAL LOW (ref 4.4–5.5)
Lab: 3.6 K/UL — ABNORMAL LOW (ref >60)

## 2019-01-03 LAB — COMPREHENSIVE METABOLIC PANEL
Lab: 137 MMOL/L — ABNORMAL LOW (ref 137–147)
Lab: 3.8 MMOL/L — ABNORMAL LOW (ref 3.5–5.1)

## 2019-01-03 LAB — MAGNESIUM: Lab: 1.9 mg/dL — ABNORMAL LOW (ref 1.6–2.6)

## 2019-01-03 MED ORDER — EDOXABAN 60 MG PO TAB
60 mg | ORAL_TABLET | Freq: Every day | ORAL | 3 refills | Status: AC
Start: 2019-01-03 — End: ?
  Filled 2019-03-16: qty 30, 30d supply, fill #1

## 2019-01-03 MED ORDER — VANCOMYCIN 125 MG PO CAP
125 mg | ORAL_CAPSULE | Freq: Four times a day (QID) | ORAL | 0 refills | 10.00000 days | Status: AC
Start: 2019-01-03 — End: ?
  Filled 2019-01-03: qty 32, 8d supply, fill #1

## 2019-01-03 MED ORDER — OMEPRAZOLE 20 MG PO CPDR
20 mg | ORAL_CAPSULE | Freq: Two times a day (BID) | ORAL | 0 refills | Status: AC
Start: 2019-01-03 — End: ?
  Filled 2019-01-03: qty 90, 45d supply, fill #1

## 2019-01-03 MED ORDER — VANCOMYCIN 25 MG/ML PO SOLR
125 mg | Freq: Four times a day (QID) | ORAL | 0 refills | 10.00000 days | Status: DC
Start: 2019-01-03 — End: 2019-01-03

## 2019-01-05 ENCOUNTER — Encounter: Admit: 2019-01-05 | Discharge: 2019-01-05 | Payer: MEDICARE

## 2019-01-06 ENCOUNTER — Encounter: Admit: 2019-01-06 | Discharge: 2019-01-06 | Payer: MEDICARE

## 2019-01-07 ENCOUNTER — Encounter: Admit: 2019-01-07 | Discharge: 2019-01-07 | Payer: MEDICARE

## 2019-01-07 MED ORDER — MORPHINE 15 MG PO TBER
15 mg | ORAL_TABLET | Freq: Every evening | ORAL | 0 refills | 7.00000 days | Status: DC
Start: 2019-01-07 — End: 2019-03-17

## 2019-01-07 NOTE — Progress Notes
Name: TIERNAN SAADE          MRN: [id number]      DOB: December 13, 1950      AGE: 68 y.o.   DATE OF SERVICE: 01/07/2019    Subjective:             Reason for Visit:  Follow Up      CEPHAS VALDOVINOS is a 68 y.o. male.     Cancer Staging  Prostate cancer [facility])  Staging form: Prostate, AJCC 7th Edition  - Clinical stage from 01/31/2016: Stage IV (T3b, N1, M1b, PSA: Less than 10, Gleason 8-10) - Signed by Ross Marcus, MD on 01/31/2016    Onc Timeline   Prostate cancer (HCC)   01/05/2016 Surgery    Transurethral resection of prostate: Pathology revealed Gleason 4+5=9 adenocarcinoma of the prostate     01/17/2016 Initial Diagnosis    Prostate cancer (HCC)     01/17/2016 Imaging    MRI pelvis: Extension of prostate tumor into mesorectal fascia and seminal vesicles, pelvic lymphadenopathy, +abnormal osseous lesions concerning for metastatic disease       01/26/2016 Imaging    Bone scan: Widespread osseous metastases in axial and appendicular skeleton     01/31/2016 - 03/04/2016 Chemotherapy    Bicalutamide 50 mg PO daily     02/19/2016 - 06/07/2016 Chemotherapy    Docetaxel 75 mg/m2 IV q3 weeks, plan for 6 cycles.  Administered without curative intent.     02/19/2016 -  Chemotherapy    Lupron 22.5 mg IM q3 months      06/09/2017 - 06/08/2018 Chemotherapy    Abiraterone 1000 mg PO daily + Prednisone 5 mg PO BID, given without curative intent (also participating in CHAARTED 2 trial)     06/09/2017 - 10/13/2017 Chemotherapy    Cabazitaxel 25 mg PO IV q21d, given without curative intent as investigational arm of CHAARTED 2 trial     05/21/2018 - 05/27/2018 Radiation    Palliative radiation to L medial iliac bone. Total dose 35 Gy in 5 fractions     06/08/2018 - 03/18/2019 Chemotherapy    OP SUPPORT PROSTATE ENZALUTAMIDE  Plan Provider: Risa Grill, MD  Treatment goal: Palliative      11/30/2018 Progression    Liver biopsy demonstrating progressive prostate adenocarcinoma      12/17/2018 -  Chemotherapy OP GU CARBOPLATIN + CABAZITAXEL + PREDNISONE  Plan Provider: Risa Grill, MD  Treatment goal: Palliative          History of Present Illness  Mr. 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  He began treatment with carboplatin and cabazitaxel 3 weeks ago.      He then presented to the emergency department on December 31, 2018 with complaints of fatigue as well as diarrhea.  He was diagnosed with a GI bleed and was found to have a hemoglobin of 6.2 for which he received a total of 3 units of PRBCs.  He also underwent testing of his diarrhea and was found to have C. difficile so he was started on oral vancomycin for a total of 10 days.  While in the hospital to undergo further work-up with an EGD and colonoscopy which showed no definitive source of bleeding.  The patient states that he has not experienced any further hematochezia or melena since he was discharged home.    His daughter relates that he has improved rather rapidly since he returned  home and has been going up and down the steps in his house.  He is eating well.  He does report more pain in his right hip for which he is using MS Contin each evening as well as hydrocodone?APAP tablet daily.  He is questioning if he can use ibuprofen to help with his hip pain.    The patient and his family are concerned about him receiving additional chemotherapy today.  They also have questions regarding what medications he should be taking at home.       Review of Systems   Constitutional: Negative for activity change, appetite change, chills, fatigue, fever and unexpected weight change.   HENT: Negative for dental problem, hearing loss, mouth sores, nosebleeds, rhinorrhea, sinus pain, sneezing, sore throat, tinnitus, trouble swallowing and voice change.    Eyes: Negative for visual disturbance. Respiratory: Negative for cough, chest tightness, shortness of breath and wheezing.    Cardiovascular: Negative for chest pain, palpitations and leg swelling.   Gastrointestinal: Negative for abdominal pain, blood in stool, constipation, diarrhea, nausea and vomiting.   Genitourinary: Negative for decreased urine volume, difficulty urinating, dysuria, flank pain, frequency, hematuria and urgency.   Musculoskeletal: Positive for arthralgias. Negative for back pain, gait problem and myalgias.   Skin: Negative for rash and wound.   Neurological: Negative for syncope, weakness, light-headedness and headaches.   Psychiatric/Behavioral: Negative for confusion, dysphoric mood and sleep disturbance. The patient is not nervous/anxious.        Objective:         ? albuterol (VENTOLIN HFA, PROAIR HFA) 90 mcg/actuation inhaler Inhale 2 Puffs by mouth every 6 hours as needed.     ? albuterol 0.083% (PROVENTIL; VENTOLIN) 2.5 mg /3 mL (0.083 %) nebulizer solution Inhale 3 mL solution by nebulizer as directed every 4 hours as needed for Wheezing or Shortness of Breath.   ? aspirin EC 81 mg tablet Take 81 mg by mouth at bedtime daily. Take with food.   ? atorvastatin (LIPITOR) 80 mg tablet Take 80 mg by mouth at bedtime daily.   ? carvedilol (COREG) 3.125 mg tablet Take one tablet by mouth twice daily with meals. Take with food.   ? cholecalciferol (VITAMIN D-3) 1,000 units tablet Take 1,000 Units by mouth twice daily.   ? edoxaban (SAVAYSA) 60 mg tab tablet Take one tablet by mouth daily.   ? empagliflozin (JARDIANCE) 10 mg tablet Take one tablet by mouth daily.   ? EPINEPHrine (EPIPEN) 1 mg/mL injection pen (2-Pack) Inject 0.3 mg (1 Pen) into thigh if needed for anaphylactic reaction. May repeat in 5-15 minutes if needed.  Indications: a significant type of allergic reaction called anaphylaxis   ? ferrous sulfate 325 mg (65 mg iron) tablet Take one tablet by mouth twice daily with meals. ? furosemide (LASIX) 40 mg tablet Take one tablet by mouth twice daily. Morning and afternoon   ? gabapentin (NEURONTIN) 600 mg tablet Take one tablet by mouth every 8 hours.   ? HYDROcodone/acetaminophen (NORCO) 10/325 mg tablet Take one tablet by mouth every 6 hours as needed for Pain   ? insulin aspart U-100 (NOVOLOG FLEXPEN) 100 unit/mL (3 mL) injection PEN Inject twelve Units under the skin three times daily with meals.   ? insulin glargine (LANTUS SOLOSTAR) 100 unit/mL (3 mL) injection PEN Inject sixty Units under the skin at bedtime daily.   ? levothyroxine (SYNTHROID) 25 mcg tablet Take 25 mcg by mouth daily. Before breakfast    ? lisinopril (PRINIVIL, ZESTRIL)  2.5 mg tablet Take 1 tablet by mouth daily.   ? metFORMIN (GLUCOPHAGE) 1,000 mg tablet Take 1,000 mg by mouth twice daily with meals.     ? morphine SR (MS CONTIN) 15 mg tablet Take one tablet by mouth at bedtime daily   ? nitroglycerin (NITROSTAT) 0.4 mg tablet Place 0.4 mg under tongue every 5 minutes as needed.     ? omeprazole DR (PRILOSEC) 20 mg capsule Take one capsule by mouth twice daily.   ? ondansetron (ZOFRAN) 8 mg tablet Take one tablet by mouth every 8 hours as needed (nausea and vomiting).   ? predniSONE (DELTASONE) 10 mg tablet Take one tablet by mouth daily with breakfast.   ? prochlorperazine maleate (COMPAZINE) 10 mg tablet Take one tablet by mouth every 6 hours as needed for Nausea or Vomiting.   ? senna (SENOKOT) 8.6 mg tablet Take 2 tablets by mouth at bedtime as needed for Constipation.   ? sertraline (ZOLOFT) 50 mg tablet Take 1 tablet by mouth daily.   ? triamterene-hydrochlorothiazide (DYAZIDE) 37.5-25 mg capsule Take 1 capsule by mouth every morning.   ? vancomycin (VANCOCIN) 125 mg capsule Take one capsule by mouth four times daily for 8 days.     Vitals:    01/07/19 1314   BP: (!) 156/65   BP Source: Arm, Left Upper   Patient Position: Sitting   Pulse: 56   Resp: 16   Temp: 36.4 ?C (97.5 ?F)   TempSrc: Oral   SpO2: 98% Weight: 92.3 kg (203 lb 6.4 oz)   Height: 170.2 cm (67.01)   PainSc: Six     On 3L of O2 per NC    Body mass index is 31.85 kg/m?Marland Kitchen     Pain Score: Six       Fatigue Scale: 4    Pain Addressed:  Patient to call office if pain not relieved or worsened and Current regimen working to control pain.    Patient Evaluated for a Clinical Trial: Patient not eligible for a treatment trial (including not needing treatment, needs palliative care, in remission).     [place] Cooperative Oncology Group performance status is 2, Ambulatory and capable of all selfcare but unable to carry out any work activities. Up and about more than 50% of waking hours.     Physical Exam  Constitutional:       General: He is not in acute distress.     Appearance: Normal appearance. He is well-developed. He is not toxic-appearing or diaphoretic.   HENT:      Head: Normocephalic and atraumatic.      Mouth/Throat:      Mouth: Mucous membranes are moist.      Pharynx: No oropharyngeal exudate or posterior oropharyngeal erythema.   Eyes:      General: No scleral icterus.     Extraocular Movements: Extraocular movements intact.      Pupils: Pupils are equal, round, and reactive to light.   Neck:      Musculoskeletal: Normal range of motion and neck supple.   Cardiovascular:      Rate and Rhythm: Normal rate and regular rhythm.      Heart sounds: Normal heart sounds. No murmur.   Pulmonary:      Effort: Pulmonary effort is normal. No respiratory distress.      Breath sounds: Normal breath sounds. No wheezing, rhonchi or rales.   Abdominal:      General: Bowel sounds are normal. There is no distension.  Palpations: Abdomen is soft. There is no mass.      Tenderness: There is no abdominal tenderness.   Musculoskeletal: Normal range of motion.         General: No swelling.   Lymphadenopathy:      Cervical: No cervical adenopathy.   Skin:     General: Skin is warm and dry.      Findings: No rash.   Neurological:      General: No focal deficit present. Mental Status: He is alert and oriented to person, place, and time. Mental status is at baseline.   Psychiatric:         Mood and Affect: Mood normal.         Behavior: Behavior normal.         Thought Content: Thought content normal.         Judgment: Judgment normal.          Lab Results   Component Value Date/Time    WBC 3.7 (L) 01/07/2019 12:16 PM    ANC 2.70 01/07/2019 12:16 PM    HGB 8.8 (L) 01/07/2019 12:16 PM    HCT 28.1 (L) 01/07/2019 12:16 PM    PLTCT 184 01/07/2019 12:16 PM     Lab Results   Component Value Date/Time    NA 135 (L) 01/07/2019 12:16 PM    K 4.0 01/07/2019 12:16 PM    BUN 24 01/07/2019 12:16 PM    CR 1.20 01/07/2019 12:16 PM    GLU 177 (H) 01/07/2019 12:16 PM    CA 9.2 01/07/2019 12:16 PM    AST 22 01/07/2019 12:16 PM    ALT 17 01/07/2019 12:16 PM    ALKPHOS 198 (H) 01/07/2019 12:16 PM        Assessment and Plan:    Mr. Shames is a 68 y.o.?gentleman with history of coronary artery disease who presents regarding evaluation and management of metastatic, castrate-resistant prostate cancer.  ?  1. 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  -However, since he was last seen, he did have a hospital admission secondary to a GI bleed and diarrhea which was found to be secondary to C. Difficile  -Discussed that his hemoglobin has improved to 8.8 and we will continue to monitor this.  -Encouraged him to complete his course of vancomycin as prescribed.  -We will continue to hold off on chemotherapy today and see him back in 3 weeks with repeat labs at which time we will tentatively plan on resuming chemotherapy with carboplatin and AUC of 4 and cabazitaxel at 20 mg/m?.  -Per [place] policy, he will undergo COVID-19 testing 48 hours prior to chemotherapy.    2. Pain: LLE pain  ?With both mixed nociceptive and neuropathic/radicular components (neuropathic seems more prominent now).  ?Cont Gabapentin to 600 mg PO TID  ?Cont Hydro/APAP 10/325 mg?PRN; he is only using one/day and due to uncontrolled pain, I encouraged him to use this more frequently  -Continue MsContin 15 mg QHS; refill provided today.  -He is to avoid all NSAIDS due to recent GI bleed.  ?  3. Anemia:  ?Continue Ferrous sulfate 325 mg PO q12h,   ?Continue B12 1000 mg IM monthly thereafter, to be given indefinitely.  -Hgb improving post hospital discharge for GI bleed; will clarify how long he should hold edoxaban   ?  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  ?No treatment indicated except for Gabapentin being used for radicular LLE pain.  ?  5. Fatigue:  ?Due to cancer, continue energy conservation  ?  6.?Hypoxia:  ?Continue supplementary oxygen, stable.?  ?  Gabriela Giannelli, PA-C  ?  Supervising physician:  Clent Jacks, MD

## 2019-01-08 ENCOUNTER — Encounter: Admit: 2019-01-08 | Discharge: 2019-01-08 | Payer: MEDICARE

## 2019-01-08 NOTE — Telephone Encounter
Pt called to inform him to hold his blood thinner medicine, edoxaban, for 3 weeks per [facility], until his next appointment with Dr. Dondra Spry.  Spoke with pt's wife, Marta Lamas, she voiced understanding and would make sure this was done.

## 2019-01-14 ENCOUNTER — Encounter: Admit: 2019-01-14 | Discharge: 2019-01-14 | Payer: MEDICARE

## 2019-01-19 ENCOUNTER — Encounter: Admit: 2019-01-19 | Discharge: 2019-01-19 | Payer: MEDICARE

## 2019-01-19 NOTE — Telephone Encounter
SPOKE WITH THE PATIENT AND HE WOULD RATHER DO LABS AND COVID SWAB AT [place] ON Friday 10/9

## 2019-01-22 ENCOUNTER — Encounter: Admit: 2019-01-22 | Discharge: 2019-01-22 | Payer: MEDICARE

## 2019-01-22 ENCOUNTER — Ambulatory Visit: Admit: 2019-01-22 | Discharge: 2019-01-22 | Payer: MEDICARE

## 2019-01-22 ENCOUNTER — Encounter: Admit: 2019-01-22 | Discharge: 2019-01-23 | Payer: MEDICARE

## 2019-01-22 DIAGNOSIS — C61 Malignant neoplasm of prostate: Secondary | ICD-10-CM

## 2019-01-22 DIAGNOSIS — Z1159 Encounter for screening for other viral diseases: Secondary | ICD-10-CM

## 2019-01-22 LAB — CBC AND DIFF
Lab: 1 % (ref >60)
Lab: 13 % — ABNORMAL LOW (ref 24–44)
Lab: 133 K/UL — ABNORMAL LOW (ref 150–400)
Lab: 20 % — ABNORMAL HIGH (ref 11–15)
Lab: 28 pg (ref 26–34)
Lab: 3.1 10*3/uL (ref 1.8–7.0)
Lab: 3.4 M/UL — ABNORMAL LOW (ref 4.4–5.5)
Lab: 31 g/dL — ABNORMAL LOW (ref 32.0–36.0)
Lab: 4.4 K/UL — ABNORMAL LOW (ref 4.5–11.0)
Lab: 7 % (ref 4–12)
Lab: 7 % — ABNORMAL HIGH (ref >60)
Lab: 72 % — ABNORMAL HIGH (ref 41–77)
Lab: 8.5 FL (ref 7–11)
Lab: 89 FL (ref 80–100)
Lab: 9.6 g/dL — ABNORMAL LOW (ref 13.5–16.5)

## 2019-01-22 LAB — COMPREHENSIVE METABOLIC PANEL
Lab: 140 MMOL/L (ref >60)
Lab: 4 MMOL/L (ref 3.5–5.1)

## 2019-01-22 LAB — PROSTATIC SPECIFIC ANTIGEN-PSA: Lab: 3.6 ng/mL (ref <4.01)

## 2019-01-23 ENCOUNTER — Encounter: Admit: 2019-01-23 | Discharge: 2019-01-23 | Payer: MEDICARE

## 2019-01-23 LAB — COVID-19 (SARS-COV-2) PCR

## 2019-01-25 ENCOUNTER — Encounter: Admit: 2019-01-25 | Discharge: 2019-01-25 | Payer: MEDICARE

## 2019-01-25 DIAGNOSIS — C778 Secondary and unspecified malignant neoplasm of lymph nodes of multiple regions: Secondary | ICD-10-CM

## 2019-01-25 DIAGNOSIS — C61 Malignant neoplasm of prostate: Secondary | ICD-10-CM

## 2019-01-25 DIAGNOSIS — Z86718 Personal history of other venous thrombosis and embolism: Secondary | ICD-10-CM

## 2019-01-25 DIAGNOSIS — C7951 Secondary malignant neoplasm of bone: Secondary | ICD-10-CM

## 2019-01-25 DIAGNOSIS — R5383 Other fatigue: Secondary | ICD-10-CM

## 2019-01-25 DIAGNOSIS — G893 Neoplasm related pain (acute) (chronic): Secondary | ICD-10-CM

## 2019-01-25 MED ORDER — HYDROCODONE-ACETAMINOPHEN 10-325 MG PO TAB
1 | ORAL_TABLET | ORAL | 0 refills | 15.00000 days | Status: DC | PRN
Start: 2019-01-25 — End: 2019-03-17

## 2019-01-25 MED ORDER — DEXAMETHASONE IVPB
12 mg | Freq: Once | INTRAVENOUS | 0 refills | Status: CP
Start: 2019-01-25 — End: ?
  Administered 2019-01-25 (×2): 12 mg via INTRAVENOUS

## 2019-01-25 MED ORDER — FAMOTIDINE (PF) 20 MG/2 ML IV SOLN
20 mg | Freq: Once | INTRAVENOUS | 0 refills | Status: CP
Start: 2019-01-25 — End: ?
  Administered 2019-01-25: 15:00:00 20 mg via INTRAVENOUS

## 2019-01-25 MED ORDER — APREPITANT 7.2 MG/ML IV EMUL
130 mg | Freq: Once | INTRAVENOUS | 0 refills | Status: CP
Start: 2019-01-25 — End: ?
  Administered 2019-01-25: 16:00:00 130 mg via INTRAVENOUS

## 2019-01-25 MED ORDER — PALONOSETRON 0.25 MG/5 ML IV SOLN
.25 mg | Freq: Once | INTRAVENOUS | 0 refills | Status: CP
Start: 2019-01-25 — End: ?
  Administered 2019-01-25: 15:00:00 0.25 mg via INTRAVENOUS

## 2019-01-25 MED ORDER — CARBOPLATIN IVPB (BY AUC-SWOG)
272.7 mg | Freq: Once | INTRAVENOUS | 0 refills | Status: CP
Start: 2019-01-25 — End: ?
  Administered 2019-01-25 (×2): 272.7 mg via INTRAVENOUS

## 2019-01-25 MED ORDER — DIPHENHYDRAMINE HCL 50 MG/ML IJ SOLN
25 mg | Freq: Once | INTRAVENOUS | 0 refills | Status: CP
Start: 2019-01-25 — End: ?
  Administered 2019-01-25: 15:00:00 25 mg via INTRAVENOUS

## 2019-01-25 MED ORDER — CABAZITAXEL IVPB
20 mg/m2 | Freq: Once | INTRAVENOUS | 0 refills | Status: CP
Start: 2019-01-25 — End: ?
  Administered 2019-01-25 (×2): 42.2 mg via INTRAVENOUS

## 2019-01-25 NOTE — Progress Notes
Name: Alexander Holland          MRN: [id number]      DOB: 08/22/1950      AGE: 68 y.o.   DATE OF SERVICE: 01/25/2019    Subjective:             Reason for Visit:  Heme/Onc Care         Cancer Staging  Prostate cancer [facility])  Staging form: Prostate, AJCC 7th Edition  - Clinical stage from 01/31/2016: Stage IV (T3b, N1, M1b, PSA: Less than 10, Gleason 8-10) - Signed by Ross Marcus, MD on 01/31/2016      Onc Timeline   Prostate cancer (HCC)   01/05/2016 Surgery    Transurethral resection of prostate: Pathology revealed Gleason 4+5=9 adenocarcinoma of the prostate     01/17/2016 Initial Diagnosis    Prostate cancer (HCC)     01/17/2016 Imaging    MRI pelvis: Extension of prostate tumor into mesorectal fascia and seminal vesicles, pelvic lymphadenopathy, +abnormal osseous lesions concerning for metastatic disease       01/26/2016 Imaging    Bone scan: Widespread osseous metastases in axial and appendicular skeleton     01/31/2016 - 03/04/2016 Chemotherapy    Bicalutamide 50 mg PO daily     02/19/2016 - 06/07/2016 Chemotherapy    Docetaxel 75 mg/m2 IV q3 weeks, plan for 6 cycles.  Administered without curative intent.     02/19/2016 -  Chemotherapy    Lupron 22.5 mg IM q3 months      06/09/2017 - 06/08/2018 Chemotherapy    Abiraterone 1000 mg PO daily + Prednisone 5 mg PO BID, given without curative intent (also participating in CHAARTED 2 trial)     06/09/2017 - 10/13/2017 Chemotherapy    Cabazitaxel 25 mg PO IV q21d, given without curative intent as investigational arm of CHAARTED 2 trial     05/21/2018 - 05/27/2018 Radiation    Palliative radiation to L medial iliac bone. Total dose 35 Gy in 5 fractions     06/08/2018 - 03/18/2019 Chemotherapy    OP SUPPORT PROSTATE ENZALUTAMIDE  Plan Provider: Risa Grill, MD  Treatment goal: Palliative      11/30/2018 Progression    Liver biopsy demonstrating progressive prostate adenocarcinoma      12/17/2018 -  Chemotherapy    OP GU CARBOPLATIN + CABAZITAXEL + PREDNISONE Plan Provider: Risa Grill, MD  Treatment goal: Palliative          History of Present Illness  Alexander Holland is a pleasant 68 year old gentleman who presents today for ongoing evaluation of his castrate resistant metastatic prostate cancer.  He began treatment with carboplatin and cabazitaxel on 12/17/2018. He was admitted from 9/17 to 01/03/2019 with fatigue and diarrhea, and was found to have GI bleeding, presumably as a result of C. diff colitis. While in the hospital to undergo further work-up with an EGD and colonoscopy which showed no definitive source of bleeding. The patient states that he has not experienced any further hematochezia or melena since he was discharged home.    Since his discharge, he has had a continued upward recovery. He and his wife report that he is increasingly active at home, and in the yard, and has been getting around at home without any trouble, including going up and down stairs. He also has been driving himself around for chores including groceries. He denies diarrhea/nausea/vomiting.     He denies any other new or concerning symptoms, specifically fevers/chills/new urinary symptoms. His pain  is well controlled, although he does take hydrocodone/aceteminophen 3-4 times per day and long acting morphine nightly. Denies worsening of mild paresthesias in hands and feet.    Denies any other new or concerning symptoms. Specifically, he denies any dyspnea, cough, fever, or rash.        Review of Systems   Constitutional: Negative for activity change, appetite change, chills, fatigue, fever and unexpected weight change.   HENT: Negative for dental problem, hearing loss, mouth sores, nosebleeds, rhinorrhea, sinus pain, sneezing, sore throat, tinnitus, trouble swallowing and voice change.    Eyes: Negative for visual disturbance.   Respiratory: Negative for cough, chest tightness, shortness of breath and wheezing. Cardiovascular: Negative for chest pain, palpitations and leg swelling.   Gastrointestinal: Negative for abdominal pain, blood in stool, constipation, diarrhea, nausea and vomiting.   Genitourinary: Negative for decreased urine volume, difficulty urinating, dysuria, flank pain, frequency, hematuria and urgency.   Musculoskeletal: Positive for arthralgias. Negative for back pain, gait problem and myalgias.   Skin: Negative for rash and wound.   Neurological: Negative for syncope, weakness, light-headedness and headaches.   Psychiatric/Behavioral: Negative for confusion, dysphoric mood and sleep disturbance. The patient is not nervous/anxious.      Past medical, surgical, family, and social history have been reviewed with the patient on 01/25/2019 and confirmed accuracy of the information outlined below:  Medical History:   Diagnosis Date   ? Anemia    ? Arthritis     DJD-Hip   ? Asthma    ? CAD (coronary artery disease)    ? CHF (congestive heart failure) (HCC) 08/2015   ? Chronic renal insufficiency    ? COPD (chronic obstructive pulmonary disease) (HCC)    ? DM (diabetes mellitus) (HCC)     120's,130's   ? DOE (dyspnea on exertion)    ? Ex-smoker    ? GERD (gastroesophageal reflux disease)    ? HLD (hyperlipidemia)    ? HTN (hypertension)    ? Hypothyroidism    ? MRSA (methicillin resistant Staphylococcus aureus)    ? NSTEMI (non-ST elevated myocardial infarction) (HCC)     1997, 2012   ? Obesity    ? OSA on CPAP    ? Postherpetic neuralgia    ? Prostate cancer [facility])      Surgical History:   Procedure Laterality Date   ? CORONARY STENT PLACEMENT  1997   ? CARDIAC SURGERY  2012    CABG   ? CYSTOSCOPY WITH TRANSURETHRAL PROSTATECTOMY N/A 01/05/2016    Performed by Mittie Bodo, MD at [facility] OR   ? TUNNELED VENOUS PORT PLACEMENT Right 06/06/2017    right EJ port placement per IR   ? ESOPHAGOGASTRODUODENOSCOPY WITH BIOPSY - FLEXIBLE N/A 01/01/2019    Performed by Lenor Derrick, MD at [facility] ENDO ? COLONOSCOPY WITH BIOPSY - FLEXIBLE N/A 01/01/2019    Performed by Lenor Derrick, MD at [facility] ENDO   ? ABDOMEN SURGERY      Umbilical Hernia   ? HX HEART CATHETERIZATION     ? LEFT HEART CATHETERIZATION      s/p PCI ~1995     Family History   Problem Relation Age of Onset   ? Kidney Failure Sister    ? Tuberculosis Paternal Grandmother    ? Alcohol abuse Paternal Grandfather    ? Cancer Other 55        type unknown     Social History     Socioeconomic History   ? Marital  status: Married     Spouse name: Not on file   ? Number of children: Not on file   ? Years of education: Not on file   ? Highest education level: Not on file   Occupational History   ? Not on file   Tobacco Use   ? Smoking status: Former Smoker     Packs/day: 1.00     Years: 30.00     Pack years: 30.00     Types: Cigarettes     Last attempt to quit: 02/01/1996     Years since quitting: 22.9   ? Smokeless tobacco: Never Used   Substance and Sexual Activity   ? Alcohol use: Yes     Comment: rarely   ? Drug use: No   ? Sexual activity: Not on file   Other Topics Concern   ? Not on file   Social History Narrative   ? Not on file         Objective:         ? albuterol (VENTOLIN HFA, PROAIR HFA) 90 mcg/actuation inhaler Inhale 2 Puffs by mouth every 6 hours as needed.     ? albuterol 0.083% (PROVENTIL; VENTOLIN) 2.5 mg /3 mL (0.083 %) nebulizer solution Inhale 3 mL solution by nebulizer as directed every 4 hours as needed for Wheezing or Shortness of Breath.   ? aspirin EC 81 mg tablet Take 81 mg by mouth at bedtime daily. Take with food.   ? atorvastatin (LIPITOR) 80 mg tablet Take 80 mg by mouth at bedtime daily.   ? carvedilol (COREG) 3.125 mg tablet Take one tablet by mouth twice daily with meals. Take with food.   ? cholecalciferol (VITAMIN D-3) 1,000 units tablet Take 1,000 Units by mouth twice daily.   ? edoxaban (SAVAYSA) 60 mg tab tablet Take one tablet by mouth daily.   ? empagliflozin (JARDIANCE) 10 mg tablet Take one tablet by mouth daily. ? EPINEPHrine (EPIPEN) 1 mg/mL injection pen (2-Pack) Inject 0.3 mg (1 Pen) into thigh if needed for anaphylactic reaction. May repeat in 5-15 minutes if needed.  Indications: a significant type of allergic reaction called anaphylaxis   ? ferrous sulfate 325 mg (65 mg iron) tablet Take one tablet by mouth twice daily with meals.   ? furosemide (LASIX) 40 mg tablet Take one tablet by mouth twice daily. Morning and afternoon   ? gabapentin (NEURONTIN) 600 mg tablet Take one tablet by mouth every 8 hours.   ? HYDROcodone/acetaminophen (NORCO) 10/325 mg tablet Take one tablet by mouth every 6 hours as needed for Pain   ? insulin aspart U-100 (NOVOLOG FLEXPEN) 100 unit/mL (3 mL) injection PEN Inject twelve Units under the skin three times daily with meals.   ? insulin glargine (LANTUS SOLOSTAR) 100 unit/mL (3 mL) injection PEN Inject sixty Units under the skin at bedtime daily.   ? levothyroxine (SYNTHROID) 25 mcg tablet Take 25 mcg by mouth daily. Before breakfast    ? lisinopril (PRINIVIL, ZESTRIL) 2.5 mg tablet Take 1 tablet by mouth daily.   ? metFORMIN (GLUCOPHAGE) 1,000 mg tablet Take 1,000 mg by mouth twice daily with meals.     ? morphine SR (MS CONTIN) 15 mg tablet Take one tablet by mouth at bedtime daily   ? nitroglycerin (NITROSTAT) 0.4 mg tablet Place 0.4 mg under tongue every 5 minutes as needed.     ? omeprazole DR (PRILOSEC) 20 mg capsule Take one capsule by mouth twice daily.   ?  ondansetron (ZOFRAN) 8 mg tablet Take one tablet by mouth every 8 hours as needed (nausea and vomiting).   ? predniSONE (DELTASONE) 10 mg tablet Take one tablet by mouth daily with breakfast.   ? prochlorperazine maleate (COMPAZINE) 10 mg tablet Take one tablet by mouth every 6 hours as needed for Nausea or Vomiting.   ? senna (SENOKOT) 8.6 mg tablet Take 2 tablets by mouth at bedtime as needed for Constipation.   ? sertraline (ZOLOFT) 50 mg tablet Take 1 tablet by mouth daily. ? triamterene-hydrochlorothiazide (DYAZIDE) 37.5-25 mg capsule Take 1 capsule by mouth every morning.     Vitals:    01/25/19 0816 01/25/19 0820   BP: (P) 137/55    BP Source: Arm, Left Upper    Patient Position: (P) Sitting    Pulse: (P) 81    Resp: (P) 16    Temp: (P) 36.6 ?C (97.9 ?F)    TempSrc: Oral    SpO2: (P) 100%    Weight: (P) 96 kg (211 lb 9.6 oz)    Height: (P) 170.2 cm (67.01)    PainSc:  Four     On 3L of O2 per NC    Body mass index is 33.13 kg/m? (pended).     Pain Score: Four  Pain Loc: Shoulder    Fatigue Scale: 5  Pain Addressed:  Patient to call office if pain not relieved or worsened and Current regimen working to control pain.    Patient Evaluated for a Clinical Trial: Patient not eligible for a treatment trial (including not needing treatment, needs palliative care, in remission).     [place] Cooperative Oncology Group performance status is 2, Ambulatory and capable of all selfcare but unable to carry out any work activities. Up and about more than 50% of waking hours.     Physical Exam  Constitutional:       General: He is not in acute distress.     Appearance: Normal appearance. He is well-developed. He is not toxic-appearing or diaphoretic.   HENT:      Head: Normocephalic and atraumatic.      Mouth/Throat:      Mouth: Mucous membranes are moist.      Pharynx: No oropharyngeal exudate or posterior oropharyngeal erythema.   Eyes:      General: No scleral icterus.     Extraocular Movements: Extraocular movements intact.      Pupils: Pupils are equal, round, and reactive to light.   Neck:      Musculoskeletal: Normal range of motion and neck supple.   Cardiovascular:      Rate and Rhythm: Normal rate and regular rhythm.      Heart sounds: Normal heart sounds. No murmur.   Pulmonary:      Effort: Pulmonary effort is normal. No respiratory distress.      Breath sounds: Normal breath sounds. No wheezing, rhonchi or rales.      Comments: Wearing supplementary O2 by NC  Abdominal: General: Bowel sounds are normal. There is no distension.      Palpations: Abdomen is soft. There is no mass.      Tenderness: There is no abdominal tenderness.   Musculoskeletal: Normal range of motion.         General: No swelling.   Lymphadenopathy:      Cervical: No cervical adenopathy.   Skin:     General: Skin is warm and dry.      Findings: No rash.   Neurological:  General: No focal deficit present.      Mental Status: He is alert and oriented to person, place, and time. Mental status is at baseline.   Psychiatric:         Mood and Affect: Mood normal.         Behavior: Behavior normal.         Thought Content: Thought content normal.         Judgment: Judgment normal.       Comprehensive Metabolic Profile  CMP Latest Ref Rng & Units 01/22/2019 01/07/2019 01/03/2019 01/02/2019 01/01/2019   NA 137 - 147 MMOL/L 140 135(L) 137 138 137   K 3.5 - 5.1 MMOL/L 4.0 4.0 3.8 3.9 3.7   CL 98 - 110 MMOL/L 98 97(L) 104 105 100   CO2 21 - 30 MMOL/L 32(H) 31(H) 28 24 27    GAP 3 - 12 10 7 5 9 10    BUN 7 - 25 MG/DL 22 24 19 15 12    CR 0.4 - 1.24 MG/DL 1.61(W) 9.60 4.54 0.98 1.10   GLUX 70 - 100 MG/DL 89 119(J) 478(G) 956(O) 124(H)   CA 8.5 - 10.6 MG/DL 9.8 9.2 1.3(Y) 8.3(L) 8.5   TP 6.0 - 8.0 G/DL 7.0 6.8 8.6(V) 7.8(I) 6.0   ALB 3.5 - 5.0 G/DL 3.8 3.9 6.9(G) 3.1(L) 3.5   ALKP 25 - 110 U/L 208(H) 198(H) 147(H) 144(H) 159(H)   ALT 7 - 56 U/L 21 17 14 9 12    TBILI 0.3 - 1.2 MG/DL 0.6 0.6 0.7 0.7 1.2   GFR >60 mL/min 50(L) >60 >60 >60 >60   GFRAA >60 mL/min >60 >60 >60 >60 >60            Assessment and Plan: Alexander Holland is a 68 y.o. gentleman with history of coronary artery disease who presents regarding evaluation and management of metastatic, castrate-resistant prostate cancer.  ?  1. 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 -He had a GI bleed and was hospitalized from 9/17 to 01/03/2019 but has recovered well since then, and is eager to proceed with chemotherapy today.   - We discussed that his PSA increased slightly from 3.47 on 12/07/2018 to 3.67 on 01/22/2019, and although it is too early to conclude that the treatment is not working, this will need to be followed up closely with imaging in the near future.   - Will proceed with Cycle 2 of chemotherapy today, all labs and symptoms reviewed and appropriate to proceed with treatment.   - Discussed during the visit today that his slight rise in PSA after cycle 1 is not what we would like to see, but that it is too soon to determine whether he has experienced clinical benefit. Will continue to check PSA with each cycle and will repeat imaging after cycle 3 or 4, depending on PSA trajectory. Alexander Holland is aware that we will discontinue chemotherapy if he has progressive disease while on this treatment, in which case we would evaluate for clinical trial options and weigh these against a best supportive care approach.     2. Pain: LLE pain  ?With both mixed nociceptive and neuropathic/radicular components  ?Cont Gabapentin to 600 mg PO TID  ?Cont Hydro/APAP 10/325 mg?PRN; he is only using 3-4 per day and his pain is well controlled, medication refilled  -Continue MsContin 15 mg QHS  -He is  to avoid all NSAIDS due to recent GI bleed.  ?  3. Anemia:  ?Continue Ferrous sulfate 325 mg PO q12h,   ?Continue B12 1000 mg IM monthly thereafter, to be given indefinitely.  -Hgb improving post hospital discharge for GI bleed; will clarify how long he should hold edoxaban   ?  4. 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 Currently stable  ?No treatment indicated except for Gabapentin being used for radicular LLE pain.  ?  5. Fatigue:  ?Due to cancer, continue energy conservation  ?  6.?Hypoxia:  ?Continue supplementary oxygen, stable.?    7. History of DVT: - We had discontinued edoxaban due to recent GI bleed, given resolution of bleeding, patient asked to resume edoxaban.   ?  Ghulam Rehman Mohyuddin  PGY6 Hematology-Oncology Fellow     Patient seen and discussed with Dr Lura Em.     ATTESTATION    I personally performed the key portions of the E/M visit, discussed case with resident and concur with resident documentation of history, physical exam, assessment, and treatment plan unless otherwise noted.    My additions are in italics above.     Staff name:  Risa Grill, MD Date:  01/25/2019                  Total face-to-face time for visit: 18 minutes, all (> 50%) of which was spent in education and counseling with patient and wife in clinic. Discussed the patient's cancer diagnosis, management, anemia, cancer pain, DVT, fatigue, neuropathy, and reviewed goals.

## 2019-01-25 NOTE — Progress Notes
CHEMO NOTE  Verified chemo consent signed and in chart.    Verified initiate chemo order in O2    Blood return positive via: Peripheral (24 ga)    BSA and dose double checked (agree with orders as written) with: yes    Labs/applicable tests checked: CBC and Comprehensive Metabolic Panel (CMP); okay to use labs from 10/9 per Dr. Dondra Spry.    Chemo regime: Drug/cycle/day: Elicia Lamp    cabazitaxeL (JEVTANA) 42.2 mg in sodium chloride 0.9% (NS) 254.22 mL IVPB(non-PVC)     CARBOplatin (PARAPLATIN) 272.7 mg in sodium chloride 0.9% (NS) 277.27 mL IVPB (AUC-SWOG)    Rate verified and armband double checkwith second RN: yes    Patient education offered and stated understanding. Denies questions at this time.    Patient arrived to CC treatment after being seen in clinic by Dr. Dondra Spry; please refer to clinic note for assessment details. Patient met all parameters for treatment. Premeds given per treatment plan. Cabazitaxel and Carboplatin given w/o complications, patient tolerated well. PIV positive for blood return and removed. Patient offered copy of labs and AVS, denies need at this time. All questions and concerns addressed. Patient left CC treatment w/ all personal belongings in stable condition.

## 2019-01-26 ENCOUNTER — Encounter: Admit: 2019-01-26 | Discharge: 2019-01-26 | Payer: MEDICARE

## 2019-01-26 DIAGNOSIS — C61 Malignant neoplasm of prostate: Secondary | ICD-10-CM

## 2019-01-26 MED ORDER — PEGFILGRASTIM-CBQV 6 MG/0.6 ML SC SYRG
6 mg | Freq: Once | SUBCUTANEOUS | 0 refills | Status: CP
Start: 2019-01-26 — End: ?
  Administered 2019-01-26: 16:00:00 6 mg via SUBCUTANEOUS

## 2019-01-26 NOTE — Progress Notes
Patient here today for treatment of Udenyca. Only complaint today is some increased fatigue. SQ injection of Udenyca given to left arm, patient tolerated well. To return as scheduled.

## 2019-01-26 NOTE — Patient Instructions
FATIGUE  Fatigue is when a person has less energy to do the things he or she normally does, or wants to do. Fatigue is the most common side effect of cancer treatment. This fatigue is different from that of everyday life. Fatigue related to cancer treatment can appear suddenly and can be overwhelming. It is not relieved by rest. It can last for months after treatment ends. This type of fatigue can affect many aspects of a person?s life, including the ability to do his usual activities.   Cancer fatigue is real and should not be ignored. It can be worse when a person is dehydrated, anemic, in pain, not sleeping well, or has an infection. Recent studies have shown that exercise programs during treatment can help reduce fatigue.   What to Look For   ? Feeling like you have no energy   ? Sleeping more than normal   ? Not wanting to or not being able to do normal activities   ? Less attention to personal appearance   ? Feeling tired even after sleeping   ? Trouble thinking or concentrating   ? Trouble finding words and speaking   What the Patient Can Do   ? Balance rest and activities.   ? Tell the doctor if you?re not able to get around as well as usual.   ? Plan your important activities for when you have the most energy.   ? Schedule important activities throughout the day rather than all at once.   ? Get enough rest and sleep. Short naps and rest breaks may be needed.   ? Remember that fatigue caused by treatment is short-term and that energy will slowly get better after treatment has ended.   ? Ask others to help you by cooking meals and doing housework, yard work, and errands.   ? Eat a balanced diet that includes protein (meat, eggs, cheese, and legumes such as peas and beans) and drink about 8 to 10 glasses of water a day, unless your care team gives you other instructions.   What Caregivers Can Do   ? Help schedule friends and family members to prepare meals, clean house, do yard work, or run errands for the patient.   ? Try not to push the patient to do more than he or she is able.   ? Help the patient to set up a routine for activities during the day.   Call the doctor if the patient:   ? is too tired to get out of bed for more than a 24-hour period   ? becomes confused   ? has fatigue that keeps getting worse   ? feels out of breath or has a racing heart after just a little activity  [employer] www.cancer.org

## 2019-02-05 IMAGING — CR CHEST
2 series · 2 of 2 positions shown · non-contrast
Comparison: none

[chest pa x-wise]
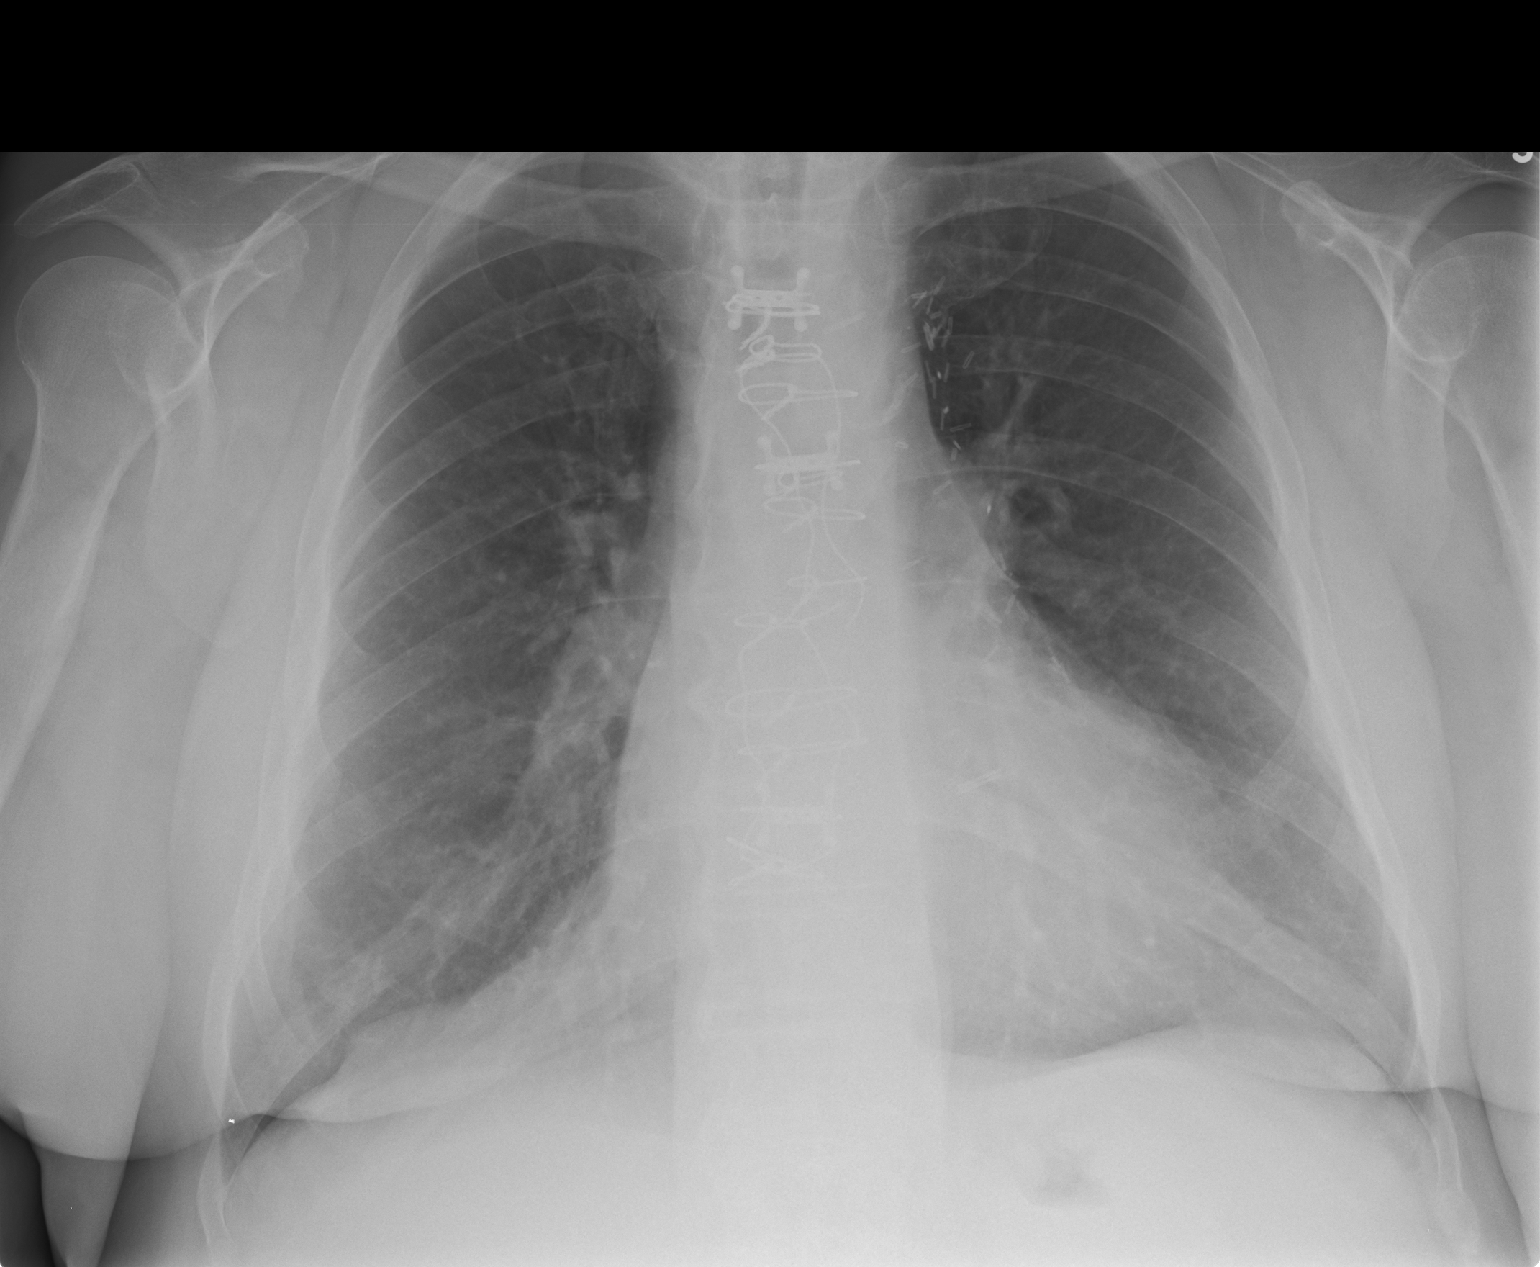

[chest lat]
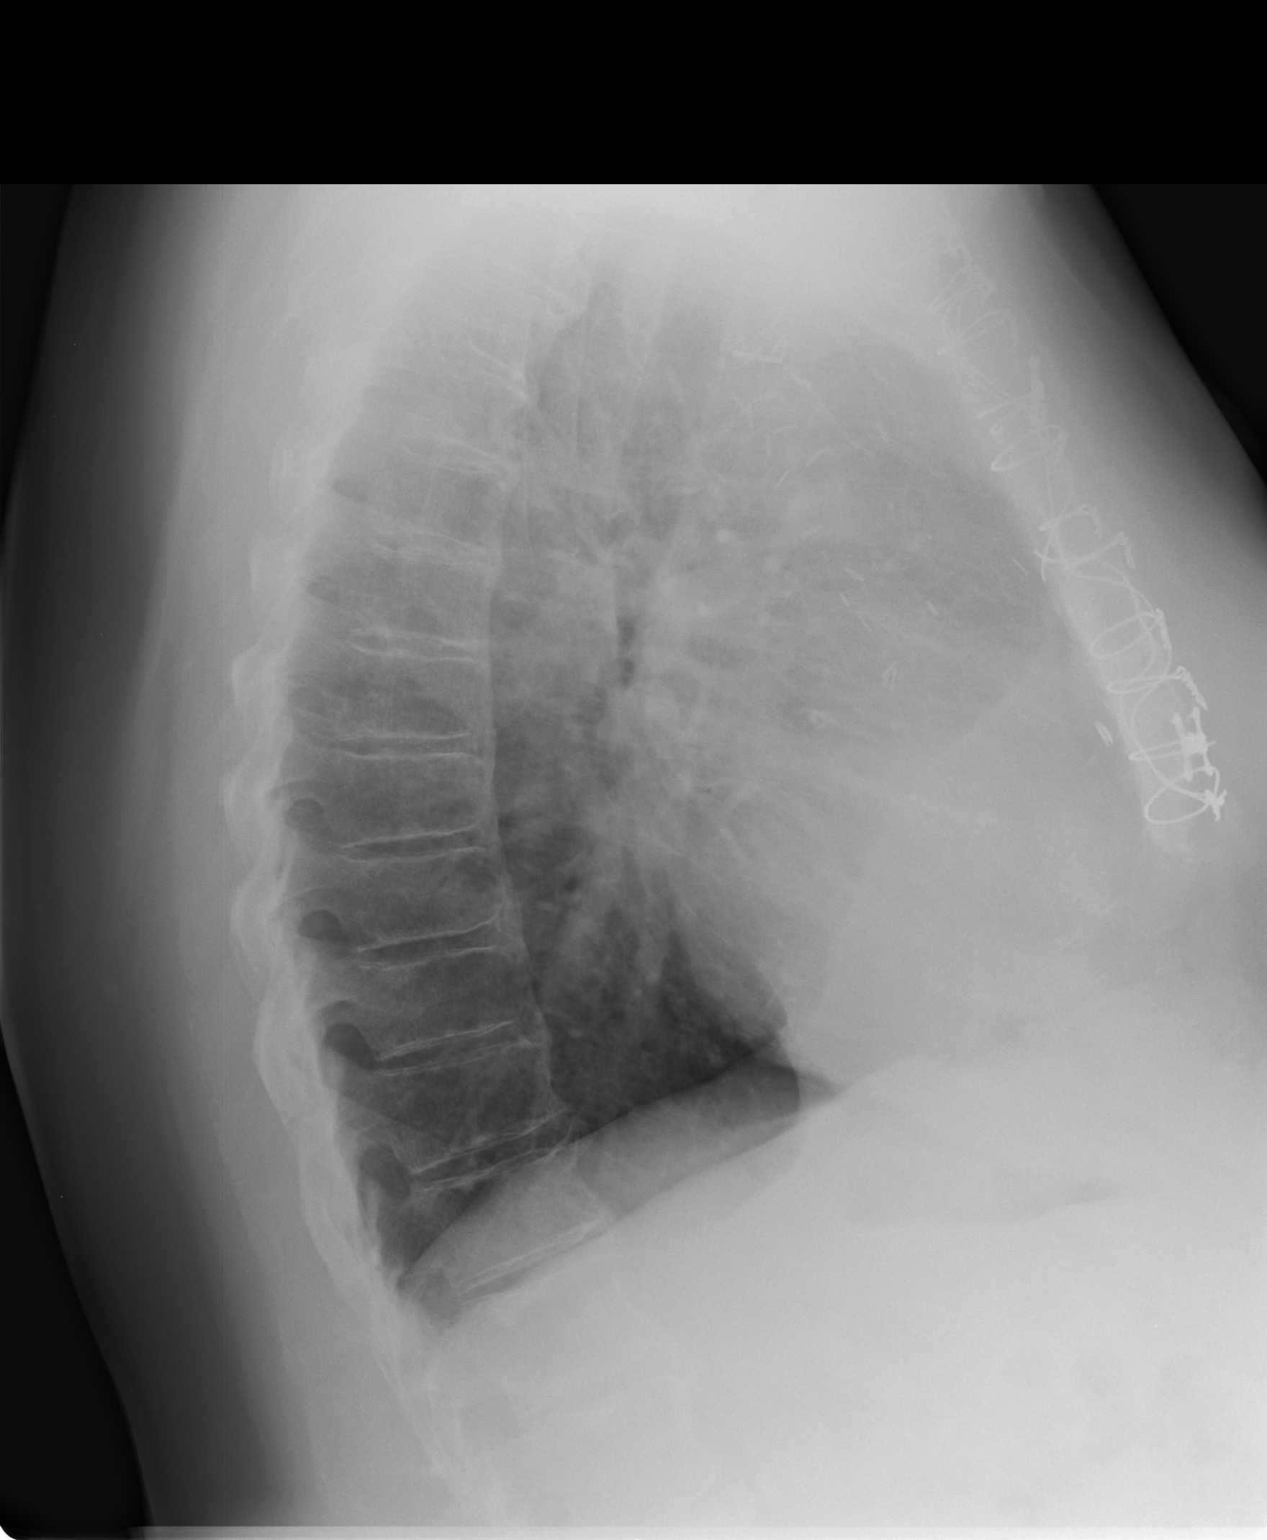

[2 of 2 positions shown; findings below may reference images not displayed]

EXAM

XR chest 2V

INDICATION

Wheezing

TECHNIQUE

PA and Lateral views of the chest

COMPARISONS

10/22/2017

FINDINGS

No radiographically apparent pleural effusion, consolidation, or pneumothorax.

The cardiomediastinal silhouette is normal in size.

The osseous structures are without an acute osseous abnormality. There are unchanged postoperative
changes to the chest.

IMPRESSION
- No radiographic evidence of an acute cardiopulmonary process.

Tech Notes:

Pt c/o cough and SOA x 3 days. HX bypass surgery in 9148. JC/CK

## 2019-02-09 ENCOUNTER — Encounter: Admit: 2019-02-09 | Discharge: 2019-02-09 | Payer: MEDICARE

## 2019-02-10 ENCOUNTER — Encounter: Admit: 2019-02-10 | Discharge: 2019-02-10 | Payer: MEDICARE

## 2019-02-15 ENCOUNTER — Encounter: Admit: 2019-02-15 | Discharge: 2019-02-15 | Payer: MEDICARE

## 2019-02-15 DIAGNOSIS — E039 Hypothyroidism, unspecified: Secondary | ICD-10-CM

## 2019-02-15 DIAGNOSIS — M1990 Unspecified osteoarthritis, unspecified site: Secondary | ICD-10-CM

## 2019-02-15 DIAGNOSIS — G4733 Obstructive sleep apnea (adult) (pediatric): Secondary | ICD-10-CM

## 2019-02-15 DIAGNOSIS — C61 Malignant neoplasm of prostate: Secondary | ICD-10-CM

## 2019-02-15 DIAGNOSIS — A4902 Methicillin resistant Staphylococcus aureus infection, unspecified site: Secondary | ICD-10-CM

## 2019-02-15 DIAGNOSIS — E669 Obesity, unspecified: Secondary | ICD-10-CM

## 2019-02-15 DIAGNOSIS — B0229 Other postherpetic nervous system involvement: Secondary | ICD-10-CM

## 2019-02-15 DIAGNOSIS — M5416 Radiculopathy, lumbar region: Secondary | ICD-10-CM

## 2019-02-15 DIAGNOSIS — E119 Type 2 diabetes mellitus without complications: Secondary | ICD-10-CM

## 2019-02-15 DIAGNOSIS — J45909 Unspecified asthma, uncomplicated: Secondary | ICD-10-CM

## 2019-02-15 DIAGNOSIS — I2510 Atherosclerotic heart disease of native coronary artery without angina pectoris: Secondary | ICD-10-CM

## 2019-02-15 DIAGNOSIS — N189 Chronic kidney disease, unspecified: Secondary | ICD-10-CM

## 2019-02-15 DIAGNOSIS — E785 Hyperlipidemia, unspecified: Secondary | ICD-10-CM

## 2019-02-15 DIAGNOSIS — Z87891 Personal history of nicotine dependence: Secondary | ICD-10-CM

## 2019-02-15 DIAGNOSIS — I10 Essential (primary) hypertension: Secondary | ICD-10-CM

## 2019-02-15 DIAGNOSIS — D649 Anemia, unspecified: Secondary | ICD-10-CM

## 2019-02-15 DIAGNOSIS — I509 Heart failure, unspecified: Secondary | ICD-10-CM

## 2019-02-15 DIAGNOSIS — J449 Chronic obstructive pulmonary disease, unspecified: Secondary | ICD-10-CM

## 2019-02-15 DIAGNOSIS — R0600 Dyspnea, unspecified: Secondary | ICD-10-CM

## 2019-02-15 DIAGNOSIS — I214 Non-ST elevation (NSTEMI) myocardial infarction: Secondary | ICD-10-CM

## 2019-02-15 DIAGNOSIS — K219 Gastro-esophageal reflux disease without esophagitis: Secondary | ICD-10-CM

## 2019-02-15 LAB — PROSTATIC SPECIFIC ANTIGEN-PSA: Lab: 1.8 ng/mL (ref <4.01)

## 2019-02-15 MED ORDER — CABAZITAXEL IVPB
20 mg/m2 | Freq: Once | INTRAVENOUS | 0 refills | Status: CN
Start: 2019-02-15 — End: ?

## 2019-02-15 MED ORDER — DEXAMETHASONE IVPB
12 mg | Freq: Once | INTRAVENOUS | 0 refills | Status: CP
Start: 2019-02-15 — End: ?
  Administered 2019-02-15 (×2): 12 mg via INTRAVENOUS

## 2019-02-15 MED ORDER — DEXAMETHASONE IVPB
12 mg | Freq: Once | INTRAVENOUS | 0 refills | Status: CN
Start: 2019-02-15 — End: ?

## 2019-02-15 MED ORDER — APREPITANT 7.2 MG/ML IV EMUL
130 mg | Freq: Once | INTRAVENOUS | 0 refills | Status: CN
Start: 2019-02-15 — End: ?

## 2019-02-15 MED ORDER — DIPHENHYDRAMINE HCL 50 MG/ML IJ SOLN
25 mg | Freq: Once | INTRAVENOUS | 0 refills | Status: CP
Start: 2019-02-15 — End: ?
  Administered 2019-02-15: 18:00:00 25 mg via INTRAVENOUS

## 2019-02-15 MED ORDER — FAMOTIDINE (PF) 20 MG/2 ML IV SOLN
20 mg | Freq: Once | INTRAVENOUS | 0 refills | Status: CP
Start: 2019-02-15 — End: ?
  Administered 2019-02-15: 18:00:00 20 mg via INTRAVENOUS

## 2019-02-15 MED ORDER — APREPITANT 7.2 MG/ML IV EMUL
130 mg | Freq: Once | INTRAVENOUS | 0 refills | Status: CP
Start: 2019-02-15 — End: ?
  Administered 2019-02-15: 18:00:00 130 mg via INTRAVENOUS

## 2019-02-15 MED ORDER — DIPHENHYDRAMINE HCL 50 MG/ML IJ SOLN
25 mg | Freq: Once | INTRAVENOUS | 0 refills | Status: CN
Start: 2019-02-15 — End: ?

## 2019-02-15 MED ORDER — PALONOSETRON 0.25 MG/5 ML IV SOLN
.25 mg | Freq: Once | INTRAVENOUS | 0 refills | Status: CP
Start: 2019-02-15 — End: ?
  Administered 2019-02-15: 18:00:00 0.25 mg via INTRAVENOUS

## 2019-02-15 MED ORDER — CARBOPLATIN IVPB (BY AUC-SWOG)
280.5 mg | Freq: Once | INTRAVENOUS | 0 refills | Status: CP
Start: 2019-02-15 — End: ?
  Administered 2019-02-15 (×2): 280.5 mg via INTRAVENOUS

## 2019-02-15 MED ORDER — CABAZITAXEL IVPB
20 mg/m2 | Freq: Once | INTRAVENOUS | 0 refills | Status: CP
Start: 2019-02-15 — End: ?
  Administered 2019-02-15 (×2): 42.2 mg via INTRAVENOUS

## 2019-02-15 MED ORDER — GABAPENTIN 600 MG PO TAB
600 mg | ORAL_TABLET | ORAL | 1 refills | Status: AC
Start: 2019-02-15 — End: ?

## 2019-02-15 MED ORDER — FAMOTIDINE (PF) 20 MG/2 ML IV SOLN
20 mg | Freq: Once | INTRAVENOUS | 0 refills | Status: CN
Start: 2019-02-15 — End: ?

## 2019-02-15 MED ORDER — PALONOSETRON 0.25 MG/5 ML IV SOLN
.25 mg | Freq: Once | INTRAVENOUS | 0 refills | Status: CN
Start: 2019-02-15 — End: ?

## 2019-02-15 NOTE — Progress Notes
CHEMO NOTE  Verified chemo consent signed and in chart.    Verified initiate chemo order in O2    Blood return positive via: Peripheral (22 ga)    BSA and dose double checked (agree with orders as written) with: yes see MAR    Labs/applicable tests checked: CBC and Comprehensive Metabolic Panel (CMP)    Chemo regime: Drug/cycle/day  C3D1  CARBOplatin (PARAPLATIN) 280.5 mg in sodium chloride 0.9% (NS) 278.05 mL IVPB   cabazitaxeL (JEVTANA) 42.2 mg in sodium chloride 0.9% (NS) 254.22 mL IVPB       Rate verified and armband double checkwith second RN: yes    Patient education offered and stated understanding. Denies questions at this time.      Pt here for carboplatin and jevtana.  Pt seen in clinic by Dr. Milas Kocher.  Has no questions or complaints.  Treatment given without incident.  PIV flushed with saline and dcd.  Pt left floor ambulatory, with all belongings.

## 2019-02-15 NOTE — Progress Notes
Name: Alexander Holland          MRN: [id number]      DOB: [birth year]      AGE: 68 y.o.   DATE OF SERVICE: 02/15/2019    Subjective:             Reason for Visit:  Follow Up      Alexander Holland is a 68 y.o. male.     Cancer Staging  Prostate cancer [facility])  Staging form: Prostate, AJCC 7th Edition  - Clinical stage from 01/31/2016: Stage IV (T3b, N1, M1b, PSA: Less than 10, Gleason 8-10) - Signed by Ross Marcus, MD on 01/31/2016    Onc Timeline   Prostate cancer (HCC)   01/05/2016 Surgery    Transurethral resection of prostate: Pathology revealed Gleason 4+5=9 adenocarcinoma of the prostate     01/17/2016 Initial Diagnosis    Prostate cancer (HCC)     01/17/2016 Imaging    MRI pelvis: Extension of prostate tumor into mesorectal fascia and seminal vesicles, pelvic lymphadenopathy, +abnormal osseous lesions concerning for metastatic disease       01/26/2016 Imaging    Bone scan: Widespread osseous metastases in axial and appendicular skeleton     01/31/2016 - 03/04/2016 Chemotherapy    Bicalutamide 50 mg PO daily     02/19/2016 - 06/07/2016 Chemotherapy    Docetaxel 75 mg/m2 IV q3 weeks, plan for 6 cycles.  Administered without curative intent.     02/19/2016 -  Chemotherapy    Lupron 22.5 mg IM q3 months      06/09/2017 - 06/08/2018 Chemotherapy    Abiraterone 1000 mg PO daily + Prednisone 5 mg PO BID, given without curative intent (also participating in CHAARTED 2 trial)     06/09/2017 - 10/13/2017 Chemotherapy    Cabazitaxel 25 mg PO IV q21d, given without curative intent as investigational arm of CHAARTED 2 trial     05/21/2018 - 05/27/2018 Radiation    Palliative radiation to L medial iliac bone. 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  He presents today for consideration of cycle 3 of chemotherapy with carboplatin and cabazitaxel.    The patient is accompanied by his wife today.  She notes that he did not recover as quickly following this most recent cycle of chemotherapy.  He experienced more nausea which was relieved with the use of ondansetron.  He had occasional diarrhea for which he used Imodium as needed.  He denies any black tarry stools or bright red blood in his stools.    He is requesting a refill of gabapentin (which he ran out of yesterday).  He has been experiencing more right shoulder and arm pain over the past 3 weeks.  He states that the pain has stayed the same and not improved or worsened.  He has been applying icy hot to the area.  He continues with hydrocodone?acetaminophen 10?3 25  mg and is averaging 4 tablets a day.  He also continues with MS Contin 15 mg every evening.       Review of Systems   Constitutional: Positive for fatigue. Negative for activity change, appetite change, chills, fever and unexpected weight change.   HENT: Negative for dental problem, hearing loss, mouth sores, nosebleeds, rhinorrhea, sinus pain, sneezing, sore throat, tinnitus, trouble swallowing and voice change.    Eyes: Negative for visual disturbance.   Respiratory: Negative for cough, chest tightness, shortness of breath and wheezing.    Cardiovascular: Negative for chest pain, palpitations and leg swelling.   Gastrointestinal: Positive for diarrhea, nausea and vomiting. Negative for abdominal pain, blood in stool and constipation.   Genitourinary: Negative for decreased urine volume, difficulty urinating, dysuria, flank pain, frequency, hematuria and urgency.   Musculoskeletal: Positive for arthralgias. Negative for back pain, gait problem and myalgias.   Skin: Negative for rash and wound.   Neurological: Positive for numbness. Negative for syncope, weakness, light-headedness and headaches.   Psychiatric/Behavioral: Negative for confusion, dysphoric mood and sleep disturbance. The patient is not nervous/anxious.        Objective:         ? albuterol (VENTOLIN HFA, PROAIR HFA) 90 mcg/actuation inhaler Inhale 2 Puffs by mouth every 6 hours as needed.     ? albuterol 0.083% (PROVENTIL; VENTOLIN) 2.5 mg /3 mL (0.083 %) nebulizer solution Inhale 3 mL solution by nebulizer as directed every 4 hours as needed for Wheezing or Shortness of Breath.   ? aspirin EC 81 mg tablet Take 81 mg by mouth at bedtime daily. Take with food.   ? atorvastatin (LIPITOR) 80 mg tablet Take 80 mg by mouth at bedtime daily.   ? Carboxymethylcellulose Sodium 0.5 % drop Apply 1 drop to both eyes four times daily as needed for Dry Eyes.   ? carvedilol (COREG) 3.125 mg tablet Take one tablet by mouth twice daily with meals. Take with food.   ? cholecalciferol (VITAMIN D-3) 1,000 units tablet Take 1,000 Units by mouth twice daily.   ? cyanocobalamin (VITAMIN B-12) 100 mcg tablet Take 100 mcg by mouth daily.   ? edoxaban (SAVAYSA) 60 mg tab tablet Take one tablet by mouth daily.   ? empagliflozin (JARDIANCE) 10 mg tablet Take one tablet by mouth daily.   ? EPINEPHrine (EPIPEN) 1 mg/mL injection pen (2-Pack) Inject 0.3 mg (1 Pen) into thigh if needed for anaphylactic reaction. May repeat in 5-15 minutes if needed.  Indications: a significant type of allergic reaction called anaphylaxis   ? ferrous sulfate 325 mg (65 mg iron) tablet Take one tablet by mouth twice daily with meals.   ? furosemide (LASIX) 40 mg tablet Take one tablet by mouth twice daily. Morning and afternoon   ? gabapentin (NEURONTIN) 600 mg tablet Take one tablet by mouth every 8 hours.   ? HYDROcodone/acetaminophen (NORCO) 10/325 mg tablet Take one tablet by mouth every 6 hours as needed for Pain   ? insulin aspart U-100 (NOVOLOG FLEXPEN) 100 unit/mL (3 mL) injection PEN Inject twelve Units under the skin three times daily with meals.   ? insulin glargine (LANTUS SOLOSTAR) 100 unit/mL (3 mL) injection PEN Inject sixty Units under the skin at bedtime daily.   ? levothyroxine (SYNTHROID) 25 mcg tablet Take 25 mcg by mouth daily. Before breakfast    ? lisinopril (PRINIVIL, ZESTRIL) 2.5 mg tablet Take 1 tablet by mouth daily.   ? metFORMIN (GLUCOPHAGE) 1,000 mg tablet Take 1,000 mg  by mouth twice daily with meals.     ? morphine SR (MS CONTIN) 15 mg tablet Take one tablet by mouth at bedtime daily   ? nitroglycerin (NITROSTAT) 0.4 mg tablet Place 0.4 mg under tongue every 5 minutes as needed.     ? omeprazole DR (PRILOSEC) 20 mg capsule Take one capsule by mouth twice daily.   ? ondansetron (ZOFRAN) 8 mg tablet Take one tablet by mouth every 8 hours as needed (nausea and vomiting).   ? predniSONE (DELTASONE) 10 mg tablet Take one tablet by mouth daily with breakfast.   ? prochlorperazine maleate (COMPAZINE) 10 mg tablet Take one tablet by mouth every 6 hours as needed for Nausea or Vomiting.   ? senna (SENOKOT) 8.6 mg tablet Take 2 tablets by mouth at bedtime as needed for Constipation.   ? triamterene-hydrochlorothiazide (DYAZIDE) 37.5-25 mg capsule Take 1 capsule by mouth every morning.     Vitals:    02/15/19 0854   BP: 139/66   BP Source: Arm, Right Upper   Patient Position: Sitting   Pulse: 75   Resp: 16   Temp: 36.7 ?C (98.1 ?F)   TempSrc: Oral   SpO2: 94%   Weight: 92.4 kg (203 lb 9.6 oz)   Height: 165.4 cm (65.12)   PainSc: Zero       Body mass index is 33.76 kg/m?Marland Kitchen     Pain Score: Zero       Fatigue Scale: 0-None    Pain Addressed:  Patient to call office if pain not relieved or worsened and Current regimen working to control pain. Patient Evaluated for a Clinical Trial: Patient not eligible for a treatment trial (including not needing treatment, needs palliative care, in remission).     [place] Cooperative Oncology Group performance status is 2, Ambulatory and capable of all selfcare but unable to carry out any work activities. Up and about more than 50% of waking hours.     Physical Exam  Constitutional:       General: He is not in acute distress.     Appearance: Normal appearance. He is well-developed. He is not toxic-appearing or diaphoretic.   HENT:      Head: Normocephalic and atraumatic.      Mouth/Throat:      Comments: Wearing mask over nose and mouth  Eyes:      General: No scleral icterus.     Extraocular Movements: Extraocular movements intact.      Pupils: Pupils are equal, round, and reactive to light.   Neck:      Musculoskeletal: Normal range of motion and neck supple.   Cardiovascular:      Rate and Rhythm: Normal rate and regular rhythm.      Heart sounds: Normal heart sounds. No murmur.   Pulmonary:      Effort: Pulmonary effort is normal. No respiratory distress.      Breath sounds: Normal breath sounds. No wheezing, rhonchi or rales.   Abdominal:      General: Bowel sounds are normal. There is no distension.      Palpations: Abdomen is soft. There is no mass.      Tenderness: There is no abdominal tenderness.   Musculoskeletal: Normal range of motion.         General: No swelling.      Comments: Ambulates with 4-prong cane   Lymphadenopathy:      Cervical: No cervical adenopathy.   Skin:     General: Skin is warm  and dry.      Findings: No rash.   Neurological:      General: No focal deficit present.      Mental Status: He is alert and oriented to person, place, and time. Mental status is at baseline.   Psychiatric:         Mood and Affect: Mood normal.         Behavior: Behavior normal.         Thought Content: Thought content normal.         Judgment: Judgment normal.          Lab Results   Component Value Date/Time WBC 4.8 02/15/2019 07:48 AM    ANC 3.70 02/15/2019 07:48 AM    HGB 8.1 (L) 02/15/2019 07:48 AM    HCT 25.6 (L) 02/15/2019 07:48 AM    PLTCT 136 (L) 02/15/2019 07:48 AM     Lab Results   Component Value Date/Time    NA 141 02/15/2019 07:48 AM    K 4.0 02/15/2019 07:48 AM    BUN 20 02/15/2019 07:48 AM    CR 1.28 (H) 02/15/2019 07:48 AM    GLU 104 (H) 02/15/2019 07:48 AM    CA 9.3 02/15/2019 07:48 AM    AST 19 02/15/2019 07:48 AM    ALT 11 02/15/2019 07:48 AM    ALKPHOS 214 (H) 02/15/2019 07:48 AM     Lab Results   Component Value Date    PSA 1.82 02/15/2019    PSA 3.67 01/22/2019    PSA 3.47 12/07/2018    PSA 2.16 11/06/2018    PSA 1.39 10/12/2018          Assessment and Plan:    Alexander Holland is a 68 y.o.?gentleman with history of coronary artery disease who presents regarding evaluation and management of metastatic, castrate-resistant prostate cancer.  ?  1. 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  -Has been experiencing more fatigue following the chemotherapy as well as some mild nausea and diarrhea.  -Reviewed his labs which are stable and appropriate for proceeding with cycle 3 of carboplatin and cabazitaxel today.  -He will receive pegfilgrastim support on day 2  -Since his PSA has decreased to 1.82 ng/mL, we will hold off on obtaining restaging scans until after his fourth cycle of chemotherapy.  -Therefore, we will see the patient back in 3 weeks with repeat labs at which time he will be due for cycle 4 of carboplatin and cabazitaxel.  -Per [facility] policy, he will undergo COVID-19 testing 48 hours prior to return.    2. Pain:   ?With both mixed nociceptive and neuropathic/radicular components (neuropathic seems more prominent now).  ?Cont Gabapentin to 600 mg PO TID; refill of this sent electronically to his local pharmacy ?Cont Hydro/APAP 10/325 mg?PRN; he is currently using 4 tablets a day.  Denies needing refill at the current time.  -Continue MsContin 15 mg QHS; denies needing refill at the current time.  -He is to avoid all NSAIDS due to recent GI bleed.  -Suggested that he try topical 4% lidocaine on his right shoulder as directed and to contact us if the pain persists or worsens.  ?  3. 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  ?  No treatment indicated except for Gabapentin being used for radicular LLE pain.  ?  5. Fatigue:  ?Due to cancer, continue energy conservation  ?  6.?Hypoxia:  ?Improved; continue to monitor    7.  Nausea.  -Continue with ondansetron every 8 hours prn nausea    8.  Diarrhea.  -Continue imodium prn; prescription strength imodium is 2 tablets after first loose stool and then one additional tablet after each subsequent loose stool up to 8 tablets/24 hours max.  ?  [employer], PA-C  ?  Supervising physician:  Clent Jacks, MD

## 2019-02-16 ENCOUNTER — Encounter: Admit: 2019-02-16 | Discharge: 2019-02-16 | Payer: MEDICARE

## 2019-02-16 DIAGNOSIS — C61 Malignant neoplasm of prostate: Secondary | ICD-10-CM

## 2019-02-16 MED ORDER — PEGFILGRASTIM-CBQV 6 MG/0.6 ML SC SYRG
6 mg | Freq: Once | SUBCUTANEOUS | 0 refills | Status: CP
Start: 2019-02-16 — End: ?
  Administered 2019-02-16: 18:00:00 6 mg via SUBCUTANEOUS

## 2019-02-16 NOTE — Patient Instructions
[place]  Discharge Instructions      LIO GUESS  02/16/2019    Treatment Received Today:  Ellen Henri            Discharge Instructions  Call immediately to report the following:   Uncontrolled nausea or vomiting, pain, or bleeding   Temperature of 100.5 F or greater or any sign/symptom of infection (warmth, redness, tenderness)   Painful mouth or difficulty swallowing   Diarrhea    Swelling of arms or legs   Rash     Post-Treatment Directions:   Use mouth rinses after meals and at bedtime.  Use a non-alcohol commercial brand rinse   or mild salt water/baking soda rinse.     Drink 8-10 glasses of fluids daily.   Try to exercise daily to decrease fatigue.      Medication Instructions  If there are any specific medication instructions they are written below          Phone Numbers  [facility] Phone # [phone]    (Answered 24 hrs a day)    For up to date information on the COVID-19 virus, visit the [facility] website. http://[email or web address]-smith.org/   General supportive care during cold and flu season and infection prevention reminders:    o Wash hands often with soap and water for at least 20 seconds   o Cover your mouth and nose   o Social distancing: try to maintain 6 feet between you and other people   o Stay home if sick and symptoms mild or manageable?   If you must be around people wear a mask     If you are having symptoms of a lower respiratory infection (cough, shortness of breath) and/or fever AND either traveled in last 30 days (internationally or to region of exposure) OR known exposure to patient with COVID19:     o Call your primary care provider for questions or health needs.    Tell your doctor about your recent travel and your symptoms     o In a medical emergency, call 911 or go to the nearest emergency room.

## 2019-02-16 NOTE — Progress Notes
Patient presents for Udenyca injection. Patient denies questions about plan of care, denies concerning symptoms. Tolerated injection to subQ tissue of R arm well, left treatment area ambulatory and in baseline condition.

## 2019-03-13 ENCOUNTER — Encounter: Admit: 2019-03-13 | Discharge: 2019-03-13 | Payer: MEDICARE

## 2019-03-13 DIAGNOSIS — Z1159 Encounter for screening for other viral diseases: Secondary | ICD-10-CM

## 2019-03-16 ENCOUNTER — Encounter: Admit: 2019-03-16 | Discharge: 2019-03-16 | Payer: MEDICARE

## 2019-03-16 MED ORDER — MORPHINE 15 MG PO TBER
15 mg | ORAL_TABLET | Freq: Every evening | ORAL | 0 refills | 7.00000 days | Status: DC
Start: 2019-03-16 — End: 2019-04-05

## 2019-03-16 MED ORDER — HYDROCODONE-ACETAMINOPHEN 10-325 MG PO TAB
1 | ORAL_TABLET | ORAL | 0 refills | 15.00000 days | Status: DC | PRN
Start: 2019-03-16 — End: 2019-04-05

## 2019-03-17 ENCOUNTER — Encounter: Admit: 2019-03-17 | Discharge: 2019-03-17 | Payer: MEDICARE

## 2019-03-17 ENCOUNTER — Ambulatory Visit: Admit: 2019-03-17 | Discharge: 2019-03-18 | Payer: MEDICARE

## 2019-03-17 DIAGNOSIS — K219 Gastro-esophageal reflux disease without esophagitis: Secondary | ICD-10-CM

## 2019-03-17 DIAGNOSIS — E119 Type 2 diabetes mellitus without complications: Secondary | ICD-10-CM

## 2019-03-17 DIAGNOSIS — J45909 Unspecified asthma, uncomplicated: Secondary | ICD-10-CM

## 2019-03-17 DIAGNOSIS — R0600 Dyspnea, unspecified: Secondary | ICD-10-CM

## 2019-03-17 DIAGNOSIS — C61 Malignant neoplasm of prostate: Secondary | ICD-10-CM

## 2019-03-17 DIAGNOSIS — B0229 Other postherpetic nervous system involvement: Secondary | ICD-10-CM

## 2019-03-17 DIAGNOSIS — J449 Chronic obstructive pulmonary disease, unspecified: Secondary | ICD-10-CM

## 2019-03-17 DIAGNOSIS — D649 Anemia, unspecified: Secondary | ICD-10-CM

## 2019-03-17 DIAGNOSIS — E039 Hypothyroidism, unspecified: Secondary | ICD-10-CM

## 2019-03-17 DIAGNOSIS — I509 Heart failure, unspecified: Secondary | ICD-10-CM

## 2019-03-17 DIAGNOSIS — E785 Hyperlipidemia, unspecified: Secondary | ICD-10-CM

## 2019-03-17 DIAGNOSIS — I214 Non-ST elevation (NSTEMI) myocardial infarction: Secondary | ICD-10-CM

## 2019-03-17 DIAGNOSIS — M1990 Unspecified osteoarthritis, unspecified site: Secondary | ICD-10-CM

## 2019-03-17 DIAGNOSIS — N189 Chronic kidney disease, unspecified: Secondary | ICD-10-CM

## 2019-03-17 DIAGNOSIS — E669 Obesity, unspecified: Secondary | ICD-10-CM

## 2019-03-17 DIAGNOSIS — G4733 Obstructive sleep apnea (adult) (pediatric): Secondary | ICD-10-CM

## 2019-03-17 DIAGNOSIS — I10 Essential (primary) hypertension: Secondary | ICD-10-CM

## 2019-03-17 DIAGNOSIS — I2510 Atherosclerotic heart disease of native coronary artery without angina pectoris: Secondary | ICD-10-CM

## 2019-03-17 DIAGNOSIS — Z87891 Personal history of nicotine dependence: Secondary | ICD-10-CM

## 2019-03-17 DIAGNOSIS — E1140 Type 2 diabetes mellitus with diabetic neuropathy, unspecified: Principal | ICD-10-CM

## 2019-03-17 DIAGNOSIS — A4902 Methicillin resistant Staphylococcus aureus infection, unspecified site: Secondary | ICD-10-CM

## 2019-03-17 NOTE — Patient Instructions
My nurse, Jan, can be reached at [phone].     Please let us know if you have any questions.     Great to see you today!    - Raeley Gilmore Kingsley, PA-C

## 2019-03-18 ENCOUNTER — Encounter: Admit: 2019-03-18 | Discharge: 2019-03-18 | Payer: MEDICARE

## 2019-03-18 DIAGNOSIS — A4902 Methicillin resistant Staphylococcus aureus infection, unspecified site: Secondary | ICD-10-CM

## 2019-03-18 DIAGNOSIS — E039 Hypothyroidism, unspecified: Secondary | ICD-10-CM

## 2019-03-18 DIAGNOSIS — B0229 Other postherpetic nervous system involvement: Secondary | ICD-10-CM

## 2019-03-18 DIAGNOSIS — J45909 Unspecified asthma, uncomplicated: Secondary | ICD-10-CM

## 2019-03-18 DIAGNOSIS — I214 Non-ST elevation (NSTEMI) myocardial infarction: Secondary | ICD-10-CM

## 2019-03-18 DIAGNOSIS — E119 Type 2 diabetes mellitus without complications: Secondary | ICD-10-CM

## 2019-03-18 DIAGNOSIS — E669 Obesity, unspecified: Secondary | ICD-10-CM

## 2019-03-18 DIAGNOSIS — C61 Malignant neoplasm of prostate: Secondary | ICD-10-CM

## 2019-03-18 DIAGNOSIS — I509 Heart failure, unspecified: Secondary | ICD-10-CM

## 2019-03-18 DIAGNOSIS — R0600 Dyspnea, unspecified: Secondary | ICD-10-CM

## 2019-03-18 DIAGNOSIS — K219 Gastro-esophageal reflux disease without esophagitis: Secondary | ICD-10-CM

## 2019-03-18 DIAGNOSIS — N189 Chronic kidney disease, unspecified: Secondary | ICD-10-CM

## 2019-03-18 DIAGNOSIS — I10 Essential (primary) hypertension: Secondary | ICD-10-CM

## 2019-03-18 DIAGNOSIS — D649 Anemia, unspecified: Secondary | ICD-10-CM

## 2019-03-18 DIAGNOSIS — I2510 Atherosclerotic heart disease of native coronary artery without angina pectoris: Secondary | ICD-10-CM

## 2019-03-18 DIAGNOSIS — Z87891 Personal history of nicotine dependence: Secondary | ICD-10-CM

## 2019-03-18 DIAGNOSIS — E785 Hyperlipidemia, unspecified: Secondary | ICD-10-CM

## 2019-03-18 DIAGNOSIS — J449 Chronic obstructive pulmonary disease, unspecified: Secondary | ICD-10-CM

## 2019-03-18 DIAGNOSIS — G4733 Obstructive sleep apnea (adult) (pediatric): Secondary | ICD-10-CM

## 2019-03-18 DIAGNOSIS — M1990 Unspecified osteoarthritis, unspecified site: Secondary | ICD-10-CM

## 2019-03-31 ENCOUNTER — Ambulatory Visit: Admit: 2019-03-31 | Discharge: 2019-03-31 | Payer: MEDICARE

## 2019-03-31 ENCOUNTER — Encounter: Admit: 2019-03-31 | Discharge: 2019-03-31 | Payer: MEDICARE

## 2019-03-31 DIAGNOSIS — C61 Malignant neoplasm of prostate: Secondary | ICD-10-CM

## 2019-03-31 MED ORDER — SODIUM CHLORIDE 0.9 % IJ SOLN
50 mL | Freq: Once | INTRAVENOUS | 0 refills | Status: CP
Start: 2019-03-31 — End: ?
  Administered 2019-03-31: 16:00:00 50 mL via INTRAVENOUS

## 2019-03-31 MED ORDER — RP DX TC-99M MEDRONATE MCI
25 | Freq: Once | INTRAVENOUS | 0 refills | Status: CP
Start: 2019-03-31 — End: ?
  Administered 2019-03-31: 16:00:00 25.9 via INTRAVENOUS

## 2019-03-31 MED ORDER — IOHEXOL 350 MG IODINE/ML IV SOLN
100 mL | Freq: Once | INTRAVENOUS | 0 refills | Status: CP
Start: 2019-03-31 — End: ?
  Administered 2019-03-31: 16:00:00 100 mL via INTRAVENOUS

## 2019-04-02 ENCOUNTER — Encounter: Admit: 2019-04-02 | Discharge: 2019-04-02 | Payer: MEDICARE

## 2019-04-02 MED ORDER — PALONOSETRON 0.25 MG/5 ML IV SOLN
.25 mg | Freq: Once | INTRAVENOUS | 0 refills | Status: CN
Start: 2019-04-02 — End: ?

## 2019-04-02 MED ORDER — DEXAMETHASONE IVPB
12 mg | Freq: Once | INTRAVENOUS | 0 refills | Status: CN
Start: 2019-04-02 — End: ?

## 2019-04-02 MED ORDER — FAMOTIDINE (PF) 20 MG/2 ML IV SOLN
20 mg | Freq: Once | INTRAVENOUS | 0 refills | Status: CN
Start: 2019-04-02 — End: ?

## 2019-04-02 MED ORDER — CABAZITAXEL IVPB
20 mg/m2 | Freq: Once | INTRAVENOUS | 0 refills | Status: CN
Start: 2019-04-02 — End: ?

## 2019-04-02 MED ORDER — APREPITANT 7.2 MG/ML IV EMUL
130 mg | Freq: Once | INTRAVENOUS | 0 refills | Status: CN
Start: 2019-04-02 — End: ?

## 2019-04-02 MED ORDER — DIPHENHYDRAMINE HCL 50 MG/ML IJ SOLN
25 mg | Freq: Once | INTRAVENOUS | 0 refills | Status: CN
Start: 2019-04-02 — End: ?

## 2019-04-03 ENCOUNTER — Encounter: Admit: 2019-04-03 | Discharge: 2019-04-04 | Payer: MEDICARE

## 2019-04-03 DIAGNOSIS — Z1159 Encounter for screening for other viral diseases: Secondary | ICD-10-CM

## 2019-04-05 ENCOUNTER — Encounter: Admit: 2019-04-05 | Discharge: 2019-04-05 | Payer: MEDICARE

## 2019-04-05 DIAGNOSIS — N189 Chronic kidney disease, unspecified: Secondary | ICD-10-CM

## 2019-04-05 DIAGNOSIS — I2510 Atherosclerotic heart disease of native coronary artery without angina pectoris: Secondary | ICD-10-CM

## 2019-04-05 DIAGNOSIS — A4902 Methicillin resistant Staphylococcus aureus infection, unspecified site: Secondary | ICD-10-CM

## 2019-04-05 DIAGNOSIS — B0229 Other postherpetic nervous system involvement: Secondary | ICD-10-CM

## 2019-04-05 DIAGNOSIS — G4733 Obstructive sleep apnea (adult) (pediatric): Secondary | ICD-10-CM

## 2019-04-05 DIAGNOSIS — E785 Hyperlipidemia, unspecified: Secondary | ICD-10-CM

## 2019-04-05 DIAGNOSIS — E1140 Type 2 diabetes mellitus with diabetic neuropathy, unspecified: Secondary | ICD-10-CM

## 2019-04-05 DIAGNOSIS — E039 Hypothyroidism, unspecified: Secondary | ICD-10-CM

## 2019-04-05 DIAGNOSIS — C61 Malignant neoplasm of prostate: Secondary | ICD-10-CM

## 2019-04-05 DIAGNOSIS — J45909 Unspecified asthma, uncomplicated: Secondary | ICD-10-CM

## 2019-04-05 DIAGNOSIS — E119 Type 2 diabetes mellitus without complications: Secondary | ICD-10-CM

## 2019-04-05 DIAGNOSIS — J449 Chronic obstructive pulmonary disease, unspecified: Secondary | ICD-10-CM

## 2019-04-05 DIAGNOSIS — G893 Neoplasm related pain (acute) (chronic): Secondary | ICD-10-CM

## 2019-04-05 DIAGNOSIS — D649 Anemia, unspecified: Secondary | ICD-10-CM

## 2019-04-05 DIAGNOSIS — K219 Gastro-esophageal reflux disease without esophagitis: Secondary | ICD-10-CM

## 2019-04-05 DIAGNOSIS — I10 Essential (primary) hypertension: Secondary | ICD-10-CM

## 2019-04-05 DIAGNOSIS — M1990 Unspecified osteoarthritis, unspecified site: Secondary | ICD-10-CM

## 2019-04-05 DIAGNOSIS — C778 Secondary and unspecified malignant neoplasm of lymph nodes of multiple regions: Secondary | ICD-10-CM

## 2019-04-05 DIAGNOSIS — Z87891 Personal history of nicotine dependence: Secondary | ICD-10-CM

## 2019-04-05 DIAGNOSIS — C7951 Secondary malignant neoplasm of bone: Secondary | ICD-10-CM

## 2019-04-05 DIAGNOSIS — I509 Heart failure, unspecified: Secondary | ICD-10-CM

## 2019-04-05 DIAGNOSIS — I214 Non-ST elevation (NSTEMI) myocardial infarction: Secondary | ICD-10-CM

## 2019-04-05 DIAGNOSIS — E669 Obesity, unspecified: Secondary | ICD-10-CM

## 2019-04-05 DIAGNOSIS — R0600 Dyspnea, unspecified: Secondary | ICD-10-CM

## 2019-04-05 LAB — HEMOGLOBIN A1C: Lab: 6.6 % — ABNORMAL HIGH (ref 4.0–6.0)

## 2019-04-05 MED ORDER — HYDROCODONE-ACETAMINOPHEN 10-325 MG PO TAB
1 | ORAL_TABLET | ORAL | 0 refills | 15.00000 days | Status: AC | PRN
Start: 2019-04-05 — End: ?

## 2019-04-05 MED ORDER — CARVEDILOL 3.125 MG PO TAB
3.125 mg | ORAL_TABLET | Freq: Two times a day (BID) | ORAL | 11 refills | 90.00000 days | Status: AC
Start: 2019-04-05 — End: ?

## 2019-04-05 MED ORDER — MORPHINE 15 MG PO TBER
15 mg | ORAL_TABLET | Freq: Two times a day (BID) | ORAL | 0 refills | 7.00000 days | Status: AC
Start: 2019-04-05 — End: ?

## 2019-04-06 ENCOUNTER — Encounter: Admit: 2019-04-06 | Discharge: 2019-04-06 | Payer: MEDICARE

## 2019-04-06 NOTE — Telephone Encounter
Called patient to notify him of his new appointment time on 04/22/2019. Sent to answering machine where I left a message confirming date and time; provided call back number as well.

## 2019-04-12 ENCOUNTER — Encounter: Admit: 2019-04-12 | Discharge: 2019-04-12 | Payer: MEDICARE

## 2019-04-12 NOTE — Telephone Encounter
Patient wife, Alexander Holland call to request medical records be sent to [facility] Dr Hermina Staggers.   Call back to Wife to indicate records would be faxed as requested today . Instructed Wife to remind PCP at appointment to send office note   Call to [facility] at [phone] extension [phone] ( [facility]) to request fax number to send records .   Records faxed to [phone] w/ confirmation

## 2019-04-22 ENCOUNTER — Encounter: Admit: 2019-04-22 | Discharge: 2019-04-22 | Payer: MEDICARE

## 2019-05-12 ENCOUNTER — Encounter: Admit: 2019-05-12 | Discharge: 2019-05-12 | Payer: MEDICARE

## 2019-05-12 NOTE — Telephone Encounter
I called patient and informed him and his wife that he has a telehealth appointment on 2/1 at 3:00 with Dr. Dondra Spry.  Patient and wife verbalized understanding.

## 2019-05-17 ENCOUNTER — Encounter: Admit: 2019-05-17 | Discharge: 2019-05-17 | Payer: MEDICARE

## 2019-05-27 ENCOUNTER — Encounter: Admit: 2019-05-27 | Discharge: 2019-05-27 | Payer: MEDICARE

## 2019-06-07 ENCOUNTER — Encounter: Admit: 2019-06-07 | Discharge: 2019-06-07 | Payer: MEDICARE

## 2019-07-06 ENCOUNTER — Encounter: Admit: 2019-07-06 | Discharge: 2019-07-06 | Payer: MEDICARE

## 2019-07-06 NOTE — Telephone Encounter
Daughter called to report pt has declined significantly and he will not be able to do [facility] 4/1. She reports he fell last week and has a spiral fracture of his right humoral head and since then they are not sure if he has had a stroke. Pt can't walk and his balance is really off. He is more agitated and not very verbal. Daughter reports they have been able to keep him comfortable with hospice and is happy with the care. Dr Dondra Spry updated.

## 2019-07-15 ENCOUNTER — Encounter: Admit: 2019-07-15 | Discharge: 2019-07-15 | Payer: MEDICARE

## 2019-08-03 NOTE — Progress Notes
POST-MORTEM DOCUMENTATION    Name: Alexander Holland        MRN: [id number]          DOB: May 11, 1950          Age: 69 y.o.  Admission Date: (Not on file)             LOS: 0 days      Date of death if known:  26-Aug-2019  Location of death, if known:Home  How were you notified?  Phone  Who notified us of death? Daughter Clair Gulling    Was hospice involved? Yes  [facility]  Date of hospice admission, if known: unknown    Other information:

## 2019-08-14 DEATH — deceased

## 2021-05-30 ENCOUNTER — Encounter: Admit: 2021-05-30 | Discharge: 2021-05-30 | Payer: MEDICARE
